# Patient Record
Sex: Female | Born: 1985 | Race: Black or African American | Hispanic: No | Smoking: Former smoker
Health system: Southern US, Community
[De-identification: ages and names within clinical notes are randomized; demographics above are authoritative.]

## PROBLEM LIST (undated history)

## (undated) ENCOUNTER — Inpatient Hospital Stay (HOSPITAL_COMMUNITY): Payer: Self-pay

## (undated) DIAGNOSIS — F329 Major depressive disorder, single episode, unspecified: Secondary | ICD-10-CM

## (undated) DIAGNOSIS — K5900 Constipation, unspecified: Secondary | ICD-10-CM

## (undated) DIAGNOSIS — F32A Depression, unspecified: Secondary | ICD-10-CM

## (undated) DIAGNOSIS — M2550 Pain in unspecified joint: Secondary | ICD-10-CM

## (undated) DIAGNOSIS — M25559 Pain in unspecified hip: Secondary | ICD-10-CM

## (undated) DIAGNOSIS — M549 Dorsalgia, unspecified: Secondary | ICD-10-CM

## (undated) DIAGNOSIS — R87619 Unspecified abnormal cytological findings in specimens from cervix uteri: Secondary | ICD-10-CM

## (undated) DIAGNOSIS — R0602 Shortness of breath: Secondary | ICD-10-CM

## (undated) DIAGNOSIS — Z789 Other specified health status: Secondary | ICD-10-CM

## (undated) DIAGNOSIS — M25569 Pain in unspecified knee: Secondary | ICD-10-CM

## (undated) DIAGNOSIS — IMO0002 Reserved for concepts with insufficient information to code with codable children: Secondary | ICD-10-CM

## (undated) DIAGNOSIS — E669 Obesity, unspecified: Secondary | ICD-10-CM

## (undated) DIAGNOSIS — K831 Obstruction of bile duct: Secondary | ICD-10-CM

## (undated) HISTORY — DX: Dorsalgia, unspecified: M54.9

## (undated) HISTORY — DX: Depression, unspecified: F32.A

## (undated) HISTORY — DX: Obstruction of bile duct: K83.1

## (undated) HISTORY — DX: Shortness of breath: R06.02

## (undated) HISTORY — DX: Constipation, unspecified: K59.00

## (undated) HISTORY — DX: Pain in unspecified joint: M25.50

## (undated) HISTORY — PX: ABDOMINAL SURGERY: SHX537

## (undated) HISTORY — DX: Pain in unspecified knee: M25.569

## (undated) HISTORY — PX: NO PAST SURGERIES: SHX2092

## (undated) HISTORY — DX: Pain in unspecified hip: M25.559

---
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
## 2006-09-09 ENCOUNTER — Emergency Department (HOSPITAL_COMMUNITY): Admission: EM | Admit: 2006-09-09 | Discharge: 2006-09-10 | Payer: Self-pay | Admitting: Emergency Medicine

## 2006-10-31 ENCOUNTER — Emergency Department (HOSPITAL_COMMUNITY): Admission: EM | Admit: 2006-10-31 | Discharge: 2006-11-01 | Payer: Self-pay | Admitting: Emergency Medicine

## 2009-07-11 HISTORY — PX: COLPOSCOPY: SHX161

## 2012-04-10 LAB — HM PAP SMEAR: HM Pap smear: NORMAL

## 2012-07-11 NOTE — L&D Delivery Note (Signed)
Delivery Note At 12:43 PM a viable female was delivered via Vaginal, Spontaneous Delivery (Presentation: ; Occiput Anterior).  APGAR: 9, 9; weight .   Placenta status: Intact, Spontaneous.  Cord: 3 vessels with the following complications: None.  Cord pH: not done  Anesthesia: None  Episiotomy: None Lacerations: 1st degree;Periurethral Suture Repair: not indicated Est. Blood Loss (mL): 250  Mom to postpartum.  Baby to nursery-stable. Brenda Navarro, Brenda Navarro 04/02/2013, 1:52 PM

## 2012-09-11 LAB — OB RESULTS CONSOLE GC/CHLAMYDIA
Chlamydia: NEGATIVE
Gonorrhea: NEGATIVE

## 2012-10-11 ENCOUNTER — Encounter: Payer: Self-pay | Admitting: Obstetrics

## 2012-10-19 ENCOUNTER — Ambulatory Visit (INDEPENDENT_AMBULATORY_CARE_PROVIDER_SITE_OTHER): Admitting: Obstetrics

## 2012-10-19 ENCOUNTER — Encounter: Payer: Self-pay | Admitting: Obstetrics

## 2012-10-19 VITALS — BP 123/79 | Temp 98.2°F | Ht 64.0 in | Wt 230.0 lb

## 2012-10-19 DIAGNOSIS — Z3480 Encounter for supervision of other normal pregnancy, unspecified trimester: Secondary | ICD-10-CM

## 2012-10-19 NOTE — Progress Notes (Signed)
Doing well 

## 2012-10-19 NOTE — Progress Notes (Signed)
Pulse: 75

## 2012-10-20 LAB — HIV ANTIBODY (ROUTINE TESTING W REFLEX): HIV: NONREACTIVE

## 2012-10-20 LAB — CULTURE, OB URINE
Colony Count: NO GROWTH
Organism ID, Bacteria: NO GROWTH

## 2012-10-20 LAB — VITAMIN D 25 HYDROXY (VIT D DEFICIENCY, FRACTURES): Vit D, 25-Hydroxy: 19 ng/mL — ABNORMAL LOW (ref 30–89)

## 2012-10-22 LAB — AFP, QUAD SCREEN
AFP: 33.9 IU/mL
Age Alone: 1:1030 {titer}
Curr Gest Age: 16.6 wks.days
Down Syndrome Scr Risk Est: 1:13300 {titer}
HCG, Total: 16561 m[IU]/mL
INH: 220 pg/mL
Interpretation-AFP: NEGATIVE
MoM for AFP: 1.42
MoM for INH: 1.5
MoM for hCG: 1.09
Open Spina bifida: NEGATIVE
Osb Risk: 1:3550 {titer}
Tri 18 Scr Risk Est: NEGATIVE
Trisomy 18 (Edward) Syndrome Interp.: 1:12500 {titer}
uE3 Mom: 0.58
uE3 Value: 0.3 ng/mL

## 2012-10-22 LAB — OBSTETRIC PANEL
Antibody Screen: NEGATIVE
Basophils Absolute: 0 10*3/uL (ref 0.0–0.1)
Basophils Relative: 0 % (ref 0–1)
Eosinophils Absolute: 0.1 10*3/uL (ref 0.0–0.7)
Eosinophils Relative: 1 % (ref 0–5)
HCT: 38.4 % (ref 36.0–46.0)
Hemoglobin: 12.5 g/dL (ref 12.0–15.0)
Hepatitis B Surface Ag: NEGATIVE
Lymphocytes Relative: 26 % (ref 12–46)
Lymphs Abs: 2 10*3/uL (ref 0.7–4.0)
MCH: 27.8 pg (ref 26.0–34.0)
MCHC: 32.6 g/dL (ref 30.0–36.0)
MCV: 85.3 fL (ref 78.0–100.0)
Monocytes Absolute: 0.5 10*3/uL (ref 0.1–1.0)
Monocytes Relative: 6 % (ref 3–12)
Neutro Abs: 5.3 10*3/uL (ref 1.7–7.7)
Neutrophils Relative %: 67 % (ref 43–77)
Platelets: 289 10*3/uL (ref 150–400)
RBC: 4.5 MIL/uL (ref 3.87–5.11)
RDW: 14 % (ref 11.5–15.5)
Rh Type: POSITIVE
Rubella: 3.35 Index — ABNORMAL HIGH (ref <0.90)
WBC: 7.9 10*3/uL (ref 4.0–10.5)

## 2012-10-22 LAB — VARICELLA ZOSTER ANTIBODY, IGG: Varicella IgG: 236.7 Index — ABNORMAL HIGH (ref <135.00)

## 2012-10-24 ENCOUNTER — Encounter (HOSPITAL_COMMUNITY): Payer: Self-pay | Admitting: Family Medicine

## 2012-10-24 ENCOUNTER — Emergency Department (HOSPITAL_COMMUNITY)

## 2012-10-24 ENCOUNTER — Emergency Department (HOSPITAL_COMMUNITY)
Admission: EM | Admit: 2012-10-24 | Discharge: 2012-10-24 | Disposition: A | Discharge status: Home / Self Care | Attending: Emergency Medicine | Admitting: Emergency Medicine

## 2012-10-24 DIAGNOSIS — Y9289 Other specified places as the place of occurrence of the external cause: Secondary | ICD-10-CM | POA: Insufficient documentation

## 2012-10-24 DIAGNOSIS — R269 Unspecified abnormalities of gait and mobility: Secondary | ICD-10-CM | POA: Insufficient documentation

## 2012-10-24 DIAGNOSIS — R209 Unspecified disturbances of skin sensation: Secondary | ICD-10-CM | POA: Insufficient documentation

## 2012-10-24 DIAGNOSIS — Z3490 Encounter for supervision of normal pregnancy, unspecified, unspecified trimester: Secondary | ICD-10-CM

## 2012-10-24 DIAGNOSIS — Z79899 Other long term (current) drug therapy: Secondary | ICD-10-CM | POA: Insufficient documentation

## 2012-10-24 DIAGNOSIS — X500XXA Overexertion from strenuous movement or load, initial encounter: Secondary | ICD-10-CM | POA: Insufficient documentation

## 2012-10-24 DIAGNOSIS — S93409A Sprain of unspecified ligament of unspecified ankle, initial encounter: Secondary | ICD-10-CM | POA: Insufficient documentation

## 2012-10-24 DIAGNOSIS — S3981XA Other specified injuries of abdomen, initial encounter: Secondary | ICD-10-CM | POA: Insufficient documentation

## 2012-10-24 DIAGNOSIS — W108XXA Fall (on) (from) other stairs and steps, initial encounter: Secondary | ICD-10-CM | POA: Insufficient documentation

## 2012-10-24 DIAGNOSIS — O9989 Other specified diseases and conditions complicating pregnancy, childbirth and the puerperium: Secondary | ICD-10-CM | POA: Insufficient documentation

## 2012-10-24 DIAGNOSIS — Y939 Activity, unspecified: Secondary | ICD-10-CM | POA: Insufficient documentation

## 2012-10-24 DIAGNOSIS — R110 Nausea: Secondary | ICD-10-CM | POA: Insufficient documentation

## 2012-10-24 LAB — URINE MICROSCOPIC-ADD ON

## 2012-10-24 LAB — URINALYSIS, ROUTINE W REFLEX MICROSCOPIC
Bilirubin Urine: NEGATIVE
Glucose, UA: NEGATIVE mg/dL
Ketones, ur: 15 mg/dL — AB
Leukocytes, UA: NEGATIVE
Nitrite: NEGATIVE
Protein, ur: NEGATIVE mg/dL
Specific Gravity, Urine: 1.031 — ABNORMAL HIGH (ref 1.005–1.030)
Urobilinogen, UA: 1 mg/dL (ref 0.0–1.0)
pH: 5.5 (ref 5.0–8.0)

## 2012-10-24 NOTE — ED Notes (Signed)
Patient declined wet prep and GC/Chlamydia

## 2012-10-24 NOTE — ED Provider Notes (Signed)
History     CSN: [id number]  Arrival date & time 10/24/12  1403   First MD Initiated Contact with Patient 10/24/12 1550      Chief Complaint  Patient presents with  . Fall    (Consider location/radiation/quality/duration/timing/severity/associated sxs/prior treatment) HPI  Pt is a 27 yo F G2P1 presenting after tripping down 2-3 stairs and landing on her abdomen, also injuring her right ankle during the fall. Pt is currently [redacted]wks pregnant w/o any difficulty during her pregnancy thus far. Pt states the ankle was the initial reason for coming to the ED she had sharp pain on the lateral portion of her foot and was unable to ambulate on the ankle immediately after the incident. She has pain in her fourth and fifth toes w/ some numbness in the tip of her fourth toe. Weight bearing aggravates the pain from 7/10 to 9/10. Rest and elevation alleviate pain. No prior history of injury to ankle. While waiting in the ED waiting room patient states she felt some light cramping in her lower abdomen w/o radiation. Has some minor associated nausea. Denies any vaginal bleeding or vaginal pain. Denies LOC, headache, fevers, chills, vomiting, weakness, lightheadedness. Denies any safety issues or concerns at home. Feels safe.   History reviewed. No pertinent past medical history.  History reviewed. No pertinent past surgical history.  History reviewed. No pertinent family history.  History  Substance Use Topics  . Smoking status: Not on file  . Smokeless tobacco: Not on file  . Alcohol Use: Not on file    OB History   Grav Para Term Preterm Abortions TAB SAB Ect Mult Living   2 1 1       1       Review of Systems  Constitutional: Negative.   HENT: Negative.   Eyes: Negative.   Respiratory: Negative.   Cardiovascular: Negative.   Gastrointestinal: Positive for nausea.  Genitourinary: Negative.   Musculoskeletal: Positive for gait problem.  Skin:       Ankle bruising  Neurological:  Negative.   Psychiatric/Behavioral: Negative.     Allergies  Review of patient's allergies indicates no known allergies.  Home Medications   Current Outpatient Rx  Name  Route  Sig  Dispense  Refill  . Prenatal Vit-Fe Fumarate-FA (PRENATAL FORMULA PO)   Oral   Take 1 tablet by mouth daily.           BP 137/69  Pulse 104  Temp(Src) 98.3 F (36.8 C)  Resp 18  SpO2 100%  Physical Exam  Constitutional: She is oriented to person, place, and time. She appears well-developed and well-nourished. No distress.  HENT:  Head: Normocephalic and atraumatic.  Eyes: Conjunctivae are normal. Pupils are equal, round, and reactive to light.  Neck: Neck supple.  Cardiovascular: Normal rate, regular rhythm, normal heart sounds and intact distal pulses.   Abdominal: Soft. Bowel sounds are normal.  Musculoskeletal:       Right ankle: She exhibits decreased range of motion, swelling and ecchymosis. She exhibits no deformity, no laceration and normal pulse. Tenderness. No head of 5th metatarsal and no proximal fibula tenderness found. Achilles tendon normal.       Left ankle: Normal.       Feet:  Neurological: She is alert and oriented to person, place, and time.  Skin: Skin is warm and dry. She is not diaphoretic.   Exam performed by Francee Piccolo L,  exam chaperoned Date: 10/24/2012 Pelvic exam: normal external genitalia without evidence of  trauma. VULVA: normal appearing vulva with no masses, tenderness or lesion. VAGINA: normal appearing vagina with normal color and discharge, no lesions. CERVIX: normal appearing cervix without lesions, cervical motion tenderness absent, cervical os closed with out purulent discharge.   ADNEXA: normal adnexa in size, nontender and no masses UTERUS: uterus is normal size for [redacted] weeks pregnant, shape, consistency and nontender.   GC/Chlymadia and Wet prep declined per patient as she had them done and other routine testing done at last Ob/Gyn  appointment.   ED Course  Procedures (including critical care time)  Dr. Clearance Coots of [facility] contacted about patient's ED visit and notified of US findings. Advised to have patient follow up this week.   Labs Reviewed - No data to display Dg Ankle Complete Right  10/24/2012  *RADIOLOGY REPORT*  Clinical Data: Twisting injury, pain.  RIGHT ANKLE - COMPLETE 3+ VIEW  Comparison: None.  Findings: Imaged bones, joints and soft tissues appear normal.  IMPRESSION: Negative exam.   Original Report Authenticated By: Holley Dexter, M.D.    US Ob Limited  10/24/2012  *RADIOLOGY REPORT*  Clinical Data: Cramping abdominal pain after fall.  LIMITED OBSTETRIC ULTRASOUND  Number of Fetuses: 1 Heart Rate: 152 bpm Movement: Yes Presentation: Cephalic Placental Location: Anterior Previa: No Amniotic Fluid (Subjective): Normal  Vertical Pocket 4.9 cm  BPD:  3.94 cm     18 w  0 d         EDC:  03/27/2013  IMPRESSION: Intrauterine pregnancy, 18 weeks and 0 days gestation.  No complications are visible.  Recommend followup with non-emergent complete OB 14+ wk US examination for fetal biometric evaluation and anatomic survey if not already performed.   Original Report Authenticated By: Francene Boyers, M.D.    Dg Foot Complete Right  10/24/2012  *RADIOLOGY REPORT*  Clinical Data: Pain and swelling of the lateral aspect of the right foot after the patient fell down stairs today.  RIGHT FOOT COMPLETE - 3+ VIEW  Comparison: None.  Findings: There is no fracture, dislocation, or joint effusion.  No arthritis.  IMPRESSION: Normal exam.   Original Report Authenticated By: Francene Boyers, M.D.      1. Ankle sprain and strain, right, initial encounter   2. Pregnancy       MDM  Patient is a 27 yo F G2P1 presenting to ED after a mechanical fall down the stairs this afternoon. Patient states she twisted her ankle and fell on her abdomen. No vaginal bleeding. Pelvic exam wnl. US showed no complications.  Obstetrician was notified of visit. X-ray showed no acute bony injuries to ankle. Pt adivsed to stay off ankle. Given crutches and RICE protocol. Advised to follow up with PCP and OB/GYN.Patient agreeable to plan. Patient is stable at time of discharge. Patient d/w with Dr. Clarene Duke, agrees with plan.           Jeannetta Ellis, PA-C 10/25/12 2028

## 2012-10-24 NOTE — ED Notes (Addendum)
Per pt sts tripped and fell down a few stairs. sts right ankle pain. Denies abdominal pain but wants to be checked out.  Pt is [redacted] weeks pregnant

## 2012-10-24 NOTE — Progress Notes (Signed)
Orthopedic Tech Progress Note Patient Details:  Brenda Navarro Dec 05, 1985 [id number]  Ortho Devices Type of Ortho Device: CAM Abbasi;Crutches Ortho Device/Splint Interventions: Ordered;Application;Adjustment   Jennye Moccasin 10/24/2012, 6:15 PM

## 2012-10-24 NOTE — ED Notes (Signed)
This RN paged the Ortho tech to fit the pt for cam Folds and crutches.

## 2012-10-24 NOTE — Discharge Instructions (Signed)
Please follow up with your PCP for your ankle sprain. Please follow RICE protocol listed below. Please call Dr. Clearance Coots for follow up appointment he would like you to schedule an appointment. Please read all discharge instructions and return precautions.   Ankle Sprain An ankle sprain is an injury to the strong, fibrous tissues (ligaments) that hold the bones of your ankle joint together.  CAUSES An ankle sprain is usually caused by a fall or by twisting your ankle. Ankle sprains most commonly occur when you step on the outer edge of your foot, and your ankle turns inward. People who participate in sports are more prone to these types of injuries.  SYMPTOMS   Pain in your ankle. The pain may be present at rest or only when you are trying to stand or walk.  Swelling.  Bruising. Bruising may develop immediately or within 1 to 2 days after your injury.  Difficulty standing or walking, particularly when turning corners or changing directions. DIAGNOSIS  Your caregiver will ask you details about your injury and perform a physical exam of your ankle to determine if you have an ankle sprain. During the physical exam, your caregiver will press on and apply pressure to specific areas of your foot and ankle. Your caregiver will try to move your ankle in certain ways. An X-ray exam may be done to be sure a bone was not broken or a ligament did not separate from one of the bones in your ankle (avulsion fracture).  TREATMENT  Certain types of braces can help stabilize your ankle. Your caregiver can make a recommendation for this. Your caregiver may recommend the use of medicine for pain. If your sprain is severe, your caregiver may refer you to a surgeon who helps to restore function to parts of your skeletal system (orthopedist) or a physical therapist. HOME CARE INSTRUCTIONS   Apply ice to your injury for 1 to 2 days or as directed by your caregiver. Applying ice helps to reduce inflammation and  pain.  Put ice in a plastic bag.  Place a towel between your skin and the bag.  Leave the ice on for 15 to 20 minutes at a time, every 2 hours while you are awake.  Only take over-the-counter or prescription medicines for pain, discomfort, or fever as directed by your caregiver.  Keep your injured leg elevated, when possible, to lessen swelling.  If your caregiver recommends crutches, use them as instructed. Gradually put weight on the affected ankle. Continue to use crutches or a cane until you can walk without feeling pain in your ankle.  If you have a plaster splint, wear the splint as directed by your caregiver. Do not rest it on anything harder than a pillow for the first 24 hours. Do not put weight on it. Do not get it wet. You may take it off to take a shower or bath.  You may have been given an elastic bandage to wear around your ankle to provide support. If the elastic bandage is too tight (you have numbness or tingling in your foot or your foot becomes cold and blue), adjust the bandage to make it comfortable.  If you have an air splint, you may blow more air into it or let air out to make it more comfortable. You may take your splint off at night and before taking a shower or bath.  Wiggle your toes in the splint several times per day to decrease swelling. SEEK MEDICAL CARE IF:  You have an increase in bruising, swelling, or pain.  Your toes feel extremely cold or you lose feeling in your foot.  Your pain is not relieved with medicine. SEEK IMMEDIATE MEDICAL CARE IF:  Your toes are numb or blue.  You have severe pain. MAKE SURE YOU:   Understand these instructions.  Will watch your condition.  Will get help right away if you are not doing well or get worse. Document Released: 06/27/2005 Document Revised: 09/19/2011 Document Reviewed: 07/09/2011 [facility] Patient Information 2013 [place], [place].   RICE: Routine Care for Injuries The routine care of many  injuries includes Rest, Ice, Compression, and Elevation (RICE). HOME CARE INSTRUCTIONS  Rest is needed to allow your body to heal. Routine activities can usually be resumed when comfortable. Injured tendons and bones can take up to 6 weeks to heal. Tendons are the cord-like structures that attach muscle to bone.  Ice following an injury helps keep the swelling down and reduces pain.  Put ice in a plastic bag.  Place a towel between your skin and the bag.  Leave the ice on for 15 to 20 minutes, 3 to 4 times a day. Do this while awake, for the first 24 to 48 hours. After that, continue as directed by your caregiver.  Compression helps keep swelling down. It also gives support and helps with discomfort. If an elastic bandage has been applied, it should be removed and reapplied every 3 to 4 hours. It should not be applied tightly, but firmly enough to keep swelling down. Watch fingers or toes for swelling, bluish discoloration, coldness, numbness, or excessive pain. If any of these problems occur, remove the bandage and reapply loosely. Contact your caregiver if these problems continue.  Elevation helps reduce swelling and decreases pain. With extremities, such as the arms, hands, legs, and feet, the injured area should be placed near or above the level of the heart, if possible. SEEK IMMEDIATE MEDICAL CARE IF:  You have persistent pain and swelling.  You develop redness, numbness, or unexpected weakness.  Your symptoms are getting worse rather than improving after several days. These symptoms may indicate that further evaluation or further X-rays are needed. Sometimes, X-rays may not show a small broken bone (fracture) until 1 week or 10 days later. Make a follow-up appointment with your caregiver. Ask when your X-ray results will be ready. Make sure you get your X-ray results. Document Released: 10/09/2000 Document Revised: 09/19/2011 Document Reviewed: 11/26/2010 [facility] Patient Information  2013 [place], [place].   Pregnancy If you are planning on getting pregnant, it is a good idea to make a preconception appointment with your caregiver to discuss having a healthy lifestyle before getting pregnant. This includes diet, weight, exercise, taking prenatal vitamins (especially folic acid, which helps prevent brain and spinal cord defects), avoiding alcohol, smoking and illegal drugs, medical problems (diabetes, convulsions), family history of genetic problems, working conditions, and immunizations. It is better to have knowledge of these things and do something about them before getting pregnant. During your pregnancy, it is important to follow certain guidelines in order to have a healthy baby. It is very important to get good prenatal care and follow your caregiver's instructions. Prenatal care includes all the medical care you receive before your baby's birth. This helps to prevent problems during the pregnancy and childbirth. HOME CARE INSTRUCTIONS   Start your prenatal visits by the 12th week of pregnancy or earlier, if possible. At first, appointments are usually scheduled monthly. They become more frequent  in the last 2 months before delivery. It is important that you keep your caregiver's appointments and follow your caregiver's instructions regarding medication use, exercise, and diet.  During pregnancy, you are providing food for you and your baby. Eat a regular, well-balanced diet. Choose foods such as meat, fish, milk and other dairy products, vegetables, fruits, whole-grain breads and cereals. Your caregiver will inform you of the ideal weight gain depending on your current height and weight. Drink lots of liquids. Try to drink 8 glasses of water a day.  Alcohol is associated with a number of birth defects including fetal alcohol syndrome. It is best to avoid alcohol completely. Smoking will cause low birth rate and prematurity. Use of alcohol and nicotine during your pregnancy also  increases the chances that your child will be chemically dependent later in their life and may contribute to SIDS (Sudden Infant Death Syndrome).  Do not use illegal drugs.  Only take prescription or over-the-counter medications that are recommended by your caregiver. Other medications can cause genetic and physical problems in the baby.  Morning sickness can often be helped by keeping soda crackers at the bedside. Eat a few before getting up in the morning.  A sexual relationship may be continued until near the end of pregnancy if there are no other problems such as early (premature) leaking of amniotic fluid from the membranes, vaginal bleeding, painful intercourse or belly (abdominal) pain.  Exercise regularly. Check with your caregiver if you are unsure of the safety of some of your exercises.  Do not use hot tubs, steam rooms or saunas. These increase the risk of fainting and hurting yourself and the baby. Swimming is OK for exercise. Get plenty of rest, including afternoon naps when possible, especially in the third trimester.  Avoid toxic odors and chemicals.  Do not wear high heels. They may cause you to lose your balance and fall.  Do not lift over 5 pounds. If you do lift anything, lift with your legs and thighs, not your back.  Avoid long trips, especially in the third trimester.  If you have to travel out of the city or state, take a copy of your medical records with you. SEEK IMMEDIATE MEDICAL CARE IF:   You develop an unexplained oral temperature above 102 F (38.9 C), or as your caregiver suggests.  You have leaking of fluid from the vagina. If leaking membranes are suspected, take your temperature and inform your caregiver of this when you call.  There is vaginal spotting or bleeding. Notify your caregiver of the amount and how many pads are used.  You continue to feel sick to your stomach (nauseous) and have no relief from remedies suggested, or you throw up (vomit)  blood or coffee ground like materials.  You develop upper abdominal pain.  You have round ligament discomfort in the lower abdominal area. This still must be evaluated by your caregiver.  You feel contractions of the uterus.  You do not feel the baby move, or there is less movement than before.  You have painful urination.  You have abnormal vaginal discharge.  You have persistent diarrhea.  You get a severe headache.  You have problems with your vision.  You develop muscle weakness.  You feel dizzy and faint.  You develop shortness of breath.  You develop chest pain.  You have back pain that travels down to your leg and feet.  You feel irregular or a very fast heartbeat.  You develop excessive weight gain  in a short period of time (5 pounds in 3 to 5 days).  You are involved in a domestic violence situation. Document Released: 06/27/2005 Document Revised: 12/27/2011 Document Reviewed: 12/19/2008 [facility] Patient Information 2013 [place], [place].

## 2012-10-25 LAB — URINE CULTURE
Colony Count: NO GROWTH
Culture: NO GROWTH

## 2012-10-27 NOTE — ED Provider Notes (Signed)
Medical screening examination/treatment/procedure(s) were performed by non-physician practitioner and as supervising physician I was immediately available for consultation/collaboration.   Laray Anger, DO 10/27/12 [phone]

## 2012-11-02 ENCOUNTER — Encounter: Payer: Self-pay | Admitting: Obstetrics

## 2012-11-09 ENCOUNTER — Encounter: Payer: Self-pay | Admitting: *Deleted

## 2012-11-14 ENCOUNTER — Ambulatory Visit (INDEPENDENT_AMBULATORY_CARE_PROVIDER_SITE_OTHER): Admitting: Obstetrics

## 2012-11-14 ENCOUNTER — Encounter: Payer: Self-pay | Admitting: Obstetrics

## 2012-11-14 ENCOUNTER — Ambulatory Visit (INDEPENDENT_AMBULATORY_CARE_PROVIDER_SITE_OTHER)

## 2012-11-14 VITALS — BP 124/75 | Temp 98.3°F | Wt 228.0 lb

## 2012-11-14 DIAGNOSIS — Z3480 Encounter for supervision of other normal pregnancy, unspecified trimester: Secondary | ICD-10-CM

## 2012-11-14 DIAGNOSIS — Z3482 Encounter for supervision of other normal pregnancy, second trimester: Secondary | ICD-10-CM

## 2012-11-14 DIAGNOSIS — Z1389 Encounter for screening for other disorder: Secondary | ICD-10-CM

## 2012-11-14 DIAGNOSIS — Z363 Encounter for antenatal screening for malformations: Secondary | ICD-10-CM

## 2012-11-14 LAB — POCT URINALYSIS DIPSTICK
Bilirubin, UA: NEGATIVE
Blood, UA: NEGATIVE
Glucose, UA: NEGATIVE
Ketones, UA: NEGATIVE
Nitrite, UA: NEGATIVE
Spec Grav, UA: 1.015
Urobilinogen, UA: NEGATIVE
pH, UA: 7.5

## 2012-11-14 NOTE — Progress Notes (Signed)
Pulse-76 No complaints.

## 2012-11-15 ENCOUNTER — Encounter: Payer: Self-pay | Admitting: Obstetrics

## 2012-11-15 LAB — US OB DETAIL + 14 WK

## 2012-12-08 ENCOUNTER — Inpatient Hospital Stay (HOSPITAL_COMMUNITY)
Admission: AD | Admit: 2012-12-08 | Discharge: 2012-12-08 | Disposition: A | Discharge status: Home / Self Care | Source: Ambulatory Visit | Attending: Obstetrics | Admitting: Obstetrics

## 2012-12-08 ENCOUNTER — Encounter (HOSPITAL_COMMUNITY): Payer: Self-pay

## 2012-12-08 DIAGNOSIS — R109 Unspecified abdominal pain: Secondary | ICD-10-CM

## 2012-12-08 DIAGNOSIS — O26899 Other specified pregnancy related conditions, unspecified trimester: Secondary | ICD-10-CM

## 2012-12-08 DIAGNOSIS — O99891 Other specified diseases and conditions complicating pregnancy: Secondary | ICD-10-CM | POA: Insufficient documentation

## 2012-12-08 LAB — URINE MICROSCOPIC-ADD ON

## 2012-12-08 LAB — URINALYSIS, ROUTINE W REFLEX MICROSCOPIC
Bilirubin Urine: NEGATIVE
Glucose, UA: NEGATIVE mg/dL
Ketones, ur: 40 mg/dL — AB
Leukocytes, UA: NEGATIVE
Nitrite: NEGATIVE
Protein, ur: NEGATIVE mg/dL
Specific Gravity, Urine: 1.025 (ref 1.005–1.030)
Urobilinogen, UA: 2 mg/dL — ABNORMAL HIGH (ref 0.0–1.0)
pH: 6.5 (ref 5.0–8.0)

## 2012-12-08 NOTE — MAU Note (Signed)
I was sitting and when I got up felt like a pop and then started cramping. Sometimes pain is bad and sometimes not so bad. This started about 2 hours ago. Some mucousy, greenish d/c

## 2012-12-08 NOTE — MAU Provider Note (Signed)
History     CSN: [id number]  Arrival date and time: 12/08/12 2135   First Provider Initiated Contact with Patient 12/08/12 2214      Chief Complaint  Patient presents with  . Abdominal Cramping   HPI  Pt is a G2P1001 at 24.0 wks IUP here with report of abdominal cramping that started today around 2000.  No recent intercourse, leaking of fluid, or vaginal bleeding.  Pain is described as "severe menstrual cramps".  +fetal movement.  No report of problems in current pregnancy.    No past medical history on file.  History reviewed. No pertinent past surgical history.  No family history on file.  History  Substance Use Topics  . Smoking status: Current Every Day Smoker -- 0.50 packs/day for 10 years    Types: Cigarettes  . Smokeless tobacco: Not on file  . Alcohol Use: No    Allergies: No Known Allergies  Prescriptions prior to admission  Medication Sig Dispense Refill  . calcium carbonate (TUMS - DOSED IN MG ELEMENTAL CALCIUM) 500 MG chewable tablet Chew 2 tablets by mouth daily as needed for heartburn.      . Prenatal Vit-Fe Fumarate-FA (PRENATAL MULTIVITAMIN) TABS Take 1 tablet by mouth daily at 12 noon.        Review of Systems  Constitutional: Negative for fever and chills.  Gastrointestinal: Positive for abdominal pain (cramping). Negative for diarrhea and constipation.  Genitourinary: Negative for dysuria, urgency, frequency and hematuria.       Long mucus-like discharge this am  Neurological: Negative for headaches.  All other systems reviewed and are negative.   Physical Exam   Blood pressure 122/65, pulse 90, temperature 98.4 F (36.9 C), resp. rate 18, height 5\' 3"  (1.6 m), weight 102.331 kg (225 lb 9.6 oz), SpO2 98.00%.  Physical Exam  Constitutional: She is oriented to person, place, and time. She appears well-developed and well-nourished. No distress.  HENT:  Head: Normocephalic.  Neck: Normal range of motion. Neck supple.  Cardiovascular: Normal  rate, regular rhythm and normal heart sounds.   Respiratory: Effort normal and breath sounds normal.  GI: Soft. There is no tenderness.  Genitourinary: No bleeding around the vagina. Vaginal discharge (mucusy) found.  Neurological: She is alert and oriented to person, place, and time.  Skin: Skin is warm and dry.   Dilation: Closed Effacement (%): Thick Cervical Position: Posterior Station: Ballotable Exam by:: W. Muhammmad, CNM  FHR 150's, +accels Toco - none MAU Course  Procedures  Results for orders placed during the hospital encounter of 12/08/12 (from the past 24 hour(s))  URINALYSIS, ROUTINE W REFLEX MICROSCOPIC     Status: Abnormal   Collection Time    12/08/12  9:44 PM      Result Value Range   Color, Urine YELLOW  YELLOW   APPearance CLEAR  CLEAR   Specific Gravity, Urine 1.025  1.005 - 1.030   pH 6.5  5.0 - 8.0   Glucose, UA NEGATIVE  NEGATIVE mg/dL   Hgb urine dipstick TRACE (*) NEGATIVE   Bilirubin Urine NEGATIVE  NEGATIVE   Ketones, ur 40 (*) NEGATIVE mg/dL   Protein, ur NEGATIVE  NEGATIVE mg/dL   Urobilinogen, UA 2.0 (*) 0.0 - 1.0 mg/dL   Nitrite NEGATIVE  NEGATIVE   Leukocytes, UA NEGATIVE  NEGATIVE  URINE MICROSCOPIC-ADD ON     Status: Abnormal   Collection Time    12/08/12  9:44 PM      Result Value Range   Squamous Epithelial /  LPF FEW (*) RARE   WBC, UA 0-2  <3 WBC/hpf   RBC / HPF 0-2  <3 RBC/hpf   Bacteria, UA FEW (*) RARE   Urine-Other MUCOUS PRESENT      Assessment and Plan  Abdominal Pain in Pregnancy - Normal Exam  Plan: DC to home Provided Reassurance Increase fluids Keep scheduled appointment  [facility] 12/08/2012, 10:15 PM

## 2012-12-08 NOTE — Discharge Instructions (Signed)
Abdominal Pain During Pregnancy  Abdominal discomfort is common in pregnancy. Most of the time, it does not cause harm. There are many causes of abdominal pain. Some causes are more serious than others. Some of the causes of abdominal pain in pregnancy are easily diagnosed. Occasionally, the diagnosis takes time to understand. Other times, the cause is not determined. Abdominal pain can be a sign that something is very wrong with the pregnancy, or the pain may have nothing to do with the pregnancy at all. For this reason, always tell your caregiver if you have any abdominal discomfort.  CAUSES  Common and harmless causes of abdominal pain include:   Constipation.   Excess gas and bloating.   Round ligament pain. This is pain that is felt in the folds of the groin.   The position the baby or placenta is in.   Baby kicks.   Braxton-Hicks contractions. These are mild contractions that do not cause cervical dilation.  Serious causes of abdominal pain include:   Ectopic pregnancy. This happens when a fertilized egg implants outside of the uterus.   Miscarriage.   Preterm labor. This is when labor starts at less than 37 weeks of pregnancy.   Placental abruption. This is when the placenta partially or completely separates from the uterus.   Preeclampsia. This is often associated with high blood pressure and has been referred to as "toxemia in pregnancy."   Uterine or amniotic fluid infections.  Causes unrelated to pregnancy include:   Urinary tract infection.   Gallbladder stones or inflammation.   Hepatitis or other liver illness.   Intestinal problems, stomach flu, food poisoning, or ulcer.   Appendicitis.   Kidney (renal) stones.   Kidney infection (pylonephritis).  HOME CARE INSTRUCTIONS   For mild pain:   Do not have sexual intercourse or put anything in your vagina until your symptoms go away completely.   Get plenty of rest until your pain improves. If your pain does not improve in 1 hour, call  your caregiver.   Drink clear fluids if you feel nauseous. Avoid solid food as long as you are uncomfortable or nauseous.   Only take medicine as directed by your caregiver.   Keep all follow-up appointments with your caregiver.  SEEK IMMEDIATE MEDICAL CARE IF:   You are bleeding, leaking fluid, or passing tissue from the vagina.   You have increasing pain or cramping.   You have persistent vomiting.   You have painful or bloody urination.   You have a fever.   You notice a decrease in your baby's movements.   You have extreme weakness or feel faint.   You have shortness of breath, with or without abdominal pain.   You develop a severe headache with abdominal pain.   You have abnormal vaginal discharge with abdominal pain.   You have persistent diarrhea.   You have abdominal pain that continues even after rest, or gets worse.  MAKE SURE YOU:    Understand these instructions.   Will watch your condition.   Will get help right away if you are not doing well or get worse.  Document Released: 06/27/2005 Document Revised: 09/19/2011 Document Reviewed: 01/21/2011  [employer] Patient Information 2014 [employer].

## 2012-12-10 ENCOUNTER — Encounter: Admitting: Obstetrics

## 2012-12-10 ENCOUNTER — Other Ambulatory Visit

## 2012-12-27 ENCOUNTER — Other Ambulatory Visit

## 2012-12-27 ENCOUNTER — Encounter: Payer: Self-pay | Admitting: Obstetrics

## 2012-12-27 ENCOUNTER — Ambulatory Visit (INDEPENDENT_AMBULATORY_CARE_PROVIDER_SITE_OTHER): Admitting: Obstetrics

## 2012-12-27 VITALS — BP 110/73 | Temp 98.1°F | Wt 223.2 lb

## 2012-12-27 DIAGNOSIS — Z3480 Encounter for supervision of other normal pregnancy, unspecified trimester: Secondary | ICD-10-CM

## 2012-12-27 DIAGNOSIS — Z3482 Encounter for supervision of other normal pregnancy, second trimester: Secondary | ICD-10-CM

## 2012-12-27 LAB — POCT URINALYSIS DIPSTICK
Bilirubin, UA: NEGATIVE
Blood, UA: NEGATIVE
Glucose, UA: NEGATIVE
Nitrite, UA: NEGATIVE
Spec Grav, UA: 1.015
Urobilinogen, UA: NEGATIVE
pH, UA: 7

## 2012-12-27 LAB — CBC
HCT: 36 % (ref 36.0–46.0)
Hemoglobin: 11.6 g/dL — ABNORMAL LOW (ref 12.0–15.0)
MCH: 27.7 pg (ref 26.0–34.0)
MCHC: 32.2 g/dL (ref 30.0–36.0)
MCV: 85.9 fL (ref 78.0–100.0)
Platelets: 276 10*3/uL (ref 150–400)
RBC: 4.19 MIL/uL (ref 3.87–5.11)
RDW: 15.2 % (ref 11.5–15.5)
WBC: 8.3 10*3/uL (ref 4.0–10.5)

## 2012-12-27 NOTE — Progress Notes (Signed)
Pulse: 90

## 2012-12-28 LAB — HIV ANTIBODY (ROUTINE TESTING W REFLEX): HIV: NONREACTIVE

## 2012-12-28 LAB — RPR

## 2012-12-28 LAB — GLUCOSE TOLERANCE, 2 HOURS W/ 1HR
Glucose, 1 hour: 123 mg/dL (ref 70–170)
Glucose, 2 hour: 103 mg/dL (ref 70–139)
Glucose, Fasting: 95 mg/dL (ref 70–99)

## 2013-01-15 ENCOUNTER — Encounter: Admitting: Obstetrics

## 2013-01-16 ENCOUNTER — Ambulatory Visit (INDEPENDENT_AMBULATORY_CARE_PROVIDER_SITE_OTHER): Admitting: Obstetrics

## 2013-01-16 ENCOUNTER — Encounter: Payer: Self-pay | Admitting: Obstetrics

## 2013-01-16 VITALS — BP 110/74 | Wt 223.8 lb

## 2013-01-16 DIAGNOSIS — Z3482 Encounter for supervision of other normal pregnancy, second trimester: Secondary | ICD-10-CM

## 2013-01-16 DIAGNOSIS — Z3480 Encounter for supervision of other normal pregnancy, unspecified trimester: Secondary | ICD-10-CM

## 2013-01-16 DIAGNOSIS — Z3483 Encounter for supervision of other normal pregnancy, third trimester: Secondary | ICD-10-CM

## 2013-01-16 LAB — POCT URINALYSIS DIPSTICK
Blood, UA: NEGATIVE
Glucose, UA: NEGATIVE
Ketones, UA: NEGATIVE
Leukocytes, UA: NEGATIVE
Nitrite, UA: NEGATIVE
Spec Grav, UA: 1.01
pH, UA: 7

## 2013-01-16 NOTE — Progress Notes (Signed)
Pulse: 93

## 2013-01-30 ENCOUNTER — Encounter: Payer: Self-pay | Admitting: Obstetrics

## 2013-01-30 ENCOUNTER — Ambulatory Visit (INDEPENDENT_AMBULATORY_CARE_PROVIDER_SITE_OTHER): Admitting: Obstetrics

## 2013-01-30 VITALS — BP 114/74 | Temp 98.4°F | Wt 221.4 lb

## 2013-01-30 DIAGNOSIS — Z3483 Encounter for supervision of other normal pregnancy, third trimester: Secondary | ICD-10-CM

## 2013-01-30 DIAGNOSIS — Z3480 Encounter for supervision of other normal pregnancy, unspecified trimester: Secondary | ICD-10-CM

## 2013-01-30 LAB — POCT URINALYSIS DIPSTICK
Bilirubin, UA: NEGATIVE
Blood, UA: NEGATIVE
Glucose, UA: NEGATIVE
Ketones, UA: NEGATIVE
Nitrite, UA: NEGATIVE
Protein, UA: NEGATIVE
Spec Grav, UA: 1.015
Urobilinogen, UA: NEGATIVE
pH, UA: 5

## 2013-01-30 NOTE — Progress Notes (Signed)
Pulse: 84

## 2013-02-13 ENCOUNTER — Ambulatory Visit (INDEPENDENT_AMBULATORY_CARE_PROVIDER_SITE_OTHER): Admitting: Obstetrics

## 2013-02-13 VITALS — BP 100/67 | Temp 97.2°F | Wt 222.0 lb

## 2013-02-13 DIAGNOSIS — Z3480 Encounter for supervision of other normal pregnancy, unspecified trimester: Secondary | ICD-10-CM

## 2013-02-13 DIAGNOSIS — Z3483 Encounter for supervision of other normal pregnancy, third trimester: Secondary | ICD-10-CM

## 2013-02-13 LAB — POCT URINALYSIS DIPSTICK
Bilirubin, UA: NEGATIVE
Blood, UA: NEGATIVE
Glucose, UA: NEGATIVE
Ketones, UA: NEGATIVE
Nitrite, UA: NEGATIVE
Spec Grav, UA: 1.02
Urobilinogen, UA: NEGATIVE
pH, UA: 6

## 2013-02-13 NOTE — Progress Notes (Signed)
Pulse-88 Pt reports discharge is "extra watery" x 1 week. Pt c/o lower back pain radiating up spine x 3 days worst at night.

## 2013-02-14 ENCOUNTER — Encounter: Payer: Self-pay | Admitting: Obstetrics

## 2013-02-25 ENCOUNTER — Encounter: Admitting: Obstetrics

## 2013-03-05 ENCOUNTER — Encounter: Payer: Self-pay | Admitting: Obstetrics

## 2013-03-05 ENCOUNTER — Ambulatory Visit (INDEPENDENT_AMBULATORY_CARE_PROVIDER_SITE_OTHER): Admitting: Obstetrics

## 2013-03-05 VITALS — BP 110/74 | Temp 98.5°F | Wt 222.0 lb

## 2013-03-05 DIAGNOSIS — Z3483 Encounter for supervision of other normal pregnancy, third trimester: Secondary | ICD-10-CM

## 2013-03-05 DIAGNOSIS — Z3480 Encounter for supervision of other normal pregnancy, unspecified trimester: Secondary | ICD-10-CM

## 2013-03-05 LAB — POCT URINALYSIS DIPSTICK
Bilirubin, UA: NEGATIVE
Blood, UA: NEGATIVE
Glucose, UA: NEGATIVE
Ketones, UA: NEGATIVE
Nitrite, UA: NEGATIVE
Spec Grav, UA: 1.02
Urobilinogen, UA: NEGATIVE
pH, UA: 6

## 2013-03-05 NOTE — Progress Notes (Signed)
P- 86 Pt states she is having sharp pains in her vaginal area. Pt states she is having vaginal pressure as well. Pt states she is vomiting 1-2 times daily for 1 week.

## 2013-03-05 NOTE — Addendum Note (Signed)
Addended by: George Hugh on: 03/05/2013 03:12 PM   Modules accepted: Orders

## 2013-03-07 LAB — STREP B DNA PROBE: GBSP: NEGATIVE

## 2013-03-12 ENCOUNTER — Ambulatory Visit (INDEPENDENT_AMBULATORY_CARE_PROVIDER_SITE_OTHER): Admitting: Obstetrics

## 2013-03-12 VITALS — BP 114/78 | Temp 98.3°F | Wt 220.6 lb

## 2013-03-12 DIAGNOSIS — Z3483 Encounter for supervision of other normal pregnancy, third trimester: Secondary | ICD-10-CM

## 2013-03-12 DIAGNOSIS — O368190 Decreased fetal movements, unspecified trimester, not applicable or unspecified: Secondary | ICD-10-CM

## 2013-03-12 DIAGNOSIS — Z3480 Encounter for supervision of other normal pregnancy, unspecified trimester: Secondary | ICD-10-CM

## 2013-03-12 LAB — POCT URINALYSIS DIPSTICK
Spec Grav, UA: 1.02
pH, UA: 6

## 2013-03-12 NOTE — Progress Notes (Signed)
Pulse: 83

## 2013-03-13 ENCOUNTER — Ambulatory Visit (INDEPENDENT_AMBULATORY_CARE_PROVIDER_SITE_OTHER): Admitting: Obstetrics

## 2013-03-13 VITALS — BP 121/85 | Temp 98.4°F | Wt 222.0 lb

## 2013-03-13 DIAGNOSIS — Z3483 Encounter for supervision of other normal pregnancy, third trimester: Secondary | ICD-10-CM

## 2013-03-13 DIAGNOSIS — Z3480 Encounter for supervision of other normal pregnancy, unspecified trimester: Secondary | ICD-10-CM

## 2013-03-13 LAB — POCT URINALYSIS DIPSTICK
Bilirubin, UA: NEGATIVE
Blood, UA: NEGATIVE
Glucose, UA: NEGATIVE
Ketones, UA: NEGATIVE
Nitrite, UA: NEGATIVE
Spec Grav, UA: 1.02
Urobilinogen, UA: NEGATIVE
pH, UA: 6

## 2013-03-13 NOTE — Progress Notes (Signed)
Pulse-  87.  Patient was not seen on 03/13/13.

## 2013-03-14 ENCOUNTER — Encounter: Payer: Self-pay | Admitting: Obstetrics

## 2013-03-21 ENCOUNTER — Encounter: Payer: Self-pay | Admitting: Obstetrics

## 2013-03-21 ENCOUNTER — Ambulatory Visit (INDEPENDENT_AMBULATORY_CARE_PROVIDER_SITE_OTHER): Admitting: Obstetrics

## 2013-03-21 VITALS — BP 119/83 | Temp 98.5°F | Wt 223.0 lb

## 2013-03-21 DIAGNOSIS — Z3480 Encounter for supervision of other normal pregnancy, unspecified trimester: Secondary | ICD-10-CM

## 2013-03-21 DIAGNOSIS — Z3483 Encounter for supervision of other normal pregnancy, third trimester: Secondary | ICD-10-CM

## 2013-03-21 LAB — POCT URINALYSIS DIPSTICK
Bilirubin, UA: NEGATIVE
Blood, UA: NEGATIVE
Glucose, UA: NEGATIVE
Ketones, UA: NEGATIVE
Leukocytes, UA: NEGATIVE
Nitrite, UA: NEGATIVE
Protein, UA: NEGATIVE
Spec Grav, UA: 1.015
Urobilinogen, UA: NEGATIVE
pH, UA: 6

## 2013-03-21 NOTE — Progress Notes (Signed)
Pulse: 81

## 2013-03-28 ENCOUNTER — Encounter: Payer: Self-pay | Admitting: Obstetrics

## 2013-03-28 ENCOUNTER — Ambulatory Visit (INDEPENDENT_AMBULATORY_CARE_PROVIDER_SITE_OTHER): Admitting: Obstetrics

## 2013-03-28 VITALS — BP 126/81 | Temp 98.6°F | Wt 224.0 lb

## 2013-03-28 DIAGNOSIS — Z3483 Encounter for supervision of other normal pregnancy, third trimester: Secondary | ICD-10-CM

## 2013-03-28 DIAGNOSIS — Z3480 Encounter for supervision of other normal pregnancy, unspecified trimester: Secondary | ICD-10-CM

## 2013-03-28 LAB — POCT URINALYSIS DIPSTICK
Bilirubin, UA: NEGATIVE
Blood, UA: NEGATIVE
Glucose, UA: NEGATIVE
Ketones, UA: NEGATIVE
Nitrite, UA: NEGATIVE
Protein, UA: NEGATIVE
Spec Grav, UA: 1.015
Urobilinogen, UA: NEGATIVE
pH, UA: 7

## 2013-03-28 NOTE — Progress Notes (Signed)
P 82 Patient reports she has pressure and inconsistent contractions. Some watery discharge.

## 2013-04-01 ENCOUNTER — Inpatient Hospital Stay (HOSPITAL_COMMUNITY)
Admission: AD | Admit: 2013-04-01 | Discharge: 2013-04-01 | Disposition: A | Discharge status: Home / Self Care | Source: Ambulatory Visit | Attending: Obstetrics & Gynecology | Admitting: Obstetrics & Gynecology

## 2013-04-01 ENCOUNTER — Encounter (HOSPITAL_COMMUNITY): Payer: Self-pay | Admitting: *Deleted

## 2013-04-01 DIAGNOSIS — O479 False labor, unspecified: Secondary | ICD-10-CM | POA: Insufficient documentation

## 2013-04-01 DIAGNOSIS — N949 Unspecified condition associated with female genital organs and menstrual cycle: Secondary | ICD-10-CM | POA: Insufficient documentation

## 2013-04-01 DIAGNOSIS — O368390 Maternal care for abnormalities of the fetal heart rate or rhythm, unspecified trimester, not applicable or unspecified: Secondary | ICD-10-CM | POA: Insufficient documentation

## 2013-04-01 HISTORY — DX: Other specified health status: Z78.9

## 2013-04-01 LAB — WET PREP, GENITAL
Clue Cells Wet Prep HPF POC: NONE SEEN
Trich, Wet Prep: NONE SEEN
Yeast Wet Prep HPF POC: NONE SEEN

## 2013-04-01 NOTE — MAU Provider Note (Signed)
HPI: Brenda Navarro is a 27 y.o. female; G2P1001 at [redacted]w[redacted]d who presents with possible ROM. I was called by the RN to do a speculum to assess for ROM.  GENERAL: Well-developed, well-nourished female in no acute distress.  HEENT: Normocephalic, atraumatic.   LUNGS: Effort normal HEART: Regular rate  SKIN: Warm, dry and without erythema PSYCH: Normal mood and affect  Filed Vitals:   04/01/13 1212  BP: 119/72  Pulse: 84  TempSrc: Oral  Resp: 16  Height: 5' 3.5" (1.613 m)  Weight: 102.785 kg (226 lb 9.6 oz)  SpO2: 98%    Fetal Tracing: Baseline: 140 Variability: + Accelerations: 10X10 Decelerations: 1 variable   Toco: Irregular  I reviewed the tracing with the RN and recommended further monitoring; RN presented fetal tracing to Dr. Tamela Oddi who gave a verbal order to discharge patient home.   Speculum exam: Vagina - Small amount of creamy, thick discharge, no odor Cervix - No contact bleeding, thick, mucous like discharge at cervical os. Bimanual exam: Dilation: 2 cm Effacement: 60% Cervical position: posterior  Station: -2 Presentation: Vertex  Exam by: Venia Carbon FNP-C wet prep done Chaperone present for exam.  Fern Slide: Negative  Results for orders placed during the hospital encounter of 04/01/13 (from the past 24 hour(s))  WET PREP, GENITAL     Status: Abnormal   Collection Time    04/01/13 12:35 PM      Result Value Range   Yeast Wet Prep HPF POC NONE SEEN  NONE SEEN   Trich, Wet Prep NONE SEEN  NONE SEEN   Clue Cells Wet Prep HPF POC NONE SEEN  NONE SEEN   WBC, Wet Prep HPF POC MANY (*) NONE SEEN    Iona Hansen Rasch, NP 04/01/2013 1:20 PM

## 2013-04-01 NOTE — MAU Note (Signed)
Patient states she has had an increase in vaginal discharge for the past 2 days. States more and watery today. Denies contractions at this time and reports good fetal movement.

## 2013-04-01 NOTE — Discharge Instructions (Signed)
Fetal Movement Counts Patient Name: __________________________________________________ Patient Due Date: ____________________ Performing a fetal movement count is highly recommended in high-risk pregnancies, but it is good for every pregnant woman to do. Your caregiver may ask you to start counting fetal movements at 28 weeks of the pregnancy. Fetal movements often increase:  After eating a full meal.  After physical activity.  After eating or drinking something sweet or cold.  At rest. Pay attention to when you feel the baby is most active. This will help you notice a pattern of your baby's sleep and wake cycles and what factors contribute to an increase in fetal movement. It is important to perform a fetal movement count at the same time each day when your baby is normally most active.  HOW TO COUNT FETAL MOVEMENTS 1. Find a quiet and comfortable area to sit or lie down on your left side. Lying on your left side provides the best blood and oxygen circulation to your baby. 2. Write down the day and time on a sheet of paper or in a journal. 3. Start counting kicks, flutters, swishes, rolls, or jabs in a 2 hour period. You should feel at least 10 movements within 2 hours. 4. If you do not feel 10 movements in 2 hours, wait 2 3 hours and count again. Look for a change in the pattern or not enough counts in 2 hours. SEEK MEDICAL CARE IF:  You feel less than 10 counts in 2 hours, tried twice.  There is no movement in over an hour.  The pattern is changing or taking longer each day to reach 10 counts in 2 hours.  You feel the baby is not moving as he or she usually does. Date: ____________ Movements: ____________ Start time: ____________ Finish time: ____________  Date: ____________ Movements: ____________ Start time: ____________ Finish time: ____________ Date: ____________ Movements: ____________ Start time: ____________ Finish time: ____________ Date: ____________ Movements: ____________  Start time: ____________ Finish time: ____________ Date: ____________ Movements: ____________ Start time: ____________ Finish time: ____________ Date: ____________ Movements: ____________ Start time: ____________ Finish time: ____________ Date: ____________ Movements: ____________ Start time: ____________ Finish time: ____________ Date: ____________ Movements: ____________ Start time: ____________ Finish time: ____________  Date: ____________ Movements: ____________ Start time: ____________ Finish time: ____________ Date: ____________ Movements: ____________ Start time: ____________ Finish time: ____________ Date: ____________ Movements: ____________ Start time: ____________ Finish time: ____________ Date: ____________ Movements: ____________ Start time: ____________ Finish time: ____________ Date: ____________ Movements: ____________ Start time: ____________ Finish time: ____________ Date: ____________ Movements: ____________ Start time: ____________ Finish time: ____________ Date: ____________ Movements: ____________ Start time: ____________ Finish time: ____________  Date: ____________ Movements: ____________ Start time: ____________ Finish time: ____________ Date: ____________ Movements: ____________ Start time: ____________ Finish time: ____________ Date: ____________ Movements: ____________ Start time: ____________ Finish time: ____________ Date: ____________ Movements: ____________ Start time: ____________ Finish time: ____________ Date: ____________ Movements: ____________ Start time: ____________ Finish time: ____________ Date: ____________ Movements: ____________ Start time: ____________ Finish time: ____________ Date: ____________ Movements: ____________ Start time: ____________ Finish time: ____________  Date: ____________ Movements: ____________ Start time: ____________ Finish time: ____________ Date: ____________ Movements: ____________ Start time: ____________ Finish time:  ____________ Date: ____________ Movements: ____________ Start time: ____________ Finish time: ____________ Date: ____________ Movements: ____________ Start time: ____________ Finish time: ____________ Date: ____________ Movements: ____________ Start time: ____________ Finish time: ____________ Date: ____________ Movements: ____________ Start time: ____________ Finish time: ____________ Date: ____________ Movements: ____________ Start time: ____________ Finish time: ____________  Date: ____________ Movements: ____________ Start time: ____________ Finish   time: ____________ Date: ____________ Movements: ____________ Start time: ____________ Finish time: ____________ Date: ____________ Movements: ____________ Start time: ____________ Finish time: ____________ Date: ____________ Movements: ____________ Start time: ____________ Finish time: ____________ Date: ____________ Movements: ____________ Start time: ____________ Finish time: ____________ Date: ____________ Movements: ____________ Start time: ____________ Finish time: ____________ Date: ____________ Movements: ____________ Start time: ____________ Finish time: ____________  Date: ____________ Movements: ____________ Start time: ____________ Finish time: ____________ Date: ____________ Movements: ____________ Start time: ____________ Finish time: ____________ Date: ____________ Movements: ____________ Start time: ____________ Finish time: ____________ Date: ____________ Movements: ____________ Start time: ____________ Finish time: ____________ Date: ____________ Movements: ____________ Start time: ____________ Finish time: ____________ Date: ____________ Movements: ____________ Start time: ____________ Finish time: ____________ Date: ____________ Movements: ____________ Start time: ____________ Finish time: ____________  Date: ____________ Movements: ____________ Start time: ____________ Finish time: ____________ Date: ____________ Movements:  ____________ Start time: ____________ Finish time: ____________ Date: ____________ Movements: ____________ Start time: ____________ Finish time: ____________ Date: ____________ Movements: ____________ Start time: ____________ Finish time: ____________ Date: ____________ Movements: ____________ Start time: ____________ Finish time: ____________ Date: ____________ Movements: ____________ Start time: ____________ Finish time: ____________ Date: ____________ Movements: ____________ Start time: ____________ Finish time: ____________  Date: ____________ Movements: ____________ Start time: ____________ Finish time: ____________ Date: ____________ Movements: ____________ Start time: ____________ Finish time: ____________ Date: ____________ Movements: ____________ Start time: ____________ Finish time: ____________ Date: ____________ Movements: ____________ Start time: ____________ Finish time: ____________ Date: ____________ Movements: ____________ Start time: ____________ Finish time: ____________ Date: ____________ Movements: ____________ Start time: ____________ Finish time: ____________ Document Released: 07/27/2006 Document Revised: 06/13/2012 Document Reviewed: 04/23/2012 [employer] Patient Information 2014 [employer].  

## 2013-04-02 ENCOUNTER — Encounter (HOSPITAL_COMMUNITY): Payer: Self-pay

## 2013-04-02 ENCOUNTER — Inpatient Hospital Stay (HOSPITAL_COMMUNITY)
Admission: AD | Admit: 2013-04-02 | Discharge: 2013-04-04 | DRG: 775 | Disposition: A | Discharge status: Home / Self Care | Source: Ambulatory Visit | Attending: Obstetrics & Gynecology | Admitting: Obstetrics & Gynecology

## 2013-04-02 DIAGNOSIS — IMO0001 Reserved for inherently not codable concepts without codable children: Secondary | ICD-10-CM

## 2013-04-02 HISTORY — DX: Unspecified abnormal cytological findings in specimens from cervix uteri: R87.619

## 2013-04-02 HISTORY — DX: Reserved for concepts with insufficient information to code with codable children: IMO0002

## 2013-04-02 LAB — ABO/RH: ABO/RH(D): O POS

## 2013-04-02 LAB — TYPE AND SCREEN
ABO/RH(D): O POS
Antibody Screen: NEGATIVE

## 2013-04-02 LAB — CBC
HCT: 36.9 % (ref 36.0–46.0)
Hemoglobin: 12.1 g/dL (ref 12.0–15.0)
MCH: 27.2 pg (ref 26.0–34.0)
MCHC: 32.8 g/dL (ref 30.0–36.0)
MCV: 82.9 fL (ref 78.0–100.0)
Platelets: 256 10*3/uL (ref 150–400)
RBC: 4.45 MIL/uL (ref 3.87–5.11)
RDW: 14.3 % (ref 11.5–15.5)
WBC: 10.2 10*3/uL (ref 4.0–10.5)

## 2013-04-02 LAB — RPR: RPR Ser Ql: NONREACTIVE

## 2013-04-02 MED ORDER — ZOLPIDEM TARTRATE 5 MG PO TABS: 5.0000 mg | ORAL_TABLET | Freq: Every evening | ORAL | Status: DC | PRN

## 2013-04-02 MED ORDER — LACTATED RINGERS IV SOLN
INTRAVENOUS | Status: DC
  Administered 2013-04-02: 125 mL/h via INTRAVENOUS

## 2013-04-02 MED ORDER — FENTANYL CITRATE 0.05 MG/ML IJ SOLN
INTRAMUSCULAR | Status: AC
  Administered 2013-04-02: 100 ug via INTRAVENOUS
  Filled 2013-04-02: qty 2

## 2013-04-02 MED ORDER — LACTATED RINGERS IV SOLN: 500.0000 mL | INTRAVENOUS | Status: DC | PRN

## 2013-04-02 MED ORDER — CITRIC ACID-SODIUM CITRATE 334-500 MG/5ML PO SOLN: 30.0000 mL | ORAL | Status: DC | PRN

## 2013-04-02 MED ORDER — TETANUS-DIPHTH-ACELL PERTUSSIS 5-2.5-18.5 LF-MCG/0.5 IM SUSP: 0.5000 mL | Freq: Once | INTRAMUSCULAR | Status: DC

## 2013-04-02 MED ORDER — PHENYLEPHRINE 40 MCG/ML (10ML) SYRINGE FOR IV PUSH (FOR BLOOD PRESSURE SUPPORT): 80.0000 ug | PREFILLED_SYRINGE | INTRAVENOUS | Status: DC | PRN

## 2013-04-02 MED ORDER — LIDOCAINE HCL (PF) 1 % IJ SOLN: 30.0000 mL | INTRAMUSCULAR | Status: DC | PRN

## 2013-04-02 MED ORDER — IBUPROFEN 600 MG PO TABS
600.0000 mg | ORAL_TABLET | Freq: Four times a day (QID) | ORAL | Status: DC
  Administered 2013-04-02 – 2013-04-04 (×8): 600 mg via ORAL
  Filled 2013-04-02 (×8): qty 1

## 2013-04-02 MED ORDER — LANOLIN HYDROUS EX OINT: TOPICAL_OINTMENT | CUTANEOUS | Status: DC | PRN

## 2013-04-02 MED ORDER — ONDANSETRON HCL 4 MG/2ML IJ SOLN: 4.0000 mg | INTRAMUSCULAR | Status: DC | PRN

## 2013-04-02 MED ORDER — FENTANYL CITRATE 0.05 MG/ML IJ SOLN
100.0000 ug | INTRAMUSCULAR | Status: DC | PRN
  Administered 2013-04-02 (×2): 100 ug via INTRAVENOUS
  Filled 2013-04-02: qty 2

## 2013-04-02 MED ORDER — LACTATED RINGERS IV SOLN: 500.0000 mL | Freq: Once | INTRAVENOUS | Status: DC

## 2013-04-02 MED ORDER — DIPHENHYDRAMINE HCL 50 MG/ML IJ SOLN: 12.5000 mg | INTRAMUSCULAR | Status: DC | PRN

## 2013-04-02 MED ORDER — MEDROXYPROGESTERONE ACETATE 150 MG/ML IM SUSP: 150.0000 mg | INTRAMUSCULAR | Status: DC | PRN

## 2013-04-02 MED ORDER — WITCH HAZEL-GLYCERIN EX PADS: 1.0000 "application " | MEDICATED_PAD | CUTANEOUS | Status: DC | PRN

## 2013-04-02 MED ORDER — ONDANSETRON HCL 4 MG PO TABS: 4.0000 mg | ORAL_TABLET | ORAL | Status: DC | PRN

## 2013-04-02 MED ORDER — DIPHENHYDRAMINE HCL 25 MG PO CAPS: 25.0000 mg | ORAL_CAPSULE | Freq: Four times a day (QID) | ORAL | Status: DC | PRN

## 2013-04-02 MED ORDER — OXYTOCIN BOLUS FROM INFUSION: 500.0000 mL | INTRAVENOUS | Status: DC

## 2013-04-02 MED ORDER — ACETAMINOPHEN 325 MG PO TABS: 650.0000 mg | ORAL_TABLET | ORAL | Status: DC | PRN

## 2013-04-02 MED ORDER — SENNOSIDES-DOCUSATE SODIUM 8.6-50 MG PO TABS
2.0000 | ORAL_TABLET | ORAL | Status: DC
  Administered 2013-04-03 (×2): 2 via ORAL

## 2013-04-02 MED ORDER — ONDANSETRON HCL 4 MG/2ML IJ SOLN: 4.0000 mg | Freq: Four times a day (QID) | INTRAMUSCULAR | Status: DC | PRN

## 2013-04-02 MED ORDER — FENTANYL 2.5 MCG/ML BUPIVACAINE 1/10 % EPIDURAL INFUSION (WH - ANES): 14.0000 mL/h | INTRAMUSCULAR | Status: DC | PRN

## 2013-04-02 MED ORDER — BENZOCAINE-MENTHOL 20-0.5 % EX AERO
1.0000 "application " | INHALATION_SPRAY | CUTANEOUS | Status: DC | PRN
  Administered 2013-04-02: 1 via TOPICAL
  Filled 2013-04-02: qty 56

## 2013-04-02 MED ORDER — DIBUCAINE 1 % RE OINT: 1.0000 "application " | TOPICAL_OINTMENT | RECTAL | Status: DC | PRN

## 2013-04-02 MED ORDER — OXYCODONE-ACETAMINOPHEN 5-325 MG PO TABS: 1.0000 | ORAL_TABLET | ORAL | Status: DC | PRN

## 2013-04-02 MED ORDER — SIMETHICONE 80 MG PO CHEW: 80.0000 mg | CHEWABLE_TABLET | ORAL | Status: DC | PRN

## 2013-04-02 MED ORDER — IBUPROFEN 600 MG PO TABS
600.0000 mg | ORAL_TABLET | Freq: Four times a day (QID) | ORAL | Status: DC | PRN
  Administered 2013-04-02: 600 mg via ORAL
  Filled 2013-04-02: qty 1

## 2013-04-02 MED ORDER — OXYCODONE-ACETAMINOPHEN 5-325 MG PO TABS
1.0000 | ORAL_TABLET | ORAL | Status: DC | PRN
  Administered 2013-04-03 (×2): 1 via ORAL
  Filled 2013-04-02 (×2): qty 1

## 2013-04-02 MED ORDER — PRENATAL MULTIVITAMIN CH
1.0000 | ORAL_TABLET | Freq: Every day | ORAL | Status: DC
  Administered 2013-04-02 – 2013-04-04 (×3): 1 via ORAL
  Filled 2013-04-02 (×3): qty 1

## 2013-04-02 MED ORDER — EPHEDRINE 5 MG/ML INJ: 10.0000 mg | INTRAVENOUS | Status: DC | PRN

## 2013-04-02 MED ORDER — OXYTOCIN 40 UNITS IN LACTATED RINGERS INFUSION - SIMPLE MED
62.5000 mL/h | INTRAVENOUS | Status: DC
  Administered 2013-04-02: 999 mL/h via INTRAVENOUS
  Filled 2013-04-02: qty 1000

## 2013-04-02 MED ORDER — FLEET ENEMA 7-19 GM/118ML RE ENEM: 1.0000 | ENEMA | RECTAL | Status: DC | PRN

## 2013-04-02 NOTE — MAU Note (Signed)
Audible fetal movement heard.

## 2013-04-02 NOTE — MAU Note (Signed)
Pt presents with contractions, denies bleeding or ROM 

## 2013-04-02 NOTE — Progress Notes (Addendum)
Brenda Navarro is a 27 y.o. G2P1001 at [redacted]w[redacted]d by ultrasound admitted for active labor  Subjective:  Patient desires NCB. Coping well, no concerns or questions. Expecting a boy Brenda Navarro.   Objective: BP 132/65  Pulse 73  Temp(Src) 97.7 F (36.5 C) (Oral)  Resp 20  Ht 5' 3.5" (1.613 m)  Wt 225 lb (102.059 kg)  BMI 39.23 kg/m2  SpO2 99%      FHT:  FHR: 140 bpm, variability: moderate,  accelerations:  Present,  decelerations:  Absent UC:   regular, every 2-5 minutes SVE:   Dilation: 6.5 Effacement (%): 90 Station: -1 Exam by:: Currie Paris, RN  Labs: Lab Results  Component Value Date   WBC 10.2 04/02/2013   HGB 12.1 04/02/2013   HCT 36.9 04/02/2013   MCV 82.9 04/02/2013   PLT 256 04/02/2013    Assessment / Plan: Spontaneous labor, progressing normally  Labor: Progressing normally Preeclampsia:  no signs or symptoms Fetal Wellbeing:  Category I Pain Control:  Labor support without medications I/D:  n/a Anticipated MOD:  NSVD  Brenda Navarro 04/02/2013, 10:20 AM

## 2013-04-02 NOTE — MAU Note (Signed)
Per Dr Tamela Oddi, recheck pt in 2 hours.

## 2013-04-02 NOTE — Progress Notes (Signed)
Brenda Navarro is a 27 y.o. G2P1001 at [redacted]w[redacted]d by ultrasound admitted for active labor  Subjective: Patient requesting IV pain medication for pain relief.  Patient desires augmentation of labor but would like to avoid pitocin.   Objective: BP 132/65  Pulse 73  Temp(Src) 97.7 F (36.5 C) (Oral)  Resp 20  Ht 5' 3.5" (1.613 m)  Wt 225 lb (102.059 kg)  BMI 39.23 kg/m2  SpO2 99%      FHT:  FHR: 140 bpm, variability: absent,  accelerations:  Present,  decelerations:  Absent UC:   , regular, every 2-3  minutes SVE:   Dilation: 6.5 Effacement (%): 90 Station: -1 Exam by:: Currie Paris, RN  Labs: Lab Results  Component Value Date   WBC 10.2 04/02/2013   HGB 12.1 04/02/2013   HCT 36.9 04/02/2013   MCV 82.9 04/02/2013   PLT 256 04/02/2013    Assessment / Plan: Spontaneous labor, progressing normally  Labor: Progressing normally Preeclampsia:  no signs Fetal Wellbeing:  Category I Pain Control:  Fentanyl I/D:  n/a Anticipated MOD:  NSVD  Will reevaluate in 2 hours to assess if AROM may help progression if cervix unchanged.   Jyaire Koudelka 04/02/2013, 10:24 AM

## 2013-04-02 NOTE — H&P (Signed)
Brenda Navarro is Navarro 27 y.o. female presenting for contractions. Maternal Medical History:  Reason for admission: Contractions.   Contractions: Onset was 13-24 hours ago.   Perceived severity is moderate.    Fetal activity: Perceived fetal activity is normal.    Prenatal complications: no prenatal complications Prenatal Complications - Diabetes: none.    OB History   Grav Para Term Preterm Abortions TAB SAB Ect Mult Living   2 1 1       1      Past Medical History  Diagnosis Date  . Medical history non-contributory   . Abnormal Pap smear    Past Surgical History  Procedure Laterality Date  . No past surgeries    . Colposcopy  2011   Family History: family history is not on file. Social History:  reports that she has been smoking Cigarettes.  She has Navarro 5 pack-year smoking history. She does not have any smokeless tobacco history on file. She reports that she does not drink alcohol or use illicit drugs.     Review of Systems  Constitutional: Negative for fever.  Eyes: Negative for blurred vision.  Respiratory: Negative for shortness of breath.   Gastrointestinal: Negative for vomiting.  Skin: Negative for rash.  Neurological: Negative for headaches.    Dilation: 5.5 Effacement (%): 90 Station: -1 Exam by:: L. Lima, RN Blood pressure 146/87, pulse 89, temperature 97.7 F (36.5 C), temperature source Oral, resp. rate 20, height 5' 3.5" (1.613 m), weight 102.059 kg (225 lb), SpO2 99.00%. Maternal Exam:  Uterine Assessment: Contraction strength is moderate.  Contraction frequency is irregular.   Abdomen: Fetal presentation: vertex  Introitus: not evaluated.   Pelvis: adequate for delivery.   Cervix: Cervix evaluated by digital exam.     Fetal Exam Fetal Monitor Review: Variability: moderate (6-25 bpm).   Pattern: accelerations present and no decelerations.    Fetal State Assessment: Category I - tracings are normal.     Physical Exam  Constitutional: She  appears well-developed.  HENT:  Head: Normocephalic.  Neck: Neck supple. No thyromegaly present.  Cardiovascular: Normal rate and regular rhythm.   Respiratory: Breath sounds normal.  GI: Soft. Bowel sounds are normal.  Skin: No rash noted.    Prenatal labs: ABO, Rh: O/POS/-- (04/11 1017) Antibody: NEG (04/11 1017) Rubella: 3.35 (04/11 1017) RPR: NON REAC (06/19 1004)  HBsAg: NEGATIVE (04/11 1017)  HIV: NON REACTIVE (06/19 1004)  GBS: NEGATIVE (08/26 1517)   Assessment/Plan: Primipara at term, active labor, Category 1 FHT. Admit, anticipate an NSVD   Brenda Navarro 04/02/2013, 9:46 AM

## 2013-04-03 LAB — CBC
HCT: 31.7 % — ABNORMAL LOW (ref 36.0–46.0)
Hemoglobin: 10.6 g/dL — ABNORMAL LOW (ref 12.0–15.0)
MCH: 27.6 pg (ref 26.0–34.0)
MCHC: 33.4 g/dL (ref 30.0–36.0)
MCV: 82.6 fL (ref 78.0–100.0)
Platelets: 234 10*3/uL (ref 150–400)
RBC: 3.84 MIL/uL — ABNORMAL LOW (ref 3.87–5.11)
RDW: 14.2 % (ref 11.5–15.5)
WBC: 9.8 10*3/uL (ref 4.0–10.5)

## 2013-04-03 LAB — CCBB MATERNAL DONOR DRAW

## 2013-04-03 MED ORDER — INFLUENZA VAC SPLIT QUAD 0.5 ML IM SUSP
0.5000 mL | INTRAMUSCULAR | Status: AC
  Administered 2013-04-04: 0.5 mL via INTRAMUSCULAR
  Filled 2013-04-03: qty 0.5

## 2013-04-03 NOTE — Progress Notes (Signed)
Post Partum Day 1 Subjective: no complaints  Objective: Blood pressure 125/81, pulse 71, temperature 97.9 F (36.6 C), temperature source Oral, resp. rate 18, height 5' 3.5" (1.613 m), weight 225 lb (102.059 kg), SpO2 99.00%, unknown if currently breastfeeding.  Physical Exam:  General: alert and no distress Lochia: appropriate Uterine Fundus: firm Incision: healing well DVT Evaluation: No evidence of DVT seen on physical exam.   Recent Labs  04/02/13 0640 04/03/13 0610  HGB 12.1 10.6*  HCT 36.9 31.7*    Assessment/Plan: Plan for discharge tomorrow   LOS: 1 day   Brenda Navarro A 04/03/2013, 7:30 AM

## 2013-04-04 MED ORDER — OXYCODONE-ACETAMINOPHEN 5-325 MG PO TABS: 1.0000 | ORAL_TABLET | ORAL | Status: DC | PRN

## 2013-04-04 MED ORDER — IBUPROFEN 600 MG PO TABS: 600.0000 mg | ORAL_TABLET | Freq: Four times a day (QID) | ORAL | Status: DC | PRN

## 2013-04-04 NOTE — Lactation Note (Signed)
This note was copied from the chart of Brenda Marjani Kobel. Lactation Consultation Note   Brief follow up consult with this experienced breast feeding mom. She reports brest feeding going well, and denies any questions/concerns. Mom knows she can call lactation for questions/concerns prn.  Patient Name: Brenda Navarro [id number] Date: 04/04/2013 Reason for consult: Follow-up assessment   Maternal Data    Feeding    LATCH Score/Interventions                      Lactation Tools Discussed/Used     Consult Status Consult Status: Complete Follow-up type: Call as needed    Alfred Levins 04/04/2013, 12:29 PM

## 2013-04-04 NOTE — Progress Notes (Signed)
Post Partum Day 2 Subjective: no complaints  Objective: Blood pressure 106/73, pulse 67, temperature 97.8 F (36.6 C), temperature source Oral, resp. rate 16, height 5' 3.5" (1.613 m), weight 225 lb (102.059 kg), SpO2 99.00%, unknown if currently breastfeeding.  Physical Exam:  General: alert and no distress Lochia: appropriate Uterine Fundus: firm Incision: healing well DVT Evaluation: No evidence of DVT seen on physical exam.   Recent Labs  04/02/13 0640 04/03/13 0610  HGB 12.1 10.6*  HCT 36.9 31.7*    Assessment/Plan: Discharge home   LOS: 2 days   HARPER,CHARLES A 04/04/2013, 7:54 AM

## 2013-04-04 NOTE — Discharge Summary (Signed)
Obstetric Discharge Summary Reason for Admission: onset of labor Prenatal Procedures: ultrasound Intrapartum Procedures: spontaneous vaginal delivery Postpartum Procedures: none Complications-Operative and Postpartum: none Hemoglobin  Date Value Range Status  04/03/2013 10.6* 12.0 - 15.0 g/dL Final     HCT  Date Value Range Status  04/03/2013 31.7* 36.0 - 46.0 % Final    Physical Exam:  General: alert and no distress Lochia: appropriate Uterine Fundus: firm Incision: healing well DVT Evaluation: No evidence of DVT seen on physical exam.  Discharge Diagnoses: Term Pregnancy-delivered  Discharge Information: Date: 04/04/2013 Activity: pelvic rest Diet: routine Medications: PNV, Ibuprofen, Colace and Percocet Condition: stable Instructions: refer to practice specific booklet Discharge to: home Follow-up Information   Follow up with HARPER,CHARLES A, MD. Schedule an appointment as soon as possible for a visit in 2 weeks.   Specialty:  Obstetrics and Gynecology   Contact information:   [address] [place] [address] [phone]       Newborn Data: Live born female  Birth Weight: 7 lb 6.9 oz (3370 g) APGAR: 9, 9  Home with mother.  HARPER,CHARLES A 04/04/2013, 8:01 AM

## 2013-04-04 NOTE — Discharge Instructions (Signed)
Before Baby Comes Home °Ask any questions about feeding, diapering, and baby care before you leave the hospital. Ask again if you do not understand. Ask when you need to see the doctor again. °There are several things you must have before your baby comes home. °· Infant car seat. °· Crib. °· Do not let your baby sleep in a bed with you or anyone else. °· If you do not have a bed for your baby, ask the doctor what you can use that will be safe for the baby to sleep in. °Infant feeding supplies: °· 6 to 8 bottles (8 oz. size). °· 6 to 8 nipples. °· Measuring cup. °· Measuring tablespoon. °· Bottle brush. °· Sterilizer (or use any large pan or kettle with a lid). °· Formula that contains iron. °· A way to boil and cool water. °Breastfeeding supplies: °· Breast pump. °· Nipple cream. °Clothing: °· 24 to 36 cloth diapers and waterproof diaper covers or a box of disposable diapers. You may need as many as 10 to 12 diapers per day. °· 3 onesies (other clothing will depend on the time of year and the weather). °· 3 receiving blankets. °· 3 baby pajamas or gowns. °· 3 bibs. °Bath equipment: °· Mild soap. °· Petroleum jelly. No baby oil or powder. °· Soft cloth towel and wash cloth. °· Cotton balls. °· Separate bath basin for baby. Only sponge bathe until umbilical cord and circumcision are healed. °Other supplies: °· Thermometer and bulb syringe (ask the hospital to send them home with you). Ask your doctor about how you should take your baby's temperature. °· One to two pacifiers. °Prepare for an emergency: °· Know how to get to the hospital and know where to admit your baby. °· Put all doctor numbers near your house phone and in your cell phone if you have one. °Prepare your family: °· Talk with siblings about the baby coming home and how they feel about it. °· Decide how you want to handle visitors and other family members. °· Take offers for help with the baby. You will need time to adjust. °Know when to call the doctor.   °GET HELP RIGHT AWAY IF: °· Your baby's temperature is greater than 100.4° F (38° C). °· The softspot on your baby's head starts to bulge. °· Your baby is crying with no tears or has no wet diapers for 6 hours. °· Your baby has rapid breathing. °· Your baby is not as alert. °Document Released: 06/09/2008 Document Revised: 09/19/2011 Document Reviewed: 09/16/2010 °[employer]® Patient Information ©2014 [employer]. ° °

## 2013-04-17 IMAGING — US US OB LIMITED
1 series · 14 of 27 positions shown · non-contrast
Comparison: none

CLINICAL DATA: Cramping abdominal pain after fall.

LIMITED OBSTETRIC ULTRASOUND
Number of Fetuses: 1
Heart Rate: 152 bpm
Movement: Yes
Presentation: Cephalic
Placental Location: Anterior
Previa: No
Amniotic Fluid (Subjective): Normal
Vertical Pocket 4.9 cm
BPD:  3.94 cm     18 w  0 d         EDC:  03/27/2013

[Series 1: us ob limited · 0.30mm/px · 14 of 27 slices shown]
[im 1/27]
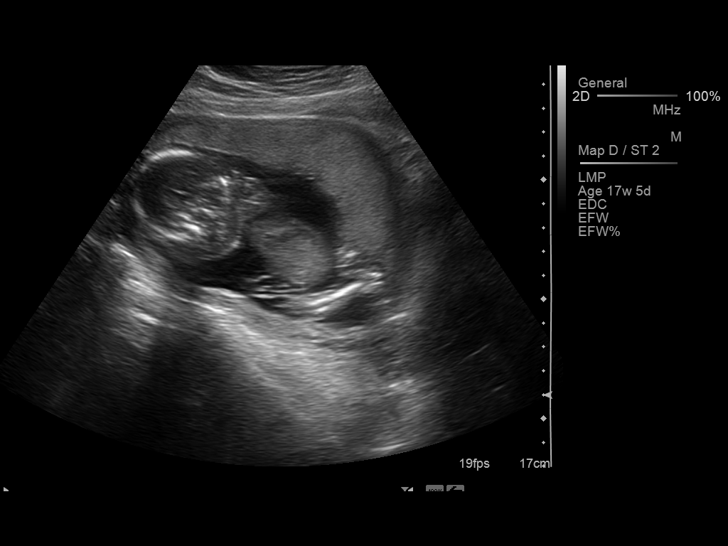
[im 3/27]
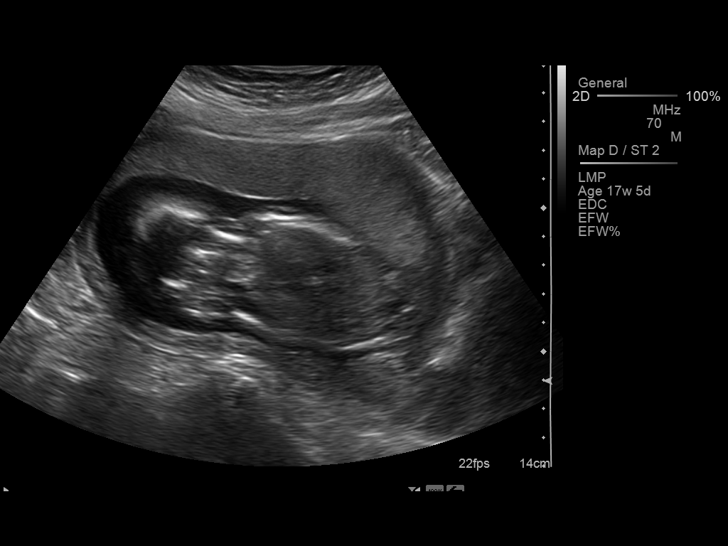
[im 5/27]
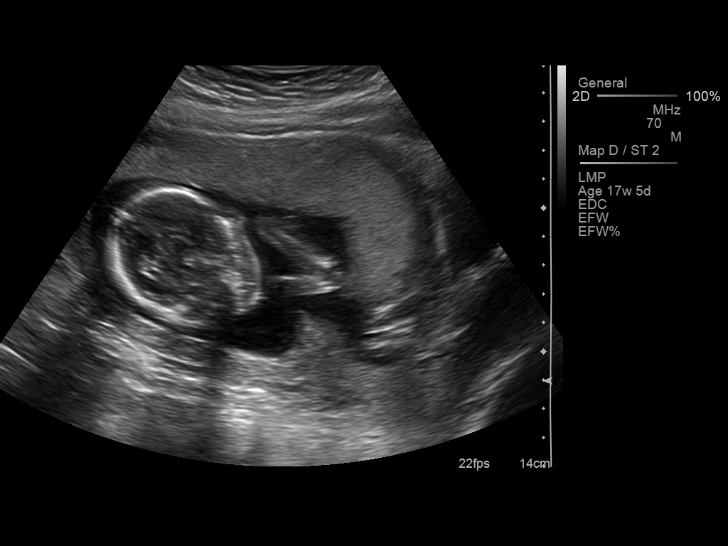
[im 7/27]
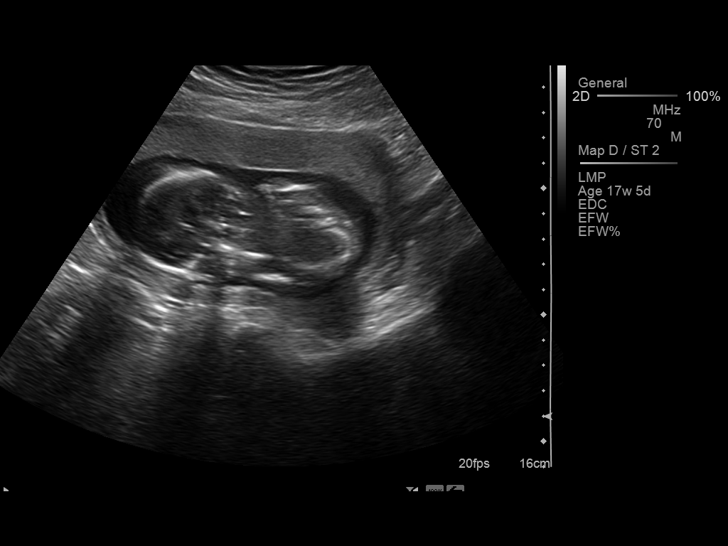
[im 9/27]
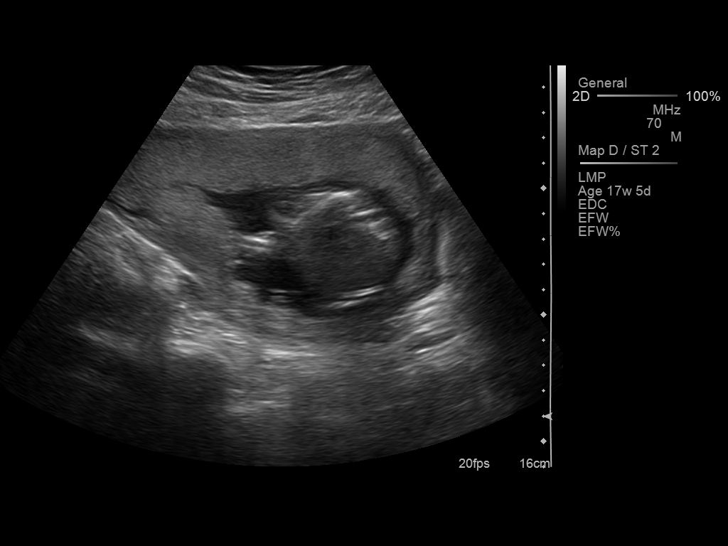
[im 11/27]
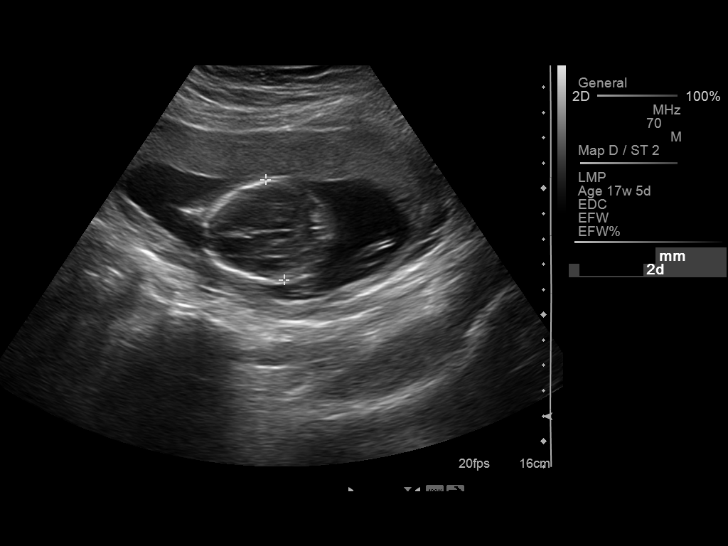
[im 13/27]
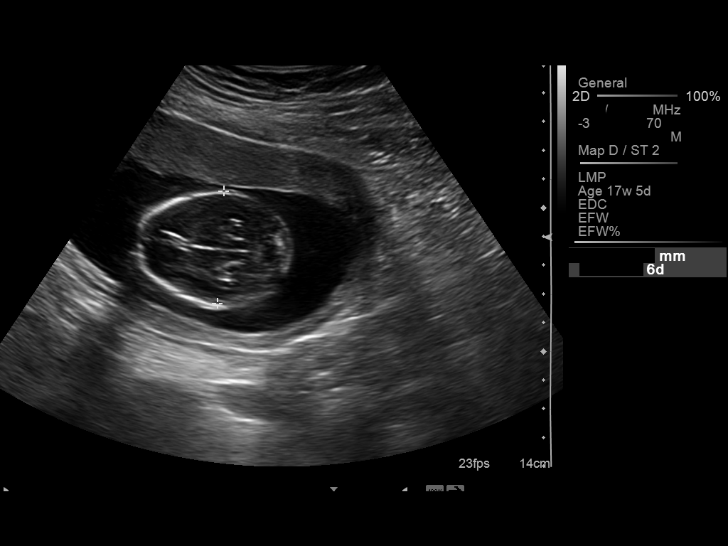
[im 15/27]
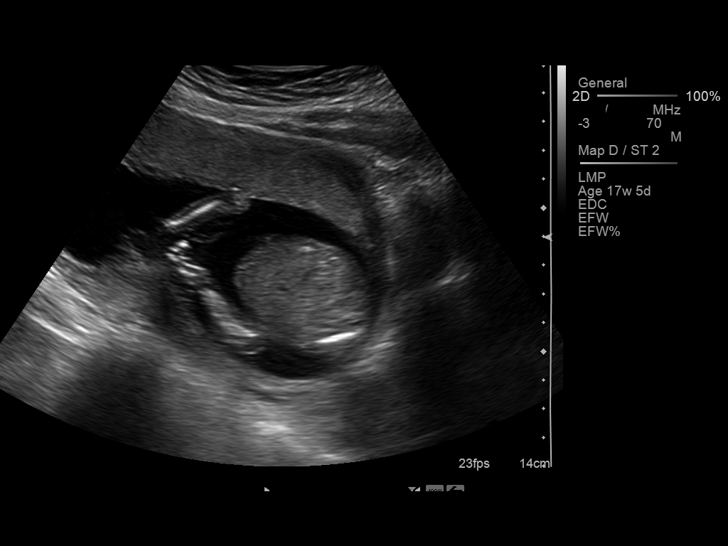
[im 17/27]
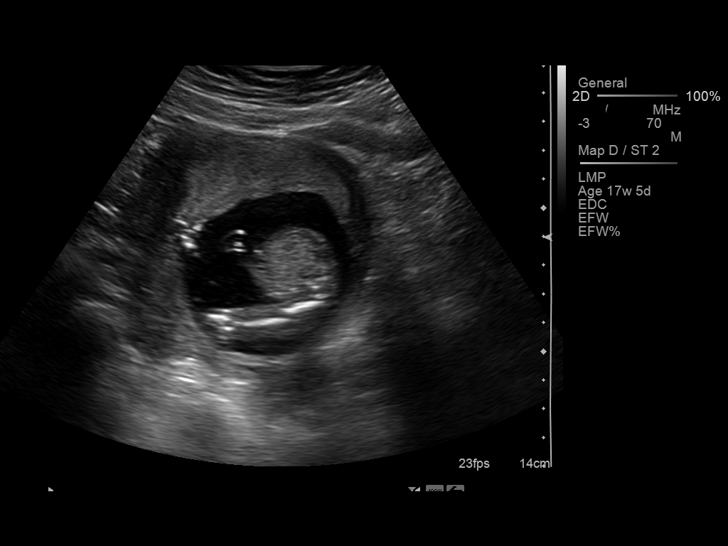
[im 19/27]
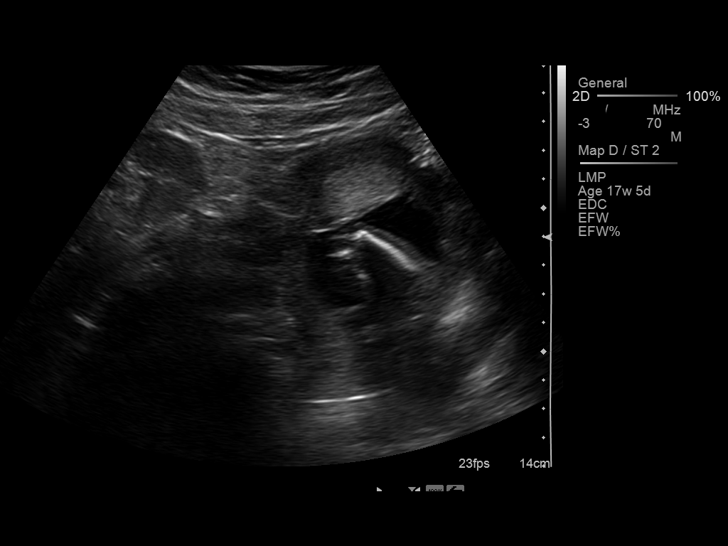
[im 21/27]
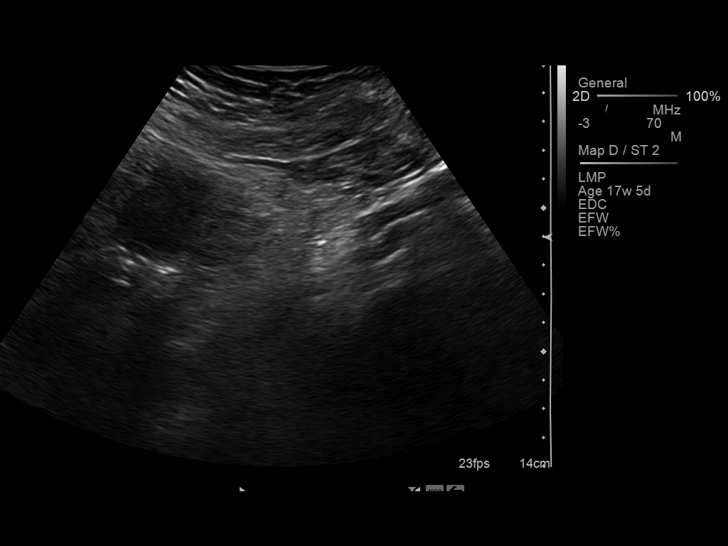
[im 23/27]
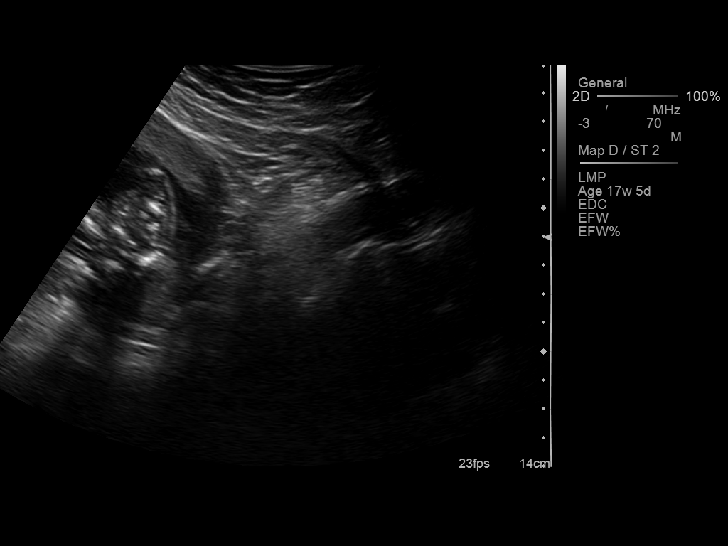
[im 25/27]
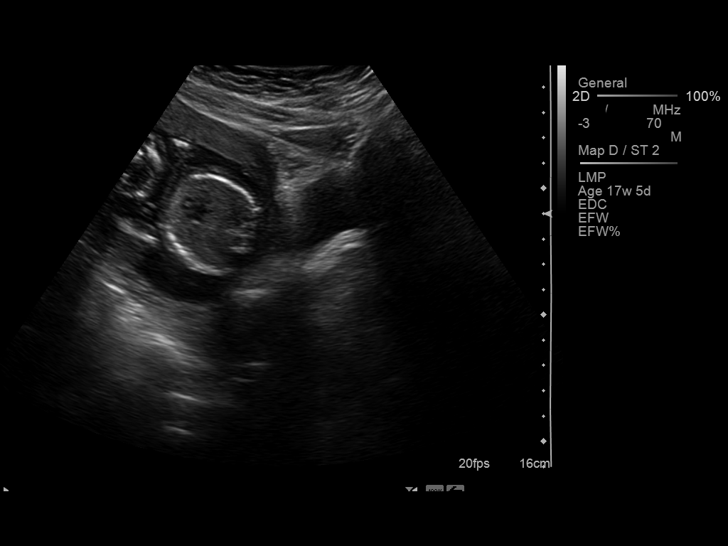
[im 27/27]
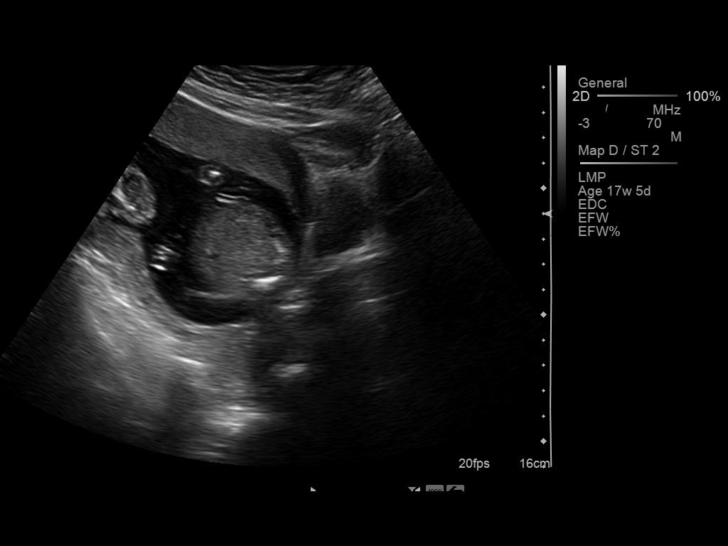

[14 of 27 positions shown; findings below may reference images not displayed]

IMPRESSION: Intrauterine pregnancy, 18 weeks and 0 days gestation.  No
complications are visible.

Recommend followup with non-emergent complete OB 14+ wk US
examination for fetal biometric evaluation and anatomic survey if
not already performed.

## 2013-04-17 IMAGING — CR DG FOOT COMPLETE 3+V*R*
3 series · 3 of 3 positions shown · non-contrast
Comparison: None.

CLINICAL DATA: Pain and swelling of the lateral aspect of the right
foot after the patient fell down stairs today.

RIGHT FOOT COMPLETE - 3+ VIEW

[t foot ap right]
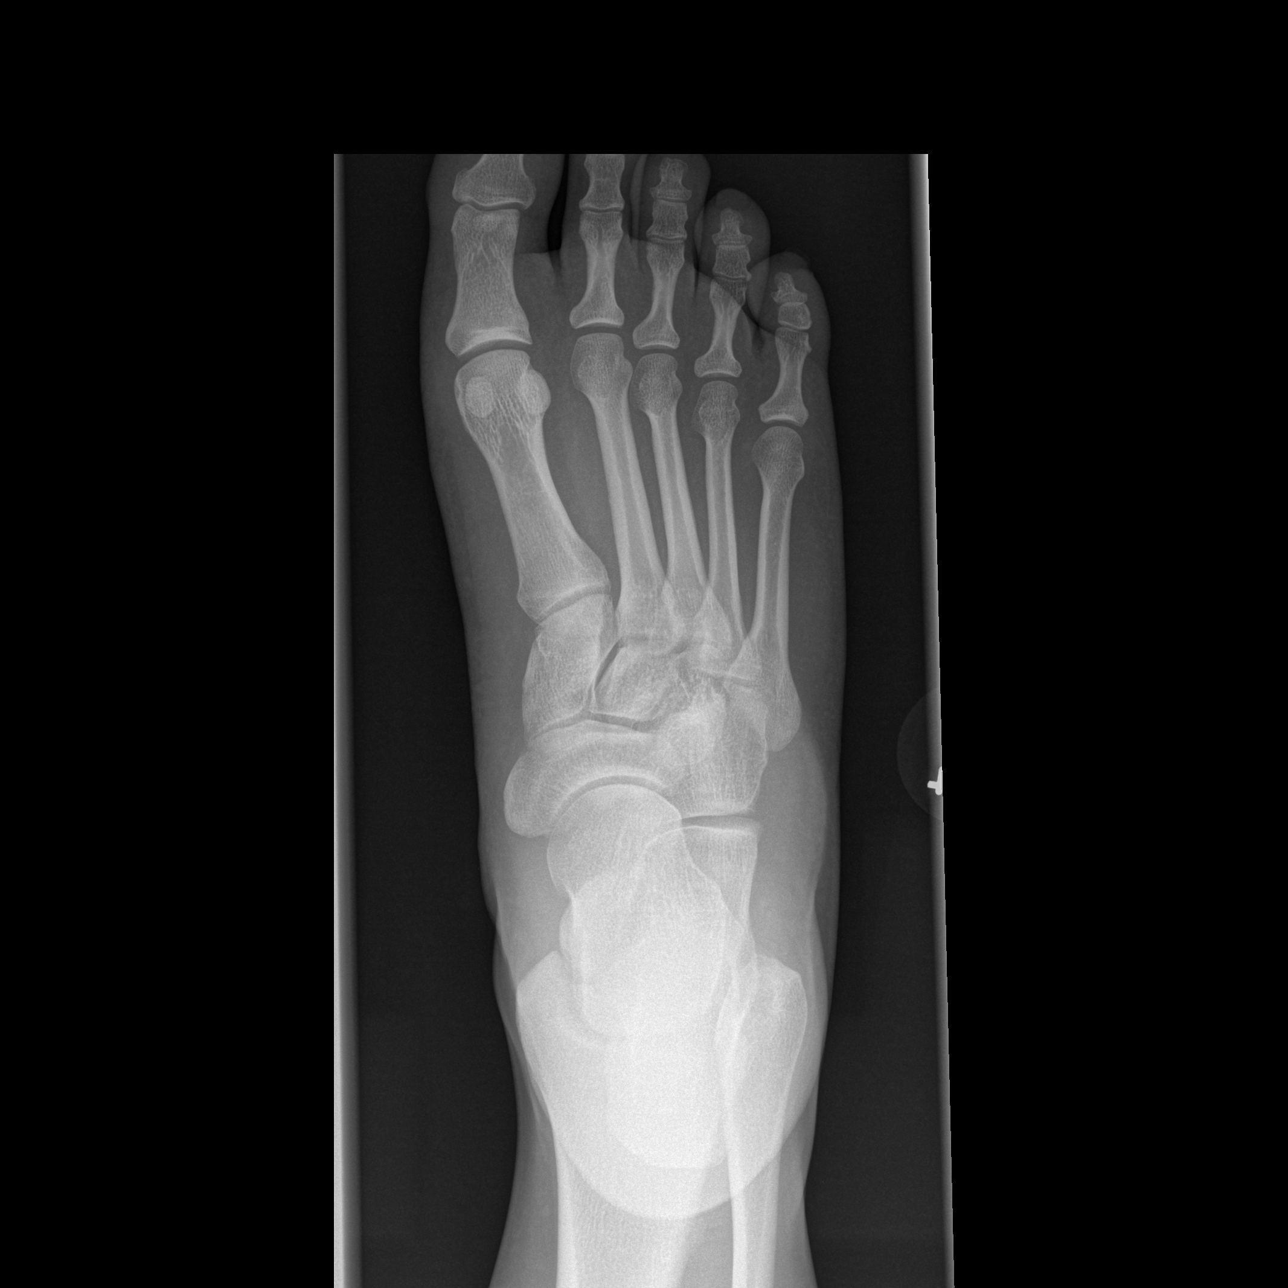

[t foot oblique right]
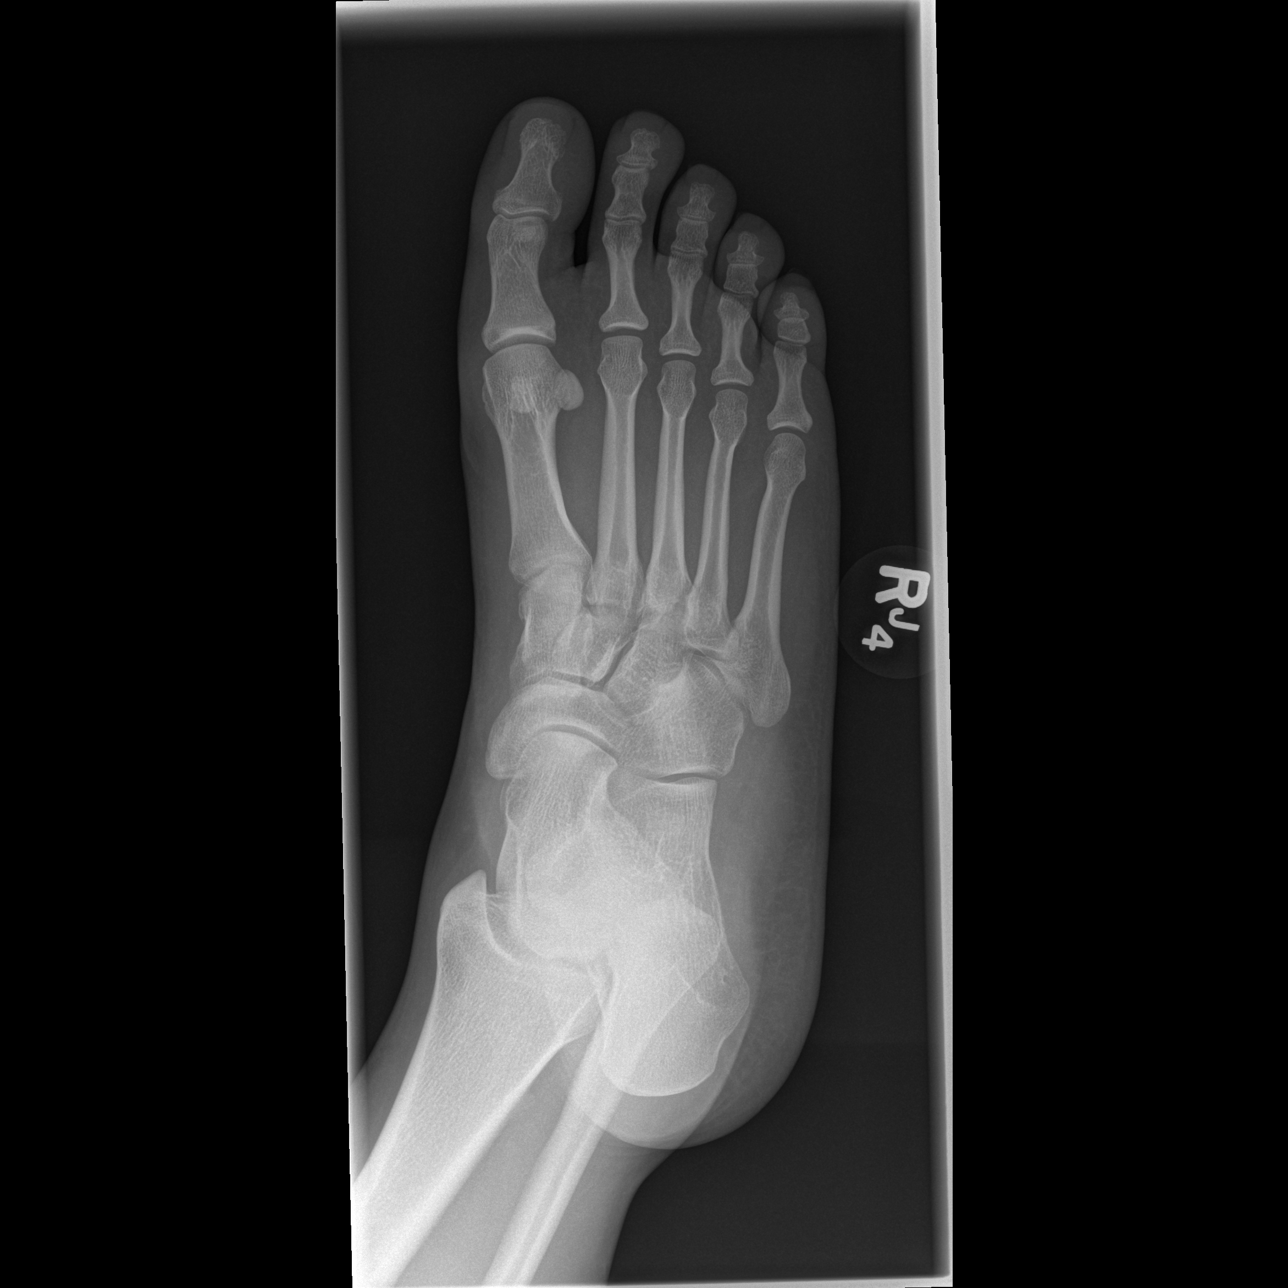

[t foot lat right]
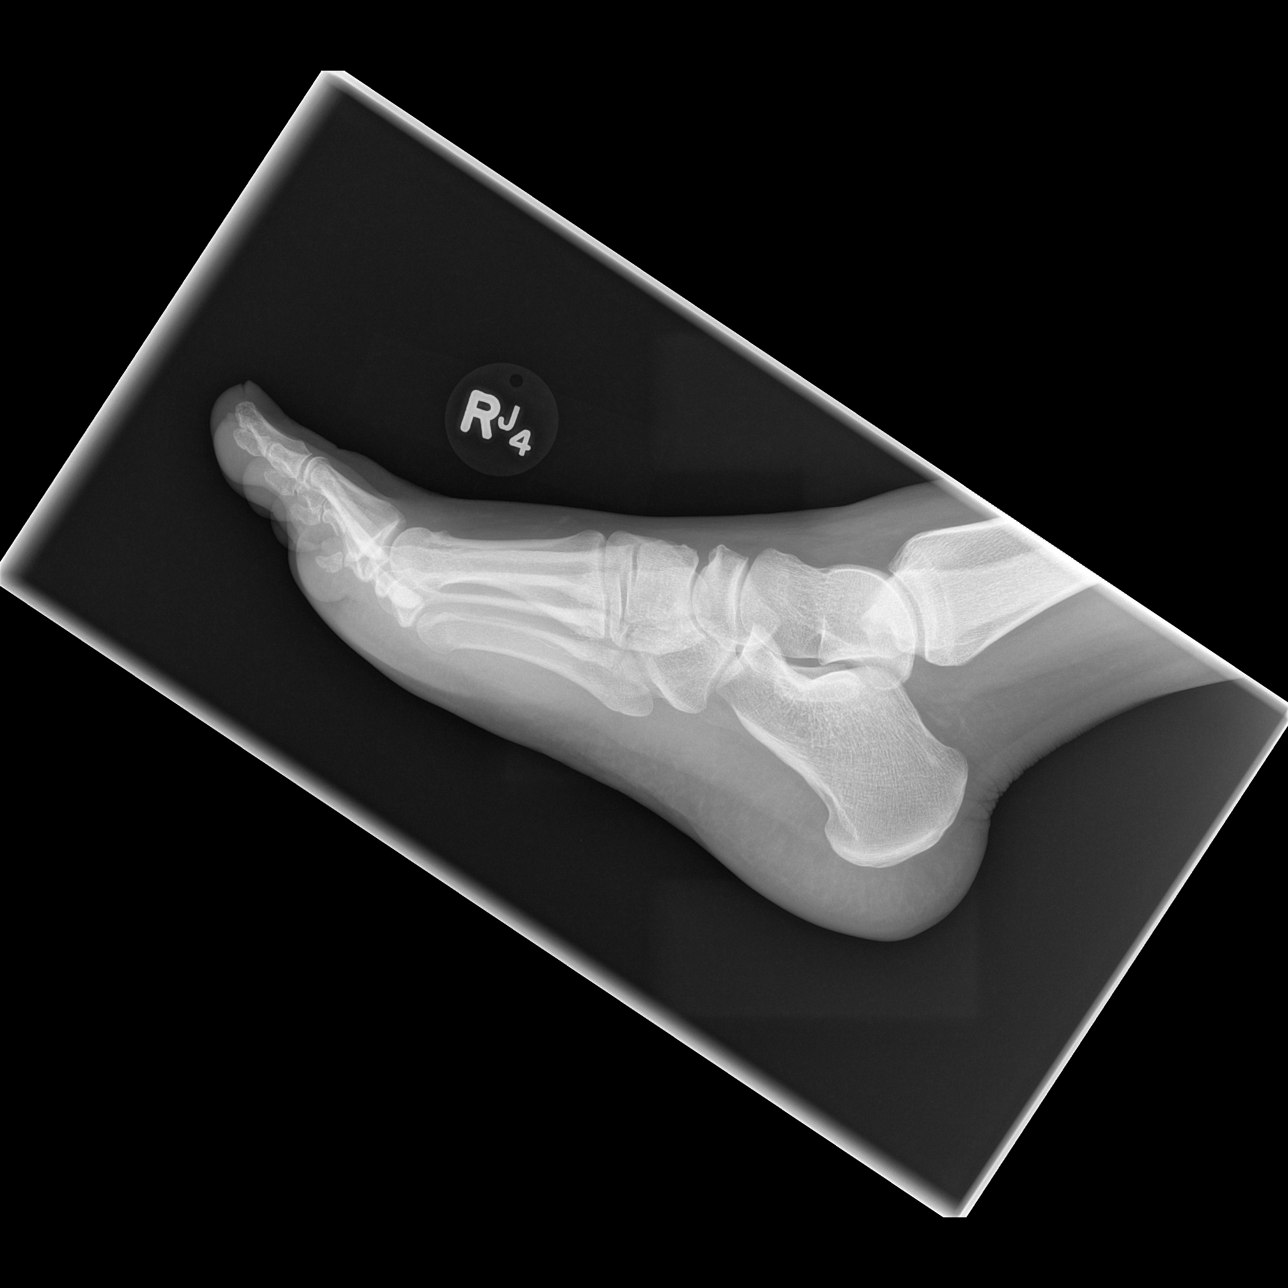

[3 of 3 positions shown; findings below may reference images not displayed]

FINDINGS: There is no fracture, dislocation, or joint effusion.  No
arthritis.
IMPRESSION: Normal exam.

## 2013-04-25 ENCOUNTER — Ambulatory Visit: Admitting: Obstetrics

## 2014-05-12 ENCOUNTER — Encounter (HOSPITAL_COMMUNITY): Payer: Self-pay

## 2015-07-12 DIAGNOSIS — T8339XA Other mechanical complication of intrauterine contraceptive device, initial encounter: Secondary | ICD-10-CM

## 2015-07-12 HISTORY — DX: Other mechanical complication of intrauterine contraceptive device, initial encounter: T83.39XA

## 2015-07-12 HISTORY — PX: ABDOMINAL SURGERY: SHX537

## 2016-08-24 DIAGNOSIS — F4329 Adjustment disorder with other symptoms: Secondary | ICD-10-CM | POA: Insufficient documentation

## 2016-11-30 ENCOUNTER — Encounter: Payer: Self-pay | Admitting: Emergency Medicine

## 2016-11-30 ENCOUNTER — Emergency Department
Admission: EM | Admit: 2016-11-30 | Discharge: 2016-11-30 | Disposition: A | Discharge status: Home / Self Care | Attending: Emergency Medicine | Admitting: Emergency Medicine

## 2016-11-30 DIAGNOSIS — Y999 Unspecified external cause status: Secondary | ICD-10-CM | POA: Insufficient documentation

## 2016-11-30 DIAGNOSIS — X100XXA Contact with hot drinks, initial encounter: Secondary | ICD-10-CM | POA: Insufficient documentation

## 2016-11-30 DIAGNOSIS — T300 Burn of unspecified body region, unspecified degree: Secondary | ICD-10-CM

## 2016-11-30 DIAGNOSIS — Y939 Activity, unspecified: Secondary | ICD-10-CM | POA: Insufficient documentation

## 2016-11-30 DIAGNOSIS — T2112XA Burn of first degree of abdominal wall, initial encounter: Secondary | ICD-10-CM | POA: Insufficient documentation

## 2016-11-30 DIAGNOSIS — F17210 Nicotine dependence, cigarettes, uncomplicated: Secondary | ICD-10-CM | POA: Diagnosis not present

## 2016-11-30 DIAGNOSIS — Y929 Unspecified place or not applicable: Secondary | ICD-10-CM | POA: Diagnosis not present

## 2016-11-30 MED ORDER — LIDOCAINE HCL 2 % EX GEL
1.0000 "application " | Freq: Once | CUTANEOUS | Status: AC
  Administered 2016-11-30: 1 via TOPICAL

## 2016-11-30 MED ORDER — ACETAMINOPHEN-CODEINE #3 300-30 MG PO TABS: 1.0000 | ORAL_TABLET | Freq: Three times a day (TID) | ORAL | 0 refills | Status: DC | PRN

## 2016-11-30 MED ORDER — LIDOCAINE HCL 2 % EX GEL
CUTANEOUS | Status: AC
  Filled 2016-11-30: qty 5

## 2016-11-30 NOTE — ED Triage Notes (Signed)
Pt ambulatory to triage with steady gait, no distress noted. Pt reports she had hot marinara sauce spilled onto abdomen through clothing this afternoon. On assessment, pt skin is intact, slight redness noted to area on abdomen.

## 2016-11-30 NOTE — Discharge Instructions (Signed)
Keep the wound clean, dry, and covered. Consider using an OTC burn gel bandage. Take the pain medicine as needed.

## 2016-12-01 NOTE — ED Provider Notes (Signed)
[facility] Emergency Department Provider Note ____________________________________________  Time seen: 2122  I have reviewed the triage vital signs and the nursing notes.  HISTORY  Chief Complaint  Burn  HPI Brenda Navarro is a 31 y.o. female presents to the ED for evaluation of a thermal burn to her abdomen. She described spilling a take-out container of hot marinara sauce onto her body from a [employer]. She splashed the sauce onto her shirt. She presents now with redness to a circular area on her abdomen.   Past Medical History:  Diagnosis Date  . Abnormal Pap smear   . Medical history non-contributory     Patient Active Problem List   Diagnosis Date Noted  . Active labor 04/02/2013    Past Surgical History:  Procedure Laterality Date  . COLPOSCOPY  2011  . NO PAST SURGERIES      Prior to Admission medications   Medication Sig Start Date End Date Taking? Authorizing Provider  acetaminophen-codeine (TYLENOL #3) 300-30 MG tablet Take 1 tablet by mouth every 8 (eight) hours as needed for moderate pain. 11/30/16   Tayllor Breitenstein, Charlesetta IvoryJenise V Bacon, PA-C  calcium carbonate (TUMS - DOSED IN MG ELEMENTAL CALCIUM) 500 MG chewable tablet Chew 2 tablets by mouth daily as needed for heartburn.    [provider]  ibuprofen (ADVIL,MOTRIN) 600 MG tablet Take 1 tablet (600 mg total) by mouth every 6 (six) hours as needed for pain. 04/04/13   Brock BadHarper, Charles A, MD  oxyCODONE-acetaminophen (PERCOCET/ROXICET) 5-325 MG per tablet Take 1-2 tablets by mouth every 4 (four) hours as needed for pain. 04/04/13   Brock BadHarper, Charles A, MD  Prenatal Vit-Fe Fumarate-FA (PRENATAL MULTIVITAMIN) TABS Take 1 tablet by mouth daily at 12 noon.    [provider]    Allergies Patient has no known allergies.  History reviewed. No pertinent family history.  Social History Social History  Substance Use Topics  . Smoking status: Current Every Day Smoker    Packs/day: 0.50     Years: 10.00    Types: Cigarettes  . Smokeless tobacco: Never Used  . Alcohol use No    Review of Systems  Constitutional: Negative for fever. Cardiovascular: Negative for chest pain. Respiratory: Negative for shortness of breath. Gastrointestinal: Negative for abdominal pain, vomiting and diarrhea. Skin: Negative for rash. Positive for burn as above.  Neurological: Negative for headaches, focal weakness or numbness. ____________________________________________  PHYSICAL EXAM:  VITAL SIGNS: ED Triage Vitals  Enc Vitals Group     BP 11/30/16 1954 118/76     Pulse Rate 11/30/16 1954 86     Resp 11/30/16 1954 16     Temp 11/30/16 1954 98.1 F (36.7 C)     Temp Source 11/30/16 1954 Oral     SpO2 11/30/16 1954 99 %     Weight 11/30/16 1954 224 lb (101.6 kg)     Height 11/30/16 1954 5\' 4"  (1.626 m)     Head Circumference --      Peak Flow --      Pain Score 11/30/16 2137 3     Pain Loc --      Pain Edu? --      Excl. in GC? --     Constitutional: Alert and oriented. Well appearing and in no distress. Head: Normocephalic and atraumatic. Cardiovascular: Normal rate, regular rhythm. Normal distal pulses. Respiratory: Normal respiratory effort. No wheezes/rales/rhonchi. Gastrointestinal: Soft and nontender. Welll-circumscribed area of superficial erythema without blister formation .  Musculoskeletal: Nontender with  normal range of motion in all extremities.  Skin:  Skin is warm, dry and intact. No rash noted. Burn noted above.  ____________________________________________  PROCEDURES  Lidocaine 2% jelly - topically Telfa Dressing ____________________________________________  INITIAL IMPRESSION / ASSESSMENT AND PLAN / ED COURSE  Patient with an ED evaluation of a thermal burn to the abdomen secondary to a hot condiment splash on the abdomen. She has no blister formation at this time for what appears to be a first-degree burn to the skin. She is given instructions on  wound care management and will follow up with her primary care provider as needed. A prescription for Tylenol number threes provider for moderate pain relief. ____________________________________________  FINAL CLINICAL IMPRESSION(S) / ED DIAGNOSES  Final diagnoses:  Burn      Lissa Hoard, PA-C 12/01/16 0038    Charlynne Pander, MD 12/02/16 [phone]

## 2018-02-10 ENCOUNTER — Encounter (HOSPITAL_COMMUNITY): Payer: Self-pay | Admitting: Emergency Medicine

## 2018-02-10 ENCOUNTER — Ambulatory Visit (HOSPITAL_COMMUNITY)
Admission: EM | Admit: 2018-02-10 | Discharge: 2018-02-10 | Disposition: A | Discharge status: Home / Self Care | Attending: Family Medicine | Admitting: Family Medicine

## 2018-02-10 DIAGNOSIS — L03011 Cellulitis of right finger: Secondary | ICD-10-CM

## 2018-02-10 MED ORDER — SULFAMETHOXAZOLE-TRIMETHOPRIM 800-160 MG PO TABS: 1.0000 | ORAL_TABLET | Freq: Two times a day (BID) | ORAL | 0 refills | Status: AC

## 2018-02-10 NOTE — ED Triage Notes (Signed)
Pt c/o swelling to her R thumb, thumb is very swollen with streaks running up her arm.

## 2018-02-12 NOTE — ED Provider Notes (Signed)
[facility]   [id number] 02/10/18 Arrival Time: 1531  ASSESSMENT & PLAN:  1. Paronychia of finger of right hand     Meds ordered this encounter  Medications  . sulfamethoxazole-trimethoprim (BACTRIM DS,SEPTRA DS) 800-160 MG tablet    Sig: Take 1 tablet by mouth 2 (two) times daily for 10 days.    Dispense:  20 tablet    Refill:  0    Procedure: Verbal consent obtained. R thumb cleaned with betadine. Mist spray for topical anesthesia. #11 blade to make incision at rear nail fold with expression of purulent material. Minimal bleeding. No complications.  Wound care instructions discussed and given in written format. To return in 48 hours for wound check if she feels the need.  Finish all antibiotics. OTC analgesics as needed.  Reviewed expectations re: course of current medical issues. Questions answered. Outlined signs and symptoms indicating need for more acute intervention. Patient verbalized understanding. After Visit Summary given.   SUBJECTIVE:  Brenda Navarro is a 32 y.o. female who presents with a "swollen thumb". Present over the past few days. Painful. Area around R thumbnail red and swollen. No injury. No drainage. Afebrile. No home treatment. No specific aggravating or alleviating factors reported.  ROS: As per HPI.  OBJECTIVE:  Vitals:   02/10/18 1634  BP: (!) 144/84  Pulse: 89  Resp: 16  Temp: 98.6 F (37 C)  SpO2: 99%     General appearance: alert; no distress Skin: paronychia of R thumb; very tender; overlying skin erythema; warm; nail intact Psychological: alert and cooperative; normal mood and affect  No Known Allergies  Past Medical History:  Diagnosis Date  . Abnormal Pap smear   . Medical history non-contributory    Social History   Socioeconomic History  . Marital status: Married    Spouse name: Not on file  . Number of children: Not on file  . Years of education: Not on file  . Highest education level: Not on file    Occupational History  . Not on file  Social Needs  . Financial resource strain: Not on file  . Food insecurity:    Worry: Not on file    Inability: Not on file  . Transportation needs:    Medical: Not on file    Non-medical: Not on file  Tobacco Use  . Smoking status: Current Every Day Smoker    Packs/day: 0.50    Years: 10.00    Pack years: 5.00    Types: Cigarettes  . Smokeless tobacco: Never Used  Substance and Sexual Activity  . Alcohol use: No  . Drug use: No  . Sexual activity: Yes  Lifestyle  . Physical activity:    Days per week: Not on file    Minutes per session: Not on file  . Stress: Not on file  Relationships  . Social connections:    Talks on phone: Not on file    Gets together: Not on file    Attends religious service: Not on file    Active member of club or organization: Not on file    Attends meetings of clubs or organizations: Not on file    Relationship status: Not on file  Other Topics Concern  . Not on file  Social History Narrative  . Not on file   No family history on file. Past Surgical History:  Procedure Laterality Date  . COLPOSCOPY  2011  . NO PAST SURGERIES  Mardella LaymanHagler, Vernelle Wisner, MD 02/12/18 [phone]

## 2019-02-04 ENCOUNTER — Ambulatory Visit (HOSPITAL_COMMUNITY)
Admission: EM | Admit: 2019-02-04 | Discharge: 2019-02-04 | Disposition: A | Discharge status: Home / Self Care | Payer: Self-pay | Attending: Emergency Medicine | Admitting: Emergency Medicine

## 2019-02-04 ENCOUNTER — Encounter (HOSPITAL_COMMUNITY): Payer: Self-pay

## 2019-02-04 ENCOUNTER — Other Ambulatory Visit: Payer: Self-pay

## 2019-02-04 DIAGNOSIS — J039 Acute tonsillitis, unspecified: Secondary | ICD-10-CM

## 2019-02-04 LAB — POCT RAPID STREP A: Streptococcus, Group A Screen (Direct): NEGATIVE

## 2019-02-04 MED ORDER — AMOXICILLIN 500 MG PO CAPS: 500.0000 mg | ORAL_CAPSULE | Freq: Two times a day (BID) | ORAL | 0 refills | Status: AC

## 2019-02-04 NOTE — ED Triage Notes (Signed)
Pt presents with tonsil inflammation on left side with redness and Brenda Navarro patches X 2 days.

## 2019-02-04 NOTE — Discharge Instructions (Addendum)
Take amoxicillin as prescribed. Return if you develop worsening fever, sore throat, chest pain, difficulty breathing.

## 2019-02-04 NOTE — ED Provider Notes (Signed)
[facility]    CSN: [id number] Arrival date & time: 02/04/19  1629     History   Chief Complaint Chief Complaint  Patient presents with  . Appointment  . (4:50 Tonsil Inflammation)    HPI Brenda Navarro is a 33 y.o. female presenting for 2-day course of left-sided throat pain that radiates to her left ear.  Patient denies upper respiratory symptoms, change in hearing, dental infection, history of strep or tonsillar abscess.  Patient has not tried anything for pain, denies exacerbating or alleviating factors.    Past Medical History:  Diagnosis Date  . Abnormal Pap smear   . Medical history non-contributory     Patient Active Problem List   Diagnosis Date Noted  . Active labor 04/02/2013    Past Surgical History:  Procedure Laterality Date  . COLPOSCOPY  2011  . NO PAST SURGERIES      OB History    Gravida  2   Para  2   Term  2   Preterm      AB      Living  2     SAB      TAB      Ectopic      Multiple      Live Births  2            Home Medications    Prior to Admission medications   Medication Sig Start Date End Date Taking? Authorizing Provider  acetaminophen-codeine (TYLENOL #3) 300-30 MG tablet Take 1 tablet by mouth every 8 (eight) hours as needed for moderate pain. 11/30/16   Menshew, Dannielle Karvonen, PA-C  amoxicillin (AMOXIL) 500 MG capsule Take 1 capsule (500 mg total) by mouth 2 (two) times daily for 10 days. 02/04/19 02/14/19  Hall-Potvin, [place], PA-C  calcium carbonate (TUMS - DOSED IN MG ELEMENTAL CALCIUM) 500 MG chewable tablet Chew 2 tablets by mouth daily as needed for heartburn.    [provider]  ibuprofen (ADVIL,MOTRIN) 600 MG tablet Take 1 tablet (600 mg total) by mouth every 6 (six) hours as needed for pain. Patient not taking: Reported on 02/10/2018 04/04/13   Shelly Bombard, MD  Prenatal Vit-Fe Fumarate-FA (PRENATAL MULTIVITAMIN) TABS Take 1 tablet by mouth daily at 12 noon.    [provider]    Family History Family History  Family history unknown: Yes    Social History Social History   Tobacco Use  . Smoking status: Current Every Day Smoker    Packs/day: 0.50    Years: 10.00    Pack years: 5.00    Types: Cigarettes  . Smokeless tobacco: Never Used  Substance Use Topics  . Alcohol use: No  . Drug use: No     Allergies   Patient has no known allergies.   Review of Systems Review of Systems  Constitutional: Negative for fatigue and fever.  HENT: Positive for ear pain, sore throat and trouble swallowing. Negative for congestion, dental problem, drooling, ear discharge, facial swelling, hearing loss, mouth sores, nosebleeds, postnasal drip, rhinorrhea, sinus pressure, sinus pain, sneezing, tinnitus and voice change.   Eyes: Negative for pain, redness and visual disturbance.  Respiratory: Negative for cough and shortness of breath.   Cardiovascular: Negative for chest pain and palpitations.  Gastrointestinal: Negative for abdominal pain, diarrhea and vomiting.  Musculoskeletal: Negative for arthralgias and myalgias.  Skin: Negative for rash and wound.  Neurological: Negative for syncope and headaches.     Physical Exam Triage  Vital Signs ED Triage Vitals  Enc Vitals Group     BP      Pulse      Resp      Temp      Temp src      SpO2      Weight      Height      Head Circumference      Peak Flow      Pain Score      Pain Loc      Pain Edu?      Excl. in GC?    No data found.  Updated Vital Signs BP 131/76 (BP Location: Right Arm)   Pulse 91   Temp 99.2 F (37.3 C) (Oral)   Resp 18   LMP 01/05/2019   SpO2 99%   Visual Acuity Right Eye Distance:   Left Eye Distance:   Bilateral Distance:    Right Eye Near:   Left Eye Near:    Bilateral Near:     Physical Exam Constitutional:      General: She is not in acute distress. HENT:     Head: Normocephalic and atraumatic.     Right Ear: Tympanic membrane, ear canal and  external ear normal.     Left Ear: Tympanic membrane and ear canal normal.     Nose: Nose normal.     Mouth/Throat:     Mouth: Mucous membranes are moist.     Pharynx: Posterior oropharyngeal erythema present. No oropharyngeal exudate.     Comments: Right tonsil 2+, left tonsil 3+ with large, patchy exudate.  Uvula is midline, nonedematous Eyes:     General: No scleral icterus.    Conjunctiva/sclera: Conjunctivae normal.     Pupils: Pupils are equal, round, and reactive to light.  Neck:     Musculoskeletal: Normal range of motion and neck supple. Muscular tenderness present.     Comments: Left-sided anterior cervical lymphadenopathy that is tender Cardiovascular:     Rate and Rhythm: Normal rate.  Pulmonary:     Effort: Pulmonary effort is normal.  Lymphadenopathy:     Cervical: Cervical adenopathy present.  Skin:    Coloration: Skin is not jaundiced or pale.  Neurological:     Mental Status: She is alert and oriented to person, place, and time.      UC Treatments / Results  Labs (all labs ordered are listed, but only abnormal results are displayed) Labs Reviewed  CULTURE, GROUP A STREP [facility])  POCT RAPID STREP A    EKG   Radiology No results found.  Procedures Procedures (including critical care time)  Medications Ordered in UC Medications - No data to display  Initial Impression / Assessment and Plan / UC Course  I have reviewed the triage vital signs and the nursing notes.  Pertinent labs & imaging results that were available during my care of the patient were reviewed by me and considered in my medical decision making (see chart for details).     1.  Tonsillitis Rapid strep negative, culture pending.  Due to unilateral tonsillar edema with exudate, elevated temperature, lack of cough, tender anterior left-sided cervical lymphadenopathy will treat for tonsillitis with amoxicillin as listed below.  Return precautions discussed, patient verbalized  understanding and is agreeable to plan. Final Clinical Impressions(s) / UC Diagnoses   Final diagnoses:  Tonsillitis     Discharge Instructions     Take amoxicillin as prescribed. Return if you develop worsening fever, sore throat, chest  pain, difficulty breathing.    ED Prescriptions    Medication Sig Dispense Auth. Provider   amoxicillin (AMOXIL) 500 MG capsule Take 1 capsule (500 mg total) by mouth 2 (two) times daily for 10 days. 20 capsule Hall-Potvin, [place], PA-C     Controlled Substance Prescriptions [place] Controlled Substance Registry consulted? Not Applicable   Shea EvansHall-Potvin, Brittany, [place] JerseyPA-C 02/04/19 1811

## 2019-02-07 LAB — CULTURE, GROUP A STREP (THRC)

## 2019-03-25 ENCOUNTER — Other Ambulatory Visit: Payer: Self-pay

## 2019-03-25 DIAGNOSIS — Z20822 Contact with and (suspected) exposure to covid-19: Secondary | ICD-10-CM

## 2019-03-26 LAB — NOVEL CORONAVIRUS, NAA: SARS-CoV-2, NAA: NOT DETECTED

## 2019-09-24 ENCOUNTER — Other Ambulatory Visit: Payer: Self-pay

## 2019-09-25 ENCOUNTER — Other Ambulatory Visit (HOSPITAL_COMMUNITY)
Admission: RE | Admit: 2019-09-25 | Discharge: 2019-09-25 | Disposition: A | Discharge status: Home / Self Care | Payer: No Typology Code available for payment source | Source: Ambulatory Visit | Attending: Nurse Practitioner | Admitting: Nurse Practitioner

## 2019-09-25 ENCOUNTER — Encounter: Payer: Self-pay | Admitting: Nurse Practitioner

## 2019-09-25 ENCOUNTER — Ambulatory Visit (INDEPENDENT_AMBULATORY_CARE_PROVIDER_SITE_OTHER): Payer: No Typology Code available for payment source | Admitting: Nurse Practitioner

## 2019-09-25 VITALS — BP 122/82 | HR 83 | Temp 97.7°F | Wt 252.2 lb

## 2019-09-25 DIAGNOSIS — Z6841 Body Mass Index (BMI) 40.0 and over, adult: Secondary | ICD-10-CM

## 2019-09-25 DIAGNOSIS — Z136 Encounter for screening for cardiovascular disorders: Secondary | ICD-10-CM

## 2019-09-25 DIAGNOSIS — Z113 Encounter for screening for infections with a predominantly sexual mode of transmission: Secondary | ICD-10-CM | POA: Insufficient documentation

## 2019-09-25 DIAGNOSIS — R3915 Urgency of urination: Secondary | ICD-10-CM

## 2019-09-25 DIAGNOSIS — Z30011 Encounter for initial prescription of contraceptive pills: Secondary | ICD-10-CM | POA: Diagnosis not present

## 2019-09-25 DIAGNOSIS — Z0001 Encounter for general adult medical examination with abnormal findings: Secondary | ICD-10-CM

## 2019-09-25 DIAGNOSIS — Z13220 Encounter for screening for lipoid disorders: Secondary | ICD-10-CM

## 2019-09-25 DIAGNOSIS — Z124 Encounter for screening for malignant neoplasm of cervix: Secondary | ICD-10-CM

## 2019-09-25 DIAGNOSIS — Z716 Tobacco abuse counseling: Secondary | ICD-10-CM | POA: Diagnosis not present

## 2019-09-25 LAB — CBC
HCT: 38.8 % (ref 36.0–46.0)
Hemoglobin: 12.5 g/dL (ref 12.0–15.0)
MCHC: 32.2 g/dL (ref 30.0–36.0)
MCV: 83.5 fl (ref 78.0–100.0)
Platelets: 329 10*3/uL (ref 150.0–400.0)
RBC: 4.65 Mil/uL (ref 3.87–5.11)
RDW: 14.9 % (ref 11.5–15.5)
WBC: 8.8 10*3/uL (ref 4.0–10.5)

## 2019-09-25 LAB — COMPREHENSIVE METABOLIC PANEL
ALT: 8 U/L (ref 0–35)
AST: 11 U/L (ref 0–37)
Albumin: 3.7 g/dL (ref 3.5–5.2)
Alkaline Phosphatase: 76 U/L (ref 39–117)
BUN: 9 mg/dL (ref 6–23)
CO2: 27 mEq/L (ref 19–32)
Calcium: 8.9 mg/dL (ref 8.4–10.5)
Chloride: 109 mEq/L (ref 96–112)
Creatinine, Ser: 0.78 mg/dL (ref 0.40–1.20)
GFR: 103.4 mL/min (ref >60.00)
Glucose, Bld: 103 mg/dL — ABNORMAL HIGH (ref 70–99)
Potassium: 4 mEq/L (ref 3.5–5.1)
Sodium: 139 mEq/L (ref 135–145)
Total Bilirubin: 0.3 mg/dL (ref 0.2–1.2)
Total Protein: 6.6 g/dL (ref 6.0–8.3)

## 2019-09-25 LAB — LIPID PANEL
Cholesterol: 138 mg/dL (ref 0–200)
HDL: 40.5 mg/dL (ref >39.00)
LDL Cholesterol: 75 mg/dL (ref 0–99)
NonHDL: 97.31
Total CHOL/HDL Ratio: 3
Triglycerides: 114 mg/dL (ref 0.0–149.0)
VLDL: 22.8 mg/dL (ref 0.0–40.0)

## 2019-09-25 LAB — TSH: TSH: 1.05 u[IU]/mL (ref 0.35–4.50)

## 2019-09-25 LAB — POCT URINE PREGNANCY: Preg Test, Ur: NEGATIVE

## 2019-09-25 MED ORDER — BUPROPION HCL ER (SR) 150 MG PO TB12: ORAL_TABLET | ORAL | 5 refills | Status: DC

## 2019-09-25 MED ORDER — NORETHINDRONE ACET-ETHINYL EST 1-20 MG-MCG PO TABS: 1.0000 | ORAL_TABLET | Freq: Every day | ORAL | 3 refills | Status: DC

## 2019-09-25 NOTE — Patient Instructions (Addendum)
Thank you for choosing [facility] for your health needs.  Normal lab results zyban and Junel sent. F/up in 69month  Bupropion sustained-release tablets (smoking cessation) What is this medicine? BUPROPION (byoo PROE pee on) is used to help people quit smoking. This medicine may be used for other purposes; ask your health care provider or pharmacist if you have questions. COMMON BRAND NAME(S): Buproban, Zyban What should I tell my health care provider before I take this medicine? They need to know if you have any of these conditions:  an eating disorder, such as anorexia or bulimia  bipolar disorder or psychosis  diabetes or high blood sugar, treated with medication  glaucoma  head injury or brain tumor  heart disease, previous heart attack, or irregular heart beat  high blood pressure  kidney or liver disease  seizures  suicidal thoughts or a previous suicide attempt  Tourette's syndrome  weight loss  an unusual or allergic reaction to bupropion, other medicines, foods, dyes, or preservatives  breast-feeding  pregnant or trying to become pregnant How should I use this medicine? Take this medicine by mouth with a glass of water. Follow the directions on the prescription label. You can take it with or without food. If it upsets your stomach, take it with food. Do not cut, crush or chew this medicine. Take your medicine at regular intervals. If you take this medicine more than once a day, take your second dose at least 8 hours after you take your first dose. To limit difficulty in sleeping, avoid taking this medicine at bedtime. Do not take your medicine more often than directed. Do not stop taking this medicine suddenly except upon the advice of your doctor. Stopping this medicine too quickly may cause serious side effects. A special MedGuide will be given to you by the pharmacist with each prescription and refill. Be sure to read this information carefully each  time. Talk to your pediatrician regarding the use of this medicine in children. Special care may be needed. Overdosage: If you think you have taken too much of this medicine contact a poison control center or emergency room at once. NOTE: This medicine is only for you. Do not share this medicine with others. What if I miss a dose? If you miss a dose, skip the missed dose and take your next tablet at the regular time. There should be at least 8 hours between doses. Do not take double or extra doses. What may interact with this medicine? Do not take this medicine with any of the following medications:  linezolid  MAOIs like Azilect, Carbex, Eldepryl, Marplan, Nardil, and Parnate  methylene blue (injected into a vein)  other medicines that contain bupropion like Wellbutrin This medicine may also interact with the following medications:  alcohol  certain medicines for anxiety or sleep  certain medicines for blood pressure like metoprolol, propranolol  certain medicines for depression or psychotic disturbances  certain medicines for HIV or AIDS like efavirenz, lopinavir, nelfinavir, ritonavir  certain medicines for irregular heart beat like propafenone, flecainide  certain medicines for Parkinson's disease like amantadine, levodopa  certain medicines for seizures like carbamazepine, phenytoin, phenobarbital  cimetidine  clopidogrel  cyclophosphamide  digoxin  furazolidone  isoniazid  nicotine  orphenadrine  procarbazine  steroid medicines like prednisone or cortisone  stimulant medicines for attention disorders, weight loss, or to stay awake  tamoxifen  theophylline  thiotepa  ticlopidine  tramadol  warfarin This list may not describe all possible interactions. Give  your health care provider a list of all the medicines, herbs, non-prescription drugs, or dietary supplements you use. Also tell them if you smoke, drink alcohol, or use illegal drugs. Some  items may interact with your medicine. What should I watch for while using this medicine? Visit your doctor or healthcare provider for regular checks on your progress. This medicine should be used together with a patient support program. It is important to participate in a behavioral program, counseling, or other support program that is recommended by your healthcare provider. This medicine may cause serious skin reactions. They can happen weeks to months after starting the medicine. Contact your healthcare provider right away if you notice fevers or flu-like symptoms with a rash. The rash may be red or purple and then turn into blisters or peeling of the skin. Or, you might notice a red rash with swelling of the face, lips or lymph nodes in your neck or under your arms. Patients and their families should watch out for new or worsening thoughts of suicide or depression. Also watch out for sudden changes in feelings such as feeling anxious, agitated, panicky, irritable, hostile, aggressive, impulsive, severely restless, overly excited and hyperactive, or not being able to sleep. If this happens, especially at the beginning of treatment or after a change in dose, call your healthcare provider. Avoid alcoholic drinks while taking this medicine. Drinking excessive alcoholic beverages, using sleeping or anxiety medicines, or quickly stopping the use of these agents while taking this medicine may increase your risk for a seizure. Do not drive or use heavy machinery until you know how this medicine affects you. This medicine can impair your ability to perform these tasks. Do not take this medicine close to bedtime. It may prevent you from sleeping. Your mouth may get dry. Chewing sugarless gum or sucking hard candy, and drinking plenty of water may help. Contact your doctor if the problem does not go away or is severe. Do not use nicotine patches or chewing gum without the advice of your doctor or healthcare  provider while taking this medicine. You may need to have your blood pressure taken regularly if your doctor recommends that you use both nicotine and this medicine together. What side effects may I notice from receiving this medicine? Side effects that you should report to your doctor or health care professional as soon as possible:  allergic reactions like skin rash, itching or hives, swelling of the face, lips, or tongue  breathing problems  changes in vision  confusion  elevated mood, decreased need for sleep, racing thoughts, impulsive behavior  fast or irregular heartbeat  hallucinations, loss of contact with reality  increased blood pressure  rash, fever, and swollen lymph nodes  redness, blistering, peeling, or loosening of the skin, including inside the mouth  seizures  suicidal thoughts or other mood changes  unusually weak or tired  vomiting Side effects that usually do not require medical attention (report to your doctor or health care professional if they continue or are bothersome):  constipation  headache  loss of appetite  nausea  tremors  weight loss This list may not describe all possible side effects. Call your doctor for medical advice about side effects. You may report side effects to FDA at 1-800-FDA-1088. Where should I keep my medicine? Keep out of the reach of children. Store at room temperature between 20 and 25 degrees C (68 and 77 degrees F). Protect from light. Keep container tightly closed. Throw away any unused medicine  after the expiration date. NOTE: This sheet is a summary. It may not cover all possible information. If you have questions about this medicine, talk to your doctor, pharmacist, or health care provider.  2020 Elsevier/Gold Standard (2018-09-20 13:59:09)   Preventive Care 67-45 Years Old, Female Preventive care refers to visits with your health care provider and lifestyle choices that can promote health and wellness.  This includes:  A yearly physical exam. This may also be called an annual well check.  Regular dental visits and eye exams.  Immunizations.  Screening for certain conditions.  Healthy lifestyle choices, such as eating a healthy diet, getting regular exercise, not using drugs or products that contain nicotine and tobacco, and limiting alcohol use. What can I expect for my preventive care visit? Physical exam Your health care provider will check your:  Height and weight. This may be used to calculate body mass index (BMI), which tells if you are at a healthy weight.  Heart rate and blood pressure.  Skin for abnormal spots. Counseling Your health care provider may ask you questions about your:  Alcohol, tobacco, and drug use.  Emotional well-being.  Home and relationship well-being.  Sexual activity.  Eating habits.  Work and work Statistician.  Method of birth control.  Menstrual cycle.  Pregnancy history. What immunizations do I need?  Influenza (flu) vaccine  This is recommended every year. Tetanus, diphtheria, and pertussis (Tdap) vaccine  You may need a Td booster every 10 years. Varicella (chickenpox) vaccine  You may need this if you have not been vaccinated. Human papillomavirus (HPV) vaccine  If recommended by your health care provider, you may need three doses over 6 months. Measles, mumps, and rubella (MMR) vaccine  You may need at least one dose of MMR. You may also need a second dose. Meningococcal conjugate (MenACWY) vaccine  One dose is recommended if you are age 41-21 years and a first-year college student living in a residence hall, or if you have one of several medical conditions. You may also need additional booster doses. Pneumococcal conjugate (PCV13) vaccine  You may need this if you have certain conditions and were not previously vaccinated. Pneumococcal polysaccharide (PPSV23) vaccine  You may need one or two doses if you smoke  cigarettes or if you have certain conditions. Hepatitis A vaccine  You may need this if you have certain conditions or if you travel or work in places where you may be exposed to hepatitis A. Hepatitis B vaccine  You may need this if you have certain conditions or if you travel or work in places where you may be exposed to hepatitis B. Haemophilus influenzae type b (Hib) vaccine  You may need this if you have certain conditions. You may receive vaccines as individual doses or as more than one vaccine together in one shot (combination vaccines). Talk with your health care provider about the risks and benefits of combination vaccines. What tests do I need?  Blood tests  Lipid and cholesterol levels. These may be checked every 5 years starting at age 83.  Hepatitis C test.  Hepatitis B test. Screening  Diabetes screening. This is done by checking your blood sugar (glucose) after you have not eaten for a while (fasting).  Sexually transmitted disease (STD) testing.  BRCA-related cancer screening. This may be done if you have a family history of breast, ovarian, tubal, or peritoneal cancers.  Pelvic exam and Pap test. This may be done every 3 years starting at age 48. Starting  at age 87, this may be done every 5 years if you have a Pap test in combination with an HPV test. Talk with your health care provider about your test results, treatment options, and if necessary, the need for more tests. Follow these instructions at home: Eating and drinking   Eat a diet that includes fresh fruits and vegetables, whole grains, lean protein, and low-fat dairy.  Take vitamin and mineral supplements as recommended by your health care provider.  Do not drink alcohol if: ? Your health care provider tells you not to drink. ? You are pregnant, may be pregnant, or are planning to become pregnant.  If you drink alcohol: ? Limit how much you have to 0-1 drink a day. ? Be aware of how much alcohol  is in your drink. In the [place] one drink equals one 12 oz bottle of beer (355 mL), one 5 oz glass of wine (148 mL), or one 1 oz glass of hard liquor (44 mL). Lifestyle  Take daily care of your teeth and gums.  Stay active. Exercise for at least 30 minutes on 5 or more days each week.  Do not use any products that contain nicotine or tobacco, such as cigarettes, e-cigarettes, and chewing tobacco. If you need help quitting, ask your health care provider.  If you are sexually active, practice safe sex. Use a condom or other form of birth control (contraception) in order to prevent pregnancy and STIs (sexually transmitted infections). If you plan to become pregnant, see your health care provider for a preconception visit. What's next?  Visit your health care provider once a year for a well check visit.  Ask your health care provider how often you should have your eyes and teeth checked.  Stay up to date on all vaccines. This information is not intended to replace advice given to you by your health care provider. Make sure you discuss any questions you have with your health care provider. Document Revised: 03/08/2018 Document Reviewed: 03/08/2018 Elsevier Patient Education  2020 [employer].

## 2019-09-25 NOTE — Progress Notes (Signed)
Subjective:    Patient ID: Brenda Navarro, female    DOB: 17-May-1986, 34 y.o.   MRN: [id number]  Patient presents today for complete physical or establish care (new patient) and discuss tobacco cessation and use of OCP.  Nicotine Dependence Presents for initial visit. Symptoms include cravings, headache and irritability. Symptoms are negative for decreased concentration, fatigue, insomnia and sore throat. Preferred tobacco types include cigarettes. Preferred cigarette types include filtered. Preferred strength is regular. Preferred cigarettes are menthol. Her urge triggers include company of smokers, contact with substance, meal time and stress. Her first smoke is from 6 to 8 AM. She smokes < 1/2 a pack of cigarettes per day. She started smoking when she was 43-16 years old. Past treatments include nothing. Airi is ready to quit. Latifah has tried to quit 0 times. There is no history of alcohol abuse and drug use.  Urinary Frequency  This is a new problem. The current episode started 1 to 4 weeks ago. The problem occurs every urination. The problem has been unchanged. The pain is at a severity of 0/10. The patient is experiencing no pain. There has been no fever. She is sexually active. There is no history of pyelonephritis. Associated symptoms include hesitancy and a possible pregnancy. Pertinent negatives include no chills, discharge, flank pain, frequency, hematuria, nausea, sweats, urgency or vomiting. She has tried nothing for the symptoms. There is no history of catheterization, kidney stones, recurrent UTIs, a single kidney, urinary stasis or a urological procedure.  she dienies hx of migraines or DVT or seizures. She is not interested in nicotine patches or chantix. She will like to use zyban.  Sexual History (orientation,birth control, marital status, STD):sexually active, no condom use, will like to take OCP, she is aware of her risk for DVT/PE with current tobacco use, she is not  interested in use of depoProvera injections or IUD or implant. She is adopted an no known siblings, unknown family history. She needs repeat PAP, breast exam and STD screen.  Depression/Suicide: Depression screen [facility] 2/9 09/25/2019  Decreased Interest 0  Down, Depressed, Hopeless 0  PHQ - 2 Score 0   Vision:up to date  Dental:up to date  Immunizations: (TDAP, Hep C screen, Pneumovax, Influenza, zoster)  Health Maintenance  Topic Date Due  . Tetanus Vaccine  Never done  . Pap Smear  04/11/2015  . Flu Shot  10/09/2019*  . HIV Screening  Completed  *Topic was postponed. The date shown is not the original due date.   Diet:regular.  Weight:  Wt Readings from Last 3 Encounters:  09/25/19 252 lb 3.2 oz (114.4 kg)  11/30/16 224 lb (101.6 kg)  04/02/13 225 lb (102.1 kg)   Exercise:none  Fall Risk: Fall Risk  09/25/2019  Number falls in past yr: 0  Injury with Fall? 0   Medications and allergies reviewed with patient and updated if appropriate.  Patient Active Problem List   Diagnosis Date Noted  . Morbid obesity ([place]) 09/25/2019  . Active labor 04/02/2013    No current outpatient medications on file prior to visit.   No current facility-administered medications on file prior to visit.    Past Medical History:  Diagnosis Date  . Abnormal Pap smear   . Medical history non-contributory     Past Surgical History:  Procedure Laterality Date  . COLPOSCOPY  2011  . NO PAST SURGERIES      Social History   Socioeconomic History  . Marital status: Divorced    Spouse  name: Not on file  . Number of children: Not on file  . Years of education: Not on file  . Highest education level: Not on file  Occupational History  . Not on file  Tobacco Use  . Smoking status: Current Every Day Smoker    Packs/day: 0.50    Years: 10.00    Pack years: 5.00    Types: Cigarettes  . Smokeless tobacco: Never Used  Substance and Sexual Activity  . Alcohol use: No  . Drug use: No    . Sexual activity: Yes  Other Topics Concern  . Not on file  Social History Narrative  . Not on file   Social Determinants of Health   Financial Resource Strain:   . Difficulty of Paying Living Expenses:   Food Insecurity:   . Worried About Programme researcher, broadcasting/film/video in the Last Year:   . Barista in the Last Year:   Transportation Needs:   . Freight forwarder (Medical):   Marland Kitchen Lack of Transportation (Non-Medical):   Physical Activity:   . Days of Exercise per Week:   . Minutes of Exercise per Session:   Stress:   . Feeling of Stress :   Social Connections:   . Frequency of Communication with Friends and Family:   . Frequency of Social Gatherings with Friends and Family:   . Attends Religious Services:   . Active Member of Clubs or Organizations:   . Attends Banker Meetings:   Marland Kitchen Marital Status:     Family History  Adopted: Yes  Family history unknown: Yes        Review of Systems  Constitutional: Positive for irritability. Negative for chills, fatigue, fever, malaise/fatigue and weight loss.  HENT: Negative for congestion and sore throat.   Eyes:       Negative for visual changes  Respiratory: Negative for cough and shortness of breath.   Cardiovascular: Negative for chest pain, palpitations and leg swelling.  Gastrointestinal: Negative for blood in stool, constipation, diarrhea, heartburn, nausea and vomiting.  Genitourinary: Positive for hesitancy. Negative for dysuria, flank pain, frequency, hematuria and urgency.  Musculoskeletal: Negative for falls, joint pain and myalgias.  Skin: Negative for rash.  Neurological: Negative for dizziness, sensory change and headaches.  Endo/Heme/Allergies: Does not bruise/bleed easily.  Psychiatric/Behavioral: Negative for decreased concentration, depression, substance abuse and suicidal ideas. The patient is not nervous/anxious and does not have insomnia.     Objective:   Vitals:   09/25/19 0926  BP:  122/82  Pulse: 83  Temp: 97.7 F (36.5 C)  SpO2: 97%    Body mass index is 43.29 kg/m.   Physical Examination:  Physical Exam Vitals and nursing note reviewed. Exam conducted with a chaperone present.  Constitutional:      General: She is not in acute distress. HENT:     Right Ear: Tympanic membrane, ear canal and external ear normal.     Left Ear: Tympanic membrane, ear canal and external ear normal.  Eyes:     General: No scleral icterus.    Extraocular Movements: Extraocular movements intact.     Conjunctiva/sclera: Conjunctivae normal.     Pupils: Pupils are equal, round, and reactive to light.  Neck:     Thyroid: No thyromegaly.  Cardiovascular:     Rate and Rhythm: Normal rate and regular rhythm.     Pulses: Normal pulses.     Heart sounds: Normal heart sounds.  Pulmonary:  Effort: Pulmonary effort is normal.     Breath sounds: Normal breath sounds.  Chest:     Breasts: Breasts are symmetrical.        Right: Normal. No mass, nipple discharge or skin change.        Left: Normal. No mass, nipple discharge or skin change.  Abdominal:     General: Bowel sounds are normal. There is no distension.     Palpations: Abdomen is soft.     Tenderness: There is no abdominal tenderness.  Genitourinary:    Labia:        Right: No rash, tenderness or lesion.        Left: No rash, tenderness or lesion.      Urethra: Prolapse present.     Vagina: Normal. No vaginal discharge.     Cervix: Normal.     Uterus: Normal.      Adnexa: Right adnexa normal and left adnexa normal.  Musculoskeletal:        General: No tenderness. Normal range of motion.     Cervical back: Normal range of motion and neck supple.     Right lower leg: No edema.     Left lower leg: No edema.  Lymphadenopathy:     Cervical: No cervical adenopathy.     Upper Body:     Right upper body: No supraclavicular, axillary or pectoral adenopathy.     Left upper body: No supraclavicular, axillary or pectoral  adenopathy.     Lower Body: No right inguinal adenopathy. No left inguinal adenopathy.  Skin:    General: Skin is warm and dry.  Neurological:     Mental Status: She is alert and oriented to person, place, and time.  Psychiatric:        Mood and Affect: Mood normal.        Behavior: Behavior normal.        Thought Content: Thought content normal.        Judgment: Judgment normal.     ASSESSMENT and PLAN: This visit occurred during the SARS-CoV-2 public health emergency.  Safety protocols were in place, including screening questions prior to the visit, additional usage of staff PPE, and extensive cleaning of exam room while observing appropriate contact time as indicated for disinfecting solutions.   Claudell was seen today for establish care.  Diagnoses and all orders for this visit:  Encounter for preventative adult health care exam with abnormal findings -     CBC -     Comprehensive metabolic panel -     Lipid panel -     Cytology - PAP( [place])  Encounter for initial prescription of contraceptive pills -     POCT urine pregnancy -     norethindrone-ethinyl estradiol (LOESTRIN) 1-20 MG-MCG tablet; Take 1 tablet by mouth daily.  Encounter for tobacco use cessation counseling -     buPROPion (WELLBUTRIN SR) 150 MG 12 hr tablet; Take 1 tablet (150 mg total) by mouth daily for 7 days, THEN 1 tablet (150 mg total) 2 (two) times daily for 23 days.  Encounter for lipid screening for cardiovascular disease -     Lipid panel  Morbid obesity (HCC) -     Vitamin D 1,25 dihydroxy -     TSH  BMI 40.0-44.9, adult (HCC) -     Vitamin D 1,25 dihydroxy -     TSH  Encounter for Papanicolaou smear for cervical cancer screening -     Cytology - PAP(  [place])  Urinary urgency -     Urinalysis w microscopic + reflex cultur  Screening examination for STD (sexually transmitted disease) -     Cervicovaginal ancillary only( [place]) -     HIV antibody (with reflex) -      RPR   No problem-specific Assessment & Plan notes found for this encounter.     Problem List Items Addressed This Visit      Other   Morbid obesity (HCC)   Relevant Orders   Vitamin D 1,25 dihydroxy   TSH    Other Visit Diagnoses    Encounter for preventative adult health care exam with abnormal findings    -  Primary   Relevant Orders   CBC   Comprehensive metabolic panel   Lipid panel   Cytology - PAP( [place])   Encounter for initial prescription of contraceptive pills       Relevant Medications   norethindrone-ethinyl estradiol (LOESTRIN) 1-20 MG-MCG tablet   Other Relevant Orders   POCT urine pregnancy (Completed)   Encounter for tobacco use cessation counseling       Relevant Medications   buPROPion (WELLBUTRIN SR) 150 MG 12 hr tablet   Encounter for lipid screening for cardiovascular disease       Relevant Orders   Lipid panel   BMI 40.0-44.9, adult (HCC)       Relevant Orders   Vitamin D 1,25 dihydroxy   TSH   Encounter for Papanicolaou smear for cervical cancer screening       Relevant Orders   Cytology - PAP( [place])   Urinary urgency       Relevant Orders   Urinalysis w microscopic + reflex cultur   Screening examination for STD (sexually transmitted disease)       Relevant Orders   Cervicovaginal ancillary only( [place])   HIV antibody (with reflex)   RPR      Follow up: Return in about 4 weeks (around 10/23/2019) for tobacco cessation (video).  Alysia Penna, NP

## 2019-09-26 LAB — CERVICOVAGINAL ANCILLARY ONLY
Chlamydia: NEGATIVE
Comment: NEGATIVE
Comment: NEGATIVE
Comment: NORMAL
Neisseria Gonorrhea: NEGATIVE
Trichomonas: NEGATIVE

## 2019-09-26 LAB — CYTOLOGY - PAP
Comment: NEGATIVE
Diagnosis: NEGATIVE
High risk HPV: NEGATIVE

## 2019-09-27 ENCOUNTER — Encounter: Payer: Self-pay | Admitting: Nurse Practitioner

## 2019-09-27 DIAGNOSIS — Z716 Tobacco abuse counseling: Secondary | ICD-10-CM | POA: Insufficient documentation

## 2019-09-27 DIAGNOSIS — R3915 Urgency of urination: Secondary | ICD-10-CM | POA: Insufficient documentation

## 2019-09-27 DIAGNOSIS — F17200 Nicotine dependence, unspecified, uncomplicated: Secondary | ICD-10-CM | POA: Insufficient documentation

## 2019-09-27 LAB — URINALYSIS W MICROSCOPIC + REFLEX CULTURE
Bacteria, UA: NONE SEEN /HPF
Bilirubin Urine: NEGATIVE
Glucose, UA: NEGATIVE
Hyaline Cast: NONE SEEN /LPF
Nitrites, Initial: NEGATIVE
Specific Gravity, Urine: 1.03 (ref 1.001–1.03)
WBC, UA: NONE SEEN /HPF (ref 0–5)
pH: 6.5 (ref 5.0–8.0)

## 2019-09-27 LAB — URINE CULTURE
MICRO NUMBER:: 10265860
SPECIMEN QUALITY:: ADEQUATE

## 2019-09-27 LAB — CULTURE INDICATED

## 2019-09-27 NOTE — Assessment & Plan Note (Signed)
We discussed the need for weight loss through lifestyle changes: diet and exercise; in order to avoid complications like dyslipidemia, HTN, DM and OA. She verbalized understanding

## 2019-09-27 NOTE — Assessment & Plan Note (Signed)
I spent discussing the adverse effects of continuous tobacco use in combination with combined hormonal contraception, her risk for COPD, cancer and PVD. We also discussed discussed different methods to quit. She verbalized understanding and choose to start zyban.  Start zyban 1week prior to quit date. F/up in 48month

## 2019-09-29 LAB — RPR: RPR Ser Ql: NONREACTIVE

## 2019-09-29 LAB — VITAMIN D 1,25 DIHYDROXY
Vitamin D 1, 25 (OH)2 Total: 39 pg/mL (ref 18–72)
Vitamin D2 1, 25 (OH)2: <8 pg/mL
Vitamin D3 1, 25 (OH)2: 39 pg/mL

## 2019-09-29 LAB — HIV ANTIBODY (ROUTINE TESTING W REFLEX): HIV 1&2 Ab, 4th Generation: NONREACTIVE

## 2020-02-07 ENCOUNTER — Encounter: Payer: Self-pay | Admitting: Nurse Practitioner

## 2020-02-07 ENCOUNTER — Other Ambulatory Visit: Payer: Self-pay

## 2020-02-07 ENCOUNTER — Ambulatory Visit (INDEPENDENT_AMBULATORY_CARE_PROVIDER_SITE_OTHER): Payer: No Typology Code available for payment source | Admitting: Nurse Practitioner

## 2020-02-07 DIAGNOSIS — Z716 Tobacco abuse counseling: Secondary | ICD-10-CM | POA: Diagnosis not present

## 2020-02-07 DIAGNOSIS — Z6841 Body Mass Index (BMI) 40.0 and over, adult: Secondary | ICD-10-CM | POA: Diagnosis not present

## 2020-02-07 MED ORDER — BUPROPION HCL ER (SR) 150 MG PO TB12: ORAL_TABLET | ORAL | 5 refills | Status: DC

## 2020-02-07 NOTE — Assessment & Plan Note (Signed)
Start wellbutrin, taper up slowly, take with food. F/up in 62month

## 2020-02-07 NOTE — Progress Notes (Signed)
Subjective:  Patient ID: Brenda Navarro, female    DOB: 03-03-1986  Age: 34 y.o. MRN: [id number]  CC: Weight Loss (concerns about weight gain. )  HPI  Morbid obesity (HCC) Lowest weight 130lbs at age 8. Weight gain with pregnancy and strained marriage. Admits to increased appetite with increase stress. No appetite suppressant used in past. Start lifestyle modification started 79months ago: decrease calorie intake to 1700, eliminated surgary drinks, exercise (counting steps-10000/day, riding her bicycle) 4x/week. No weight loss noted with above changes. Social ETOH intake, continuous tobacco use, did not start wellbutrin.  Entered referral to nutritionist and weight loss clinic. Continue daily exercise and decrease calorie intake. Start wellbutrin F/up in 68month  Encounter for smoking cessation counseling Start wellbutrin, taper up slowly, take with food. F/up in 31month  Wt Readings from Last 3 Encounters:  02/07/20 (!) 262 lb 6.4 oz (119 kg)  09/25/19 252 lb 3.2 oz (114.4 kg)  11/30/16 224 lb (101.6 kg)   Reviewed past Medical, Social and Family history today.  Outpatient Medications Prior to Visit  Medication Sig Dispense Refill  . buPROPion (WELLBUTRIN SR) 150 MG 12 hr tablet Take 1 tablet (150 mg total) by mouth daily for 7 days, THEN 1 tablet (150 mg total) 2 (two) times daily for 23 days. 53 tablet 5  . norethindrone-ethinyl estradiol (LOESTRIN) 1-20 MG-MCG tablet Take 1 tablet by mouth daily. (Patient not taking: Reported on 02/07/2020) 3 Package 3   No facility-administered medications prior to visit.    ROS See HPI  Objective:  BP 112/70   Pulse 103   Temp (!) 97.5 F (36.4 C) (Tympanic)   Ht 5\' 4"  (1.626 m)   Wt (!) 262 lb 6.4 oz (119 kg)   LMP 01/11/2020 (Exact Date)   SpO2 97%   Breastfeeding No   BMI 45.04 kg/m   Physical Exam Constitutional:      Appearance: She is obese.  Cardiovascular:     Rate and Rhythm: Normal rate.     Pulses: Normal  pulses.  Pulmonary:     Effort: Pulmonary effort is normal.  Neurological:     Mental Status: She is alert and oriented to person, place, and time.  Psychiatric:        Mood and Affect: Mood normal.        Behavior: Behavior normal.        Thought Content: Thought content normal.    Assessment & Plan:  This visit occurred during the SARS-CoV-2 public health emergency.  Safety protocols were in place, including screening questions prior to the visit, additional usage of staff PPE, and extensive cleaning of exam room while observing appropriate contact time as indicated for disinfecting solutions.   Brenda Navarro was seen today for weight loss.  Diagnoses and all orders for this visit:  Morbid obesity (HCC) -     Amb Ref to Medical Weight Management -     Amb ref to Medical Nutrition Therapy-[facility]  Tobacco abuse counseling -     Discontinue: buPROPion (WELLBUTRIN SR) 150 MG 12 hr tablet; 1tab with breakfast x 1week, then 1tab BID with food continuously  Encounter for smoking cessation counseling -     Discontinue: buPROPion (WELLBUTRIN SR) 150 MG 12 hr tablet; 1tab with breakfast x 1week, then 1tab BID with food continuously -     buPROPion (WELLBUTRIN SR) 150 MG 12 hr tablet; Take 1tab with breakfast x 1weeks, then 1tab BID continuously with food  Body mass index (BMI) of 45.0-49.9 in  adult (HCC) -     Amb ref to Medical Nutrition Therapy-[facility]  Other orders -     Discontinue: buPROPion (WELLBUTRIN SR) 150 MG 12 hr tablet; Take 1tab with breakfast x 1weeks, then 1tab BID continuously with food   Problem List Items Addressed This Visit      Other   Encounter for smoking cessation counseling    Start wellbutrin, taper up slowly, take with food. F/up in 53month      Relevant Medications   buPROPion (WELLBUTRIN SR) 150 MG 12 hr tablet   Morbid obesity (HCC) - Primary    Lowest weight 130lbs at age 42. Weight gain with pregnancy and strained marriage. Admits to increased appetite with  increase stress. No appetite suppressant used in past. Start lifestyle modification started 8months ago: decrease calorie intake to 1700, eliminated surgary drinks, exercise (counting steps-10000/day, riding her bicycle) 4x/week. No weight loss noted with above changes. Social ETOH intake, continuous tobacco use, did not start wellbutrin.  Entered referral to nutritionist and weight loss clinic. Continue daily exercise and decrease calorie intake. Start wellbutrin F/up in 3month      Relevant Orders   Amb Ref to Medical Weight Management   Amb ref to Medical Nutrition Therapy-[facility]    Other Visit Diagnoses    Tobacco abuse counseling       Body mass index (BMI) of 45.0-49.9 in adult [facility])       Relevant Orders   Amb ref to Medical Nutrition Therapy-[facility]      I have spent with this patient regarding history taking, documentation, review of previous lab results, social and family history, formulating plan and discussing treatment options with patient.  Follow-up: Return in 4 weeks (on 03/06/2020) for weight management and tobacco use.  Alysia Penna, NP

## 2020-02-07 NOTE — Patient Instructions (Addendum)
Resume wellbutrin You will be contacted to schedule appt with weight loss clinic.  Calorie Counting for Weight Loss Calories are units of energy. Your body needs a certain amount of calories from food to keep you going throughout the day. When you eat more calories than your body needs, your body stores the extra calories as fat. When you eat fewer calories than your body needs, your body burns fat to get the energy it needs. Calorie counting means keeping track of how many calories you eat and drink each day. Calorie counting can be helpful if you need to lose weight. If you make sure to eat fewer calories than your body needs, you should lose weight. Ask your health care provider what a healthy weight is for you. For calorie counting to work, you will need to eat the right number of calories in a day in order to lose a healthy amount of weight per week. A dietitian can help you determine how many calories you need in a day and will give you suggestions on how to reach your calorie goal.  A healthy amount of weight to lose per week is usually 1-2 lb (0.5-0.9 kg). This usually means that your daily calorie intake should be reduced by 500-750 calories.  Eating 1,200 - 1,500 calories per day can help most women lose weight.  Eating 1,500 - 1,800 calories per day can help most men lose weight. What is my plan? My goal is to have _1500_________ calories per day. If I have this many calories per day, I should lose around ______1____ pounds per week. What do I need to know about calorie counting? In order to meet your daily calorie goal, you will need to:  Find out how many calories are in each food you would like to eat. Try to do this before you eat.  Decide how much of the food you plan to eat.  Write down what you ate and how many calories it had. Doing this is called keeping a food log. To successfully lose weight, it is important to balance calorie counting with a healthy lifestyle that  includes regular activity. Aim for 150 minutes of moderate exercise (such as walking) or 75 minutes of vigorous exercise (such as running) each week. Where do I find calorie information?  The number of calories in a food can be found on a Nutrition Facts label. If a food does not have a Nutrition Facts label, try to look up the calories online or ask your dietitian for help. Remember that calories are listed per serving. If you choose to have more than one serving of a food, you will have to multiply the calories per serving by the amount of servings you plan to eat. For example, the label on a package of bread might say that a serving size is 1 slice and that there are 90 calories in a serving. If you eat 1 slice, you will have eaten 90 calories. If you eat 2 slices, you will have eaten 180 calories. How do I keep a food log? Immediately after each meal, record the following information in your food log:  What you ate. Don't forget to include toppings, sauces, and other extras on the food.  How much you ate. This can be measured in cups, ounces, or number of items.  How many calories each food and drink had.  The total number of calories in the meal. Keep your food log near you, such as in a small notebook  in your pocket, or use a mobile app or website. Some programs will calculate calories for you and show you how many calories you have left for the day to meet your goal. What are some calorie counting tips?   Use your calories on foods and drinks that will fill you up and not leave you hungry: ? Some examples of foods that fill you up are nuts and nut butters, vegetables, lean proteins, and high-fiber foods like whole grains. High-fiber foods are foods with more than 5 g fiber per serving. ? Drinks such as sodas, specialty coffee drinks, alcohol, and juices have a lot of calories, yet do not fill you up.  Eat nutritious foods and avoid empty calories. Empty calories are calories you get  from foods or beverages that do not have many vitamins or protein, such as candy, sweets, and soda. It is better to have a nutritious high-calorie food (such as an avocado) than a food with few nutrients (such as a bag of chips).  Know how many calories are in the foods you eat most often. This will help you calculate calorie counts faster.  Pay attention to calories in drinks. Low-calorie drinks include water and unsweetened drinks.  Pay attention to nutrition labels for "low fat" or "fat free" foods. These foods sometimes have the same amount of calories or more calories than the full fat versions. They also often have added sugar, starch, or salt, to make up for flavor that was removed with the fat.  Find a way of tracking calories that works for you. Get creative. Try different apps or programs if writing down calories does not work for you. What are some portion control tips?  Know how many calories are in a serving. This will help you know how many servings of a certain food you can have.  Use a measuring cup to measure serving sizes. You could also try weighing out portions on a kitchen scale. With time, you will be able to estimate serving sizes for some foods.  Take some time to put servings of different foods on your favorite plates, bowls, and cups so you know what a serving looks like.  Try not to eat straight from a bag or box. Doing this can lead to overeating. Put the amount you would like to eat in a cup or on a plate to make sure you are eating the right portion.  Use smaller plates, glasses, and bowls to prevent overeating.  Try not to multitask (for example, watch TV or use your computer) while eating. If it is time to eat, sit down at a table and enjoy your food. This will help you to know when you are full. It will also help you to be aware of what you are eating and how much you are eating. What are tips for following this plan? Reading food labels  Check the calorie  count compared to the serving size. The serving size may be smaller than what you are used to eating.  Check the source of the calories. Make sure the food you are eating is high in vitamins and protein and low in saturated and trans fats. Shopping  Read nutrition labels while you shop. This will help you make healthy decisions before you decide to purchase your food.  Make a grocery list and stick to it. Cooking  Try to cook your favorite foods in a healthier way. For example, try baking instead of frying.  Use low-fat dairy products. Meal  planning  Use more fruits and vegetables. Half of your plate should be fruits and vegetables.  Include lean proteins like poultry and fish. How do I count calories when eating out?  Ask for smaller portion sizes.  Consider sharing an entree and sides instead of getting your own entree.  If you get your own entree, eat only half. Ask for a box at the beginning of your meal and put the rest of your entree in it so you are not tempted to eat it.  If calories are listed on the menu, choose the lower calorie options.  Choose dishes that include vegetables, fruits, whole grains, low-fat dairy products, and lean protein.  Choose items that are boiled, broiled, grilled, or steamed. Stay away from items that are buttered, battered, fried, or served with cream sauce. Items labeled "crispy" are usually fried, unless stated otherwise.  Choose water, low-fat milk, unsweetened iced tea, or other drinks without added sugar. If you want an alcoholic beverage, choose a lower calorie option such as a glass of wine or light beer.  Ask for dressings, sauces, and syrups on the side. These are usually high in calories, so you should limit the amount you eat.  If you want a salad, choose a garden salad and ask for grilled meats. Avoid extra toppings like bacon, cheese, or fried items. Ask for the dressing on the side, or ask for olive oil and vinegar or lemon to use  as dressing.  Estimate how many servings of a food you are given. For example, a serving of cooked rice is  cup or about the size of half a baseball. Knowing serving sizes will help you be aware of how much food you are eating at restaurants. The list below tells you how big or small some common portion sizes are based on everyday objects: ? 1 oz--4 stacked dice. ? 3 oz--1 deck of cards. ? 1 tsp--1 die. ? 1 Tbsp-- a ping-pong ball. ? 2 Tbsp--1 ping-pong ball. ?  cup-- baseball. ? 1 cup--1 baseball. Summary  Calorie counting means keeping track of how many calories you eat and drink each day. If you eat fewer calories than your body needs, you should lose weight.  A healthy amount of weight to lose per week is usually 1-2 lb (0.5-0.9 kg). This usually means reducing your daily calorie intake by 500-750 calories.  The number of calories in a food can be found on a Nutrition Facts label. If a food does not have a Nutrition Facts label, try to look up the calories online or ask your dietitian for help.  Use your calories on foods and drinks that will fill you up, and not on foods and drinks that will leave you hungry.  Use smaller plates, glasses, and bowls to prevent overeating. This information is not intended to replace advice given to you by your health care provider. Make sure you discuss any questions you have with your health care provider. Document Revised: 03/16/2018 Document Reviewed: 05/27/2016 Elsevier Patient Education  2020 [employer].   Exercising to Lose Weight Exercise is structured, repetitive physical activity to improve fitness and health. Getting regular exercise is important for everyone. It is especially important if you are overweight. Being overweight increases your risk of heart disease, stroke, diabetes, high blood pressure, and several types of cancer. Reducing your calorie intake and exercising can help you lose weight. Exercise is usually categorized as  moderate or vigorous intensity. To lose weight, most people need to  do a certain amount of moderate-intensity or vigorous-intensity exercise each week. Moderate-intensity exercise  Moderate-intensity exercise is any activity that gets you moving enough to burn at least three times more energy (calories) than if you were sitting. Examples of moderate exercise include:  Walking a mile in 15 minutes.  Doing light yard work.  Biking at an easy pace. Most people should get at least 150 minutes (2 hours and 30 minutes) a week of moderate-intensity exercise to maintain their body weight. Vigorous-intensity exercise Vigorous-intensity exercise is any activity that gets you moving enough to burn at least six times more calories than if you were sitting. When you exercise at this intensity, you should be working hard enough that you are not able to carry on a conversation. Examples of vigorous exercise include:  Running.  Playing a team sport, such as football, basketball, and soccer.  Jumping rope. Most people should get at least 75 minutes (1 hour and 15 minutes) a week of vigorous-intensity exercise to maintain their body weight. How can exercise affect me? When you exercise enough to burn more calories than you eat, you lose weight. Exercise also reduces body fat and builds muscle. The more muscle you have, the more calories you burn. Exercise also:  Improves mood.  Reduces stress and tension.  Improves your overall fitness, flexibility, and endurance.  Increases bone strength. The amount of exercise you need to lose weight depends on:  Your age.  The type of exercise.  Any health conditions you have.  Your overall physical ability. Talk to your health care provider about how much exercise you need and what types of activities are safe for you. What actions can I take to lose weight? Nutrition   Make changes to your diet as told by your health care provider or diet and  nutrition specialist (dietitian). This may include: ? Eating fewer calories. ? Eating more protein. ? Eating less unhealthy fats. ? Eating a diet that includes fresh fruits and vegetables, whole grains, low-fat dairy products, and lean protein. ? Avoiding foods with added fat, salt, and sugar.  Drink plenty of water while you exercise to prevent dehydration or heat stroke. Activity  Choose an activity that you enjoy and set realistic goals. Your health care provider can help you make an exercise plan that works for you.  Exercise at a moderate or vigorous intensity most days of the week. ? The intensity of exercise may vary from person to person. You can tell how intense a workout is for you by paying attention to your breathing and heartbeat. Most people will notice their breathing and heartbeat get faster with more intense exercise.  Do resistance training twice each week, such as: ? Push-ups. ? Sit-ups. ? Lifting weights. ? Using resistance bands.  Getting short amounts of exercise can be just as helpful as long structured periods of exercise. If you have trouble finding time to exercise, try to include exercise in your daily routine. ? Get up, stretch, and walk around every 30 minutes throughout the day. ? Go for a walk during your lunch break. ? Park your car farther away from your destination. ? If you take public transportation, get off one stop early and walk the rest of the way. ? Make phone calls while standing up and walking around. ? Take the stairs instead of elevators or escalators.  Wear comfortable clothes and shoes with good support.  Do not exercise so much that you hurt yourself, feel dizzy, or get very  short of breath. Where to find more information  [place] Department of Health and Human Services: ThisPath.fiwww.hhs.gov  Centers for Disease Control and Prevention (CDC): FootballExhibition.com.brwww.cdc.gov Contact a health care provider:  Before starting a new exercise program.  If you have  questions or concerns about your weight.  If you have a medical problem that keeps you from exercising. Get help right away if you have any of the following while exercising:  Injury.  Dizziness.  Difficulty breathing or shortness of breath that does not go away when you stop exercising.  Chest pain.  Rapid heartbeat. Summary  Being overweight increases your risk of heart disease, stroke, diabetes, high blood pressure, and several types of cancer.  Losing weight happens when you burn more calories than you eat.  Reducing the amount of calories you eat in addition to getting regular moderate or vigorous exercise each week helps you lose weight. This information is not intended to replace advice given to you by your health care provider. Make sure you discuss any questions you have with your health care provider. Document Revised: 07/10/2017 Document Reviewed: 07/10/2017 Elsevier Patient Education  2020 [employer]    Calorie Counting for [employer] Calories are units of energy. Your body needs a certain amount of calories from food to keep you going throughout the day. When you eat more calories than your body needs, your body stores the extra calories as fat. When you eat fewer calories than your body needs, your body burns fat to get the energy it needs. Calorie counting means keeping track of how many calories you eat and drink each day. Calorie counting can be helpful if you need to lose weight. If you make sure to eat fewer calories than your body needs, you should lose weight. Ask your health care provider what a healthy weight is for you. For calorie counting to work, you will need to eat the right number of calories in a day in order to lose a healthy amount of weight per week. A dietitian can help you determine how many calories you need in a day and will give you suggestions on how to reach your calorie goal.  A healthy amount of weight to lose per week is usually 1-2 lb  (0.5-0.9 kg). This usually means that your daily calorie intake should be reduced by 500-750 calories.  Eating 1,200 - 1,500 calories per day can help most women lose weight.  Eating 1,500 - 1,800 calories per day can help most men lose weight. What is my plan? My goal is to have __________ calories per day. If I have this many calories per day, I should lose around __________ pounds per week. What do I need to know about calorie counting? In order to meet your daily calorie goal, you will need to:  Find out how many calories are in each food you would like to eat. Try to do this before you eat.  Decide how much of the food you plan to eat.  Write down what you ate and how many calories it had. Doing this is called keeping a food log. To successfully lose weight, it is important to balance calorie counting with a healthy lifestyle that includes regular activity. Aim for 150 minutes of moderate exercise (such as walking) or 75 minutes of vigorous exercise (such as running) each week. Where do I find calorie information?  The number of calories in a food can be found on a Nutrition Facts label. If a food does  not have a Nutrition Facts label, try to look up the calories online or ask your dietitian for help. Remember that calories are listed per serving. If you choose to have more than one serving of a food, you will have to multiply the calories per serving by the amount of servings you plan to eat. For example, the label on a package of bread might say that a serving size is 1 slice and that there are 90 calories in a serving. If you eat 1 slice, you will have eaten 90 calories. If you eat 2 slices, you will have eaten 180 calories. How do I keep a food log? Immediately after each meal, record the following information in your food log:  What you ate. Don't forget to include toppings, sauces, and other extras on the food.  How much you ate. This can be measured in cups, ounces, or number of  items.  How many calories each food and drink had.  The total number of calories in the meal. Keep your food log near you, such as in a small notebook in your pocket, or use a mobile app or website. Some programs will calculate calories for you and show you how many calories you have left for the day to meet your goal. What are some calorie counting tips?   Use your calories on foods and drinks that will fill you up and not leave you hungry: ? Some examples of foods that fill you up are nuts and nut butters, vegetables, lean proteins, and high-fiber foods like whole grains. High-fiber foods are foods with more than 5 g fiber per serving. ? Drinks such as sodas, specialty coffee drinks, alcohol, and juices have a lot of calories, yet do not fill you up.  Eat nutritious foods and avoid empty calories. Empty calories are calories you get from foods or beverages that do not have many vitamins or protein, such as candy, sweets, and soda. It is better to have a nutritious high-calorie food (such as an avocado) than a food with few nutrients (such as a bag of chips).  Know how many calories are in the foods you eat most often. This will help you calculate calorie counts faster.  Pay attention to calories in drinks. Low-calorie drinks include water and unsweetened drinks.  Pay attention to nutrition labels for "low fat" or "fat free" foods. These foods sometimes have the same amount of calories or more calories than the full fat versions. They also often have added sugar, starch, or salt, to make up for flavor that was removed with the fat.  Find a way of tracking calories that works for you. Get creative. Try different apps or programs if writing down calories does not work for you. What are some portion control tips?  Know how many calories are in a serving. This will help you know how many servings of a certain food you can have.  Use a measuring cup to measure serving sizes. You could also try  weighing out portions on a kitchen scale. With time, you will be able to estimate serving sizes for some foods.  Take some time to put servings of different foods on your favorite plates, bowls, and cups so you know what a serving looks like.  Try not to eat straight from a bag or box. Doing this can lead to overeating. Put the amount you would like to eat in a cup or on a plate to make sure you are eating the right  portion.  Use smaller plates, glasses, and bowls to prevent overeating.  Try not to multitask (for example, watch TV or use your computer) while eating. If it is time to eat, sit down at a table and enjoy your food. This will help you to know when you are full. It will also help you to be aware of what you are eating and how much you are eating. What are tips for following this plan? Reading food labels  Check the calorie count compared to the serving size. The serving size may be smaller than what you are used to eating.  Check the source of the calories. Make sure the food you are eating is high in vitamins and protein and low in saturated and trans fats. Shopping  Read nutrition labels while you shop. This will help you make healthy decisions before you decide to purchase your food.  Make a grocery list and stick to it. Cooking  Try to cook your favorite foods in a healthier way. For example, try baking instead of frying.  Use low-fat dairy products. Meal planning  Use more fruits and vegetables. Half of your plate should be fruits and vegetables.  Include lean proteins like poultry and fish. How do I count calories when eating out?  Ask for smaller portion sizes.  Consider sharing an entree and sides instead of getting your own entree.  If you get your own entree, eat only half. Ask for a box at the beginning of your meal and put the rest of your entree in it so you are not tempted to eat it.  If calories are listed on the menu, choose the lower calorie  options.  Choose dishes that include vegetables, fruits, whole grains, low-fat dairy products, and lean protein.  Choose items that are boiled, broiled, grilled, or steamed. Stay away from items that are buttered, battered, fried, or served with cream sauce. Items labeled "crispy" are usually fried, unless stated otherwise.  Choose water, low-fat milk, unsweetened iced tea, or other drinks without added sugar. If you want an alcoholic beverage, choose a lower calorie option such as a glass of wine or light beer.  Ask for dressings, sauces, and syrups on the side. These are usually high in calories, so you should limit the amount you eat.  If you want a salad, choose a garden salad and ask for grilled meats. Avoid extra toppings like bacon, cheese, or fried items. Ask for the dressing on the side, or ask for olive oil and vinegar or lemon to use as dressing.  Estimate how many servings of a food you are given. For example, a serving of cooked rice is  cup or about the size of half a baseball. Knowing serving sizes will help you be aware of how much food you are eating at restaurants. The list below tells you how big or small some common portion sizes are based on everyday objects: ? 1 oz--4 stacked dice. ? 3 oz--1 deck of cards. ? 1 tsp--1 die. ? 1 Tbsp-- a ping-pong ball. ? 2 Tbsp--1 ping-pong ball. ?  cup-- baseball. ? 1 cup--1 baseball. Summary  Calorie counting means keeping track of how many calories you eat and drink each day. If you eat fewer calories than your body needs, you should lose weight.  A healthy amount of weight to lose per week is usually 1-2 lb (0.5-0.9 kg). This usually means reducing your daily calorie intake by 500-750 calories.  The number of calories in a  food can be found on a Nutrition Facts label. If a food does not have a Nutrition Facts label, try to look up the calories online or ask your dietitian for help.  Use your calories on foods and drinks that  will fill you up, and not on foods and drinks that will leave you hungry.  Use smaller plates, glasses, and bowls to prevent overeating. This information is not intended to replace advice given to you by your health care provider. Make sure you discuss any questions you have with your health care provider. Document Revised: 03/16/2018 Document Reviewed: 05/27/2016 Elsevier Patient Education  2020 [employer].

## 2020-02-07 NOTE — Assessment & Plan Note (Signed)
Lowest weight 130lbs at age 34. Weight gain with pregnancy and strained marriage. Admits to increased appetite with increase stress. No appetite suppressant used in past. Start lifestyle modification started 60months ago: decrease calorie intake to 1700, eliminated surgary drinks, exercise (counting steps-10000/day, riding her bicycle) 4x/week. No weight loss noted with above changes. Social ETOH intake, continuous tobacco use, did not start wellbutrin.  Entered referral to nutritionist and weight loss clinic. Continue daily exercise and decrease calorie intake. Start wellbutrin F/up in 96month

## 2020-02-10 MED FILL — LARIN 21 1-20 TABLET: 1-20 | 63 days supply | Qty: 63 | Fill #0

## 2020-02-10 MED FILL — BUPROPION HCL SR 150 MG TAB: 150 | 30 days supply | Qty: 60 | Fill #0

## 2020-02-12 ENCOUNTER — Encounter: Payer: Self-pay | Admitting: Nurse Practitioner

## 2020-02-12 DIAGNOSIS — M25559 Pain in unspecified hip: Secondary | ICD-10-CM

## 2020-02-19 ENCOUNTER — Ambulatory Visit (INDEPENDENT_AMBULATORY_CARE_PROVIDER_SITE_OTHER): Payer: No Typology Code available for payment source | Admitting: Family Medicine

## 2020-02-19 DIAGNOSIS — Z5329 Procedure and treatment not carried out because of patient's decision for other reasons: Secondary | ICD-10-CM

## 2020-02-19 NOTE — Progress Notes (Signed)
No show

## 2020-02-20 ENCOUNTER — Other Ambulatory Visit: Payer: Self-pay

## 2020-02-20 ENCOUNTER — Ambulatory Visit (INDEPENDENT_AMBULATORY_CARE_PROVIDER_SITE_OTHER): Payer: No Typology Code available for payment source | Admitting: Family Medicine

## 2020-02-20 ENCOUNTER — Encounter (INDEPENDENT_AMBULATORY_CARE_PROVIDER_SITE_OTHER): Payer: Self-pay | Admitting: Family Medicine

## 2020-02-20 VITALS — BP 115/74 | HR 71 | Temp 98.2°F | Ht 64.0 in | Wt 256.0 lb

## 2020-02-20 DIAGNOSIS — M2559 Pain in other specified joint: Secondary | ICD-10-CM | POA: Diagnosis not present

## 2020-02-20 DIAGNOSIS — R5383 Other fatigue: Secondary | ICD-10-CM | POA: Diagnosis not present

## 2020-02-20 DIAGNOSIS — R7301 Impaired fasting glucose: Secondary | ICD-10-CM

## 2020-02-20 DIAGNOSIS — Z0289 Encounter for other administrative examinations: Secondary | ICD-10-CM

## 2020-02-20 DIAGNOSIS — Z9189 Other specified personal risk factors, not elsewhere classified: Secondary | ICD-10-CM

## 2020-02-20 DIAGNOSIS — R5381 Other malaise: Secondary | ICD-10-CM | POA: Diagnosis not present

## 2020-02-20 DIAGNOSIS — F17200 Nicotine dependence, unspecified, uncomplicated: Secondary | ICD-10-CM

## 2020-02-20 DIAGNOSIS — E559 Vitamin D deficiency, unspecified: Secondary | ICD-10-CM

## 2020-02-20 DIAGNOSIS — F3289 Other specified depressive episodes: Secondary | ICD-10-CM | POA: Diagnosis not present

## 2020-02-20 DIAGNOSIS — Z6841 Body Mass Index (BMI) 40.0 and over, adult: Secondary | ICD-10-CM

## 2020-02-20 DIAGNOSIS — R0602 Shortness of breath: Secondary | ICD-10-CM | POA: Diagnosis not present

## 2020-02-20 NOTE — Progress Notes (Signed)
Dear Dr. Elease Etienne,   Thank you for referring Brenda Navarro to our clinic. The following note includes my evaluation and treatment recommendations.  Chief Complaint:   OBESITY Brenda Navarro (MR# [id number]) is a 34 y.o. female who presents for evaluation and treatment of obesity and related comorbidities. Current BMI is Body mass index is 43.94 kg/m. Brenda Navarro has been struggling with her weight for many years and has been unsuccessful in either losing weight, maintaining weight loss, or reaching her healthy weight goal.  Brenda Navarro is currently in the action stage of change and ready to dedicate time achieving and maintaining a healthier weight. Brenda Navarro is interested in becoming our patient and working on intensive lifestyle modifications including (but not limited to) diet and exercise for weight loss.  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  She lives with her 3 children, ages 19, 6, and 6.  She wants to lose over 125 pounds in 1 year.  She did low carb and 1500 calorie diet in the past, which worked best for her in the past.  She is not sure how much she lost or for how long.  She craves seafood with butter, sweet tea, candy, and popcorn.  She skips breakfast 3-4 days per week.  She rarely wakes up at 3 am and eats.  She drinks sweet tea, juice, and sugared/flavored coffees.  Her worst habit, she says, is eating at night.  [name]'s habits were reviewed today and are as follows: Her family eats meals together, she thinks her family will eat healthier with her, her desired weight loss is 118 pounds, she started gaining weight after her second child was born, her heaviest weight ever was 263 pounds, she craves seafood with butter, sweet tea, she snacks frequently in the evenings, she wakes up frequently in the middle of the night to eat, she skips breakfast frequently, she is frequently drinking liquids with calories, she frequently makes poor food choices and she has  binge eating behaviors.  Depression Screen Brenda Navarro's Food and Mood (modified PHQ-9) score was 20.  Depression screen PHQ 2/9 02/20/2020  Decreased Interest 3  Down, Depressed, Hopeless 2  PHQ - 2 Score 5  Altered sleeping 2  Tired, decreased energy 3  Change in appetite 2  Feeling bad or failure about yourself  3  Trouble concentrating 1  Moving slowly or fidgety/restless 3  Suicidal thoughts 1  PHQ-9 Score 20  Difficult doing work/chores Very difficult   Subjective:   1. Other fatigue [name] denies daytime somnolence and admits to waking up still tired. Patent has a history of symptoms of Brenda fatigue. [name] generally gets 4 or 5 hours of sleep per night, and states that she has poor quality sleep. Snoring is not present. Apneic episodes are not present. Epworth Sleepiness Score is 10.  2. SOB (shortness of breath) on exertion Brenda Navarro notes increasing shortness of breath with exercising and seems to be worsening over time with weight gain. She notes getting out of breath sooner with activity than she used to. This has gotten worse recently. Brenda Navarro denies shortness of breath at rest or orthopnea.  3. Physical deconditioning With dyspnea on exertion.  She walks 4 days per week for 30-45 minutes.  She says gets 10,000 steps per day at work.  4. Pain in other joint Pain especially in hips and knees, and per Hendy, these pains got worse as her weight increased.  5.  Elevated fasting blood sugar Brenda Navarro has a history of some elevated blood glucose readings without a diagnosis of diabetes. She denies polyphagia.  6. Current smoker [name] currently smokes 1/2 pack per day.  She has been smoking for 7 years now.  She is now cutting back even more since starting Wellbutrin.   7. Vitamin D deficiency [name]'s Vitamin D level was 39.0 on 09/25/2019. She is currently taking no vitamin D supplement. She denies nausea, vomiting or muscle weakness.  8. Other depression with emotional  eating She has been taking Wellbutrin since March for smoking cessation via her PCP.  Tolerating well without side effects.  Mood is controlled, per patient.  Declines Dr. Dewaine Conger referral.  9. At risk for diabetes mellitus Brenda Navarro is at higher than average risk for developing diabetes due to her obesity.   Assessment/Plan:   1. Other fatigue Brenda Navarro does feel that her weight is causing her energy to be lower than it should be. Fatigue may be related to obesity, depression or many other causes. Labs will be ordered, and in the meanwhile, Brenda Navarro will focus on self care including making healthy food choices, increasing physical activity and focusing on stress reduction. - EKG 12-Lead - Vitamin B12 - CBC with Differential/Platelet - Lipid panel - T3 - T4 - TSH  2. SOB (shortness of breath) on exertion Brenda Navarro does feel that she gets out of breath more easily that she used to when she exercises. Brenda Navarro's shortness of breath appears to be obesity related and exercise induced. She has agreed to work on weight loss and gradually increase exercise to treat her exercise induced shortness of breath. Will continue to monitor closely. - Comprehensive metabolic panel - Folate  3. Physical deconditioning Check labs to rule out organic causes.  Prudent nutritional plan and weight loss.  4. Pain in other joint Prudent nutritional plan, weight loss, activity as is for now.  5. Elevated fasting blood sugar Fasting labs will be obtained and results with be discussed with Brenda Navarro in 2 weeks at her follow up visit. In the meanwhile Brenda Navarro was started on a lower simple carbohydrate diet and will work on weight loss efforts. - Hemoglobin A1c - Insulin, random  6. Current smoker Continue to wean off tobacco.  7. Vitamin D deficiency Low Vitamin D level contributes to fatigue and are associated with obesity, breast, and colon cancer.   Will check vitamin D level.  She says she has never been told she was  deficient in the past. - VITAMIN D 25 Hydroxy (Vit-D Deficiency, Fractures)  8. Other depression with emotional eating Check labs, continue medication, may increase dose if needed.    9. At risk for diabetes mellitus Mckenzye was given approximately 15 minutes of diabetes education and counseling today. We discussed intensive lifestyle modifications today with an emphasis on weight loss as well as increasing exercise and decreasing simple carbohydrates in her diet. We also reviewed medication options with an emphasis on risk versus benefit of those discussed.   Repetitive spaced learning was employed today to elicit superior memory formation and behavioral change.  10. Class 3 severe obesity with serious comorbidity and body mass index (BMI) of 40.0 to 44.9 in adult, unspecified obesity type (HCC) Cheryll is currently in the action stage of change and her goal is to continue with weight loss efforts. I recommend Radie begin the structured treatment plan as follows:  She has agreed to the Category 2 Plan.  Exercise goals: As is.   Behavioral modification  strategies: increasing lean protein intake, decreasing simple carbohydrates, decreasing liquid calories, meal planning and cooking strategies and planning for success.  She was informed of the importance of frequent follow-up visits to maximize her success with intensive lifestyle modifications for her multiple health conditions. She was informed we would discuss her lab results at her next visit unless there is a critical issue that needs to be addressed sooner. Briselda agreed to keep her next visit at the agreed upon time to discuss these results.  Objective:   Blood pressure 115/74, pulse 71, temperature 98.2 F (36.8 C), height 5\' 4"  (1.626 m), weight 256 lb (116.1 kg), last menstrual period 01/15/2020, SpO2 100 %. Body mass index is 43.94 kg/m.  EKG: Normal sinus rhythm, rate 79 bpm.  Indirect Calorimeter completed today shows a VO2 of  302 and a REE of 2102.  Her calculated basal metabolic rate is 2103 thus her basal metabolic rate is better than expected.  General: Cooperative, alert, well developed, in no acute distress. HEENT: Conjunctivae and lids unremarkable. Cardiovascular: Regular rhythm.  Lungs: Normal work of breathing. Neurologic: No focal deficits.   Lab Results  Component Value Date   CREATININE 0.71 02/20/2020   BUN 9 02/20/2020   NA 141 02/20/2020   K 4.4 02/20/2020   CL 105 02/20/2020   CO2 23 02/20/2020   Lab Results  Component Value Date   ALT 9 02/20/2020   AST 11 02/20/2020   ALKPHOS 93 02/20/2020   BILITOT 0.4 02/20/2020   Lab Results  Component Value Date   TSH 1.060 02/20/2020   Lab Results  Component Value Date   CHOL 151 02/20/2020   HDL 43 02/20/2020   LDLCALC 90 02/20/2020   TRIG 98 02/20/2020   CHOLHDL 3.5 02/20/2020   Lab Results  Component Value Date   WBC 7.7 02/20/2020   HGB 12.9 02/20/2020   HCT 40.5 02/20/2020   MCV 85 02/20/2020   PLT 319 02/20/2020   Attestation Statements:   Reviewed by clinician on day of visit: allergies, medications, problem list, medical history, surgical history, family history, social history, and previous encounter notes.  I, 04/21/2020, CMA, am acting as Insurance claims handler for Energy manager, DO.  I have reviewed the above documentation for accuracy and completeness, and I agree with the above. [employer], DO

## 2020-02-21 LAB — LIPID PANEL
Chol/HDL Ratio: 3.5 ratio (ref 0.0–4.4)
Cholesterol, Total: 151 mg/dL (ref 100–199)
HDL: 43 mg/dL (ref >39)
LDL Chol Calc (NIH): 90 mg/dL (ref 0–99)
Triglycerides: 98 mg/dL (ref 0–149)
VLDL Cholesterol Cal: 18 mg/dL (ref 5–40)

## 2020-02-21 LAB — COMPREHENSIVE METABOLIC PANEL
ALT: 9 IU/L (ref 0–32)
AST: 11 IU/L (ref 0–40)
Albumin/Globulin Ratio: 1.8 (ref 1.2–2.2)
Albumin: 4.2 g/dL (ref 3.8–4.8)
Alkaline Phosphatase: 93 IU/L (ref 48–121)
BUN/Creatinine Ratio: 13 (ref 9–23)
BUN: 9 mg/dL (ref 6–20)
Bilirubin Total: 0.4 mg/dL (ref 0.0–1.2)
CO2: 23 mmol/L (ref 20–29)
Calcium: 9.3 mg/dL (ref 8.7–10.2)
Chloride: 105 mmol/L (ref 96–106)
Creatinine, Ser: 0.71 mg/dL (ref 0.57–1.00)
GFR calc Af Amer: 130 mL/min/{1.73_m2} (ref >59)
GFR calc non Af Amer: 113 mL/min/{1.73_m2} (ref >59)
Globulin, Total: 2.4 g/dL (ref 1.5–4.5)
Glucose: 80 mg/dL (ref 65–99)
Potassium: 4.4 mmol/L (ref 3.5–5.2)
Sodium: 141 mmol/L (ref 134–144)
Total Protein: 6.6 g/dL (ref 6.0–8.5)

## 2020-02-21 LAB — CBC WITH DIFFERENTIAL/PLATELET
Basophils Absolute: 0 10*3/uL (ref 0.0–0.2)
Basos: 0 %
EOS (ABSOLUTE): 0.1 10*3/uL (ref 0.0–0.4)
Eos: 1 %
Hematocrit: 40.5 % (ref 34.0–46.6)
Hemoglobin: 12.9 g/dL (ref 11.1–15.9)
Immature Grans (Abs): 0 10*3/uL (ref 0.0–0.1)
Immature Granulocytes: 0 %
Lymphocytes Absolute: 2.8 10*3/uL (ref 0.7–3.1)
Lymphs: 37 %
MCH: 27 pg (ref 26.6–33.0)
MCHC: 31.9 g/dL (ref 31.5–35.7)
MCV: 85 fL (ref 79–97)
Monocytes Absolute: 0.4 10*3/uL (ref 0.1–0.9)
Monocytes: 5 %
Neutrophils Absolute: 4.3 10*3/uL (ref 1.4–7.0)
Neutrophils: 57 %
Platelets: 319 10*3/uL (ref 150–450)
RBC: 4.77 x10E6/uL (ref 3.77–5.28)
RDW: 13.7 % (ref 11.7–15.4)
WBC: 7.7 10*3/uL (ref 3.4–10.8)

## 2020-02-21 LAB — T3: T3, Total: 143 ng/dL (ref 71–180)

## 2020-02-21 LAB — VITAMIN D 25 HYDROXY (VIT D DEFICIENCY, FRACTURES): Vit D, 25-Hydroxy: 16.5 ng/mL — ABNORMAL LOW (ref 30.0–100.0)

## 2020-02-21 LAB — T4: T4, Total: 7.1 ug/dL (ref 4.5–12.0)

## 2020-02-21 LAB — INSULIN, RANDOM: INSULIN: 32.1 u[IU]/mL — ABNORMAL HIGH (ref 2.6–24.9)

## 2020-02-21 LAB — VITAMIN B12: Vitamin B-12: 166 pg/mL — ABNORMAL LOW (ref 232–1245)

## 2020-02-21 LAB — FOLATE: Folate: 9.8 ng/mL (ref >3.0)

## 2020-02-21 LAB — HEMOGLOBIN A1C
Est. average glucose Bld gHb Est-mCnc: 117 mg/dL
Hgb A1c MFr Bld: 5.7 % — ABNORMAL HIGH (ref 4.8–5.6)

## 2020-02-21 LAB — TSH: TSH: 1.06 u[IU]/mL (ref 0.450–4.500)

## 2020-03-05 ENCOUNTER — Other Ambulatory Visit: Payer: Self-pay

## 2020-03-05 ENCOUNTER — Encounter (INDEPENDENT_AMBULATORY_CARE_PROVIDER_SITE_OTHER): Payer: Self-pay | Admitting: Family Medicine

## 2020-03-05 ENCOUNTER — Ambulatory Visit (INDEPENDENT_AMBULATORY_CARE_PROVIDER_SITE_OTHER): Payer: No Typology Code available for payment source | Admitting: Family Medicine

## 2020-03-05 VITALS — BP 122/82 | HR 75 | Temp 98.3°F | Ht 64.0 in | Wt 248.0 lb

## 2020-03-05 DIAGNOSIS — Z9189 Other specified personal risk factors, not elsewhere classified: Secondary | ICD-10-CM | POA: Insufficient documentation

## 2020-03-05 DIAGNOSIS — Z6841 Body Mass Index (BMI) 40.0 and over, adult: Secondary | ICD-10-CM

## 2020-03-05 DIAGNOSIS — R5383 Other fatigue: Secondary | ICD-10-CM | POA: Diagnosis not present

## 2020-03-05 DIAGNOSIS — E538 Deficiency of other specified B group vitamins: Secondary | ICD-10-CM | POA: Insufficient documentation

## 2020-03-05 DIAGNOSIS — E559 Vitamin D deficiency, unspecified: Secondary | ICD-10-CM | POA: Insufficient documentation

## 2020-03-05 DIAGNOSIS — R7303 Prediabetes: Secondary | ICD-10-CM

## 2020-03-05 DIAGNOSIS — R531 Weakness: Secondary | ICD-10-CM | POA: Insufficient documentation

## 2020-03-05 HISTORY — DX: Other fatigue: R53.83

## 2020-03-05 MED ORDER — CYANOCOBALAMIN 500 MCG PO TABS: 500.0000 ug | ORAL_TABLET | Freq: Every day | ORAL | 0 refills | Status: DC

## 2020-03-06 ENCOUNTER — Encounter: Payer: Self-pay | Admitting: Nurse Practitioner

## 2020-03-06 ENCOUNTER — Telehealth (INDEPENDENT_AMBULATORY_CARE_PROVIDER_SITE_OTHER): Payer: No Typology Code available for payment source | Admitting: Nurse Practitioner

## 2020-03-06 VITALS — Ht 64.0 in | Wt 248.0 lb

## 2020-03-06 DIAGNOSIS — F17209 Nicotine dependence, unspecified, with unspecified nicotine-induced disorders: Secondary | ICD-10-CM

## 2020-03-06 DIAGNOSIS — Z716 Tobacco abuse counseling: Secondary | ICD-10-CM

## 2020-03-06 MED ORDER — BUPROPION HCL ER (SR) 150 MG PO TB12: 150.0000 mg | ORAL_TABLET | Freq: Two times a day (BID) | ORAL | 1 refills | Status: DC

## 2020-03-06 MED FILL — BUPROPION HCL SR 150 MG TAB: 150 | 90 days supply | Qty: 180 | Fill #0

## 2020-03-06 NOTE — Assessment & Plan Note (Addendum)
Decreased from 1/2ppd to 4 cig/day with wellbutrin 150mg  BID in last 50month. Denies any adverse side effects. Use of sugarfree peppermint gum does not help with cravings.  Continue wellbutrin Advised to use nicotine gum/lozenges prn to help with cravings. F/up in 66months

## 2020-03-06 NOTE — Progress Notes (Signed)
Virtual Visit via Video Note  I connected with@ on 03/06/20 at 11:30 AM EDT by a video enabled telemedicine application and verified that I am speaking with the correct person using two identifiers.  Location: Patient:Home Provider: Office Participants: patient and provider  I discussed the limitations of evaluation and management by telemedicine and the availability of in person appointments. I also discussed with the patient that there may be a patient responsible charge related to this service. The patient expressed understanding and agreed to proceed.  ZO:[id number] use  History of Present Illness: Tobacco use disorder Decreased from 1/2ppd to 4 cig/day with wellbutrin 150mg  BID in last 19month. Denies any adverse side effects. Use of sugarfree peppermint gum does not help with cravings.  Continue wellbutrin Advised to use nicotine gum/lozenges prn to help with cravings. F/up in 22months  BP Readings from Last 3 Encounters:  03/05/20 122/82  02/20/20 115/74  02/07/20 112/70   Wt Readings from Last 3 Encounters:  03/06/20 248 lb (112.5 kg)  03/05/20 248 lb (112.5 kg)  02/20/20 256 lb (116.1 kg)   Observations/Objective: Physical Exam Vitals reviewed.  Constitutional:      Appearance: She is obese.  Neurological:     Mental Status: She is alert and oriented to person, place, and time.  Psychiatric:        Attention and Perception: Attention normal.        Mood and Affect: Mood normal.        Speech: Speech normal.        Behavior: Behavior normal.        Thought Content: Thought content normal.        Judgment: Judgment normal.    Assessment and Plan: Aftyn was seen today for follow-up.  Diagnoses and all orders for this visit:  Tobacco use disorder, continuous -     buPROPion (WELLBUTRIN SR) 150 MG 12 hr tablet; Take 1 tablet (150 mg total) by mouth 2 (two) times daily. With food  Encounter for smoking cessation counseling   Follow Up  Instructions: Continue wellbutrin Use nicotine lozenges/gum to help with cravings   I discussed the assessment and treatment plan with the patient. The patient was provided an opportunity to ask questions and all were answered. The patient agreed with the plan and demonstrated an understanding of the instructions.   The patient was advised to call back or seek an in-person evaluation if the symptoms worsen or if the condition fails to improve as anticipated.  Judithann Sauger, NP

## 2020-03-09 MED ORDER — VITAMIN D (ERGOCALCIFEROL) 1.25 MG (50000 UNIT) PO CAPS: 50000.0000 [IU] | ORAL_CAPSULE | ORAL | 0 refills | Status: DC

## 2020-03-09 MED FILL — VIT D2 1.25 MG (50,000 UNIT: 1.25 MG | 28 days supply | Qty: 4 | Fill #0

## 2020-03-09 NOTE — Progress Notes (Signed)
Chief Complaint:   OBESITY Brenda Navarro is here to discuss her progress with her obesity treatment plan along with follow-up of her obesity related diagnoses. Brenda Navarro is on the Category 2 Plan and states she is following her eating plan approximately 99% of the time. Brenda Navarro states she is getting 10,000 steps at work or more 3 times per week.  Today's visit was #: 2 Starting weight: 256 lbs Starting date: 02/20/2020 Today's weight: 248 lbs Today's date: 03/05/2020 Total lbs lost to date: 8 lbs Total lbs lost since last in-office visit: 8 lbs  Interim History: Brenda Navarro says she ate all her foods, and weighed them as well.  "I don't eat meat, only seafood."  She will eat steak and some chicken.  She likes the plan overall, just not a lot of protein.  She is drinking 64 ounces of water per day.  Subjective:   1. Other fatigue Brenda Navarro endorses more fatigue than usual.  2. B12 deficiency She notes fatigue. She is not a vegetarian.  She does not have a previous diagnosis of pernicious anemia.  She does not have a history of weight loss surgery.   Lab Results  Component Value Date   VITAMINB12 166 (L) 02/20/2020   3. Vitamin D deficiency Brenda Navarro's Vitamin D level was 16.5 on 02/20/2020. She is currently taking no vitamin D supplement.  4. Prediabetes Brenda Navarro has a diagnosis of prediabetes based on her elevated HgA1c and was informed this puts her at greater risk of developing diabetes. She continues to work on diet and exercise to decrease her risk of diabetes. She denies nausea or hypoglycemia.  Lab Results  Component Value Date   HGBA1C 5.7 (H) 02/20/2020   Lab Results  Component Value Date   INSULIN 32.1 (H) 02/20/2020   5. At risk for diabetes mellitus Brenda Navarro is at higher than average risk for developing diabetes due to her obesity.   Assessment/Plan:   1. Other fatigue Discussed labs with patient today.  Explained not only due to obesity but symptoms compounded by B12 and vitamin  D deficiencies.  Follow prudent nutritional plan and weight loss.  2. B12 deficiency New.  Discussed labs with patient today.  The diagnosis was reviewed with the patient. Counseling provided today, see below. We will continue to monitor. Orders and follow up as documented in patient record.  Recheck B12 level in 3 months.  Counseling . The body needs vitamin B12: to make red blood cells; to make DNA; and to help the nerves work properly so they can carry messages from the brain to the body.  . The main causes of vitamin B12 deficiency include dietary deficiency, digestive diseases, pernicious anemia, and having a surgery in which part of the stomach or small intestine is removed.  . Certain medicines can make it harder for the body to absorb vitamin B12. These medicines include: heartburn medications; some antibiotics; some medications used to treat diabetes, gout, and high cholesterol.  . In some cases, there are no symptoms of this condition. If the condition leads to anemia or nerve damage, various symptoms can occur, such as weakness or fatigue, shortness of breath, and numbness or tingling in your hands and feet.   . Treatment:  o May include taking vitamin B12 supplements.  o Avoid alcohol.  o Eat lots of healthy foods that contain vitamin B12: - Beef, pork, chicken, [place], and organ meats, such as liver.  - Seafood: This includes clams, rainbow trout, salmon, tuna, and haddock.  Eggs.  - Cereal and dairy products that are fortified: This means that vitamin B12 has been added to the food.   -Start vitamin B-12 (CYANOCOBALAMIN) 500 MCG tablet; Take 1 tablet (500 mcg total) by mouth daily.  Dispense: 30 tablet; Refill: 0  3. Vitamin D deficiency New.  Discussed labs with patient today.  Low Vitamin D level contributes to fatigue and are associated with obesity, breast, and colon cancer. She agrees to start to take prescription Vitamin D @50 ,000 IU every week and will follow-up for routine  testing of Vitamin D, at least 2-3 times per year to avoid over-replacement.  Recheck vitamin D level in 3 months.  -Start Vitamin D, Ergocalciferol, (DRISDOL) 1.25 MG (50000 UNIT) CAPS capsule; Take 1 capsule (50,000 Units total) by mouth every 7 (seven) days.  Dispense: 4 capsule; Refill: 0  4. Prediabetes New.  Discussed labs with patient today.  Brenda Navarro will continue to work on weight loss, exercise, and decreasing simple carbohydrates to help decrease the risk of diabetes.  Recheck A1c and insulin in 3 months.  Weight loss through prudent nutritional plan.  Handouts given.  5. At risk for diabetes mellitus Brenda Navarro was given approximately 30 minutes of diabetes education and counseling today. We discussed intensive lifestyle modifications today with an emphasis on weight loss as well as increasing exercise and decreasing simple carbohydrates in her diet. We also reviewed medication options with an emphasis on risk versus benefit of those discussed.   Repetitive spaced learning was employed today to elicit superior memory formation and behavioral change.  6. Class 3 severe obesity with serious comorbidity and body mass index (BMI) of 40.0 to 44.9 in adult, unspecified obesity type (HCC) Brenda Navarro is currently in the action stage of change. As such, her goal is to continue with weight loss efforts. She has agreed to the Category 2 Plan but gave her a handout on Pescatarian plan for her review only (not changing).  Also gave her a handout on plant based proteins.   Exercise goals: As is.  Behavioral modification strategies: increasing lean protein intake, increasing water intake, no skipping meals, meal planning and cooking strategies and planning for success.  Brenda Navarro has agreed to follow-up with our clinic in 2 weeks. She was informed of the importance of frequent follow-up visits to maximize her success with intensive lifestyle modifications for her multiple health conditions.   Objective:    Blood pressure 122/82, pulse 75, temperature 98.3 F (36.8 C), height 5\' 4"  (1.626 m), weight 248 lb (112.5 kg), SpO2 100 %. Body mass index is 42.57 kg/m.  General: Cooperative, alert, well developed, in no acute distress. HEENT: Conjunctivae and lids unremarkable. Cardiovascular: Regular rhythm.  Lungs: Normal work of breathing. Neurologic: No focal deficits.   Lab Results  Component Value Date   CREATININE 0.71 02/20/2020   BUN 9 02/20/2020   NA 141 02/20/2020   K 4.4 02/20/2020   CL 105 02/20/2020   CO2 23 02/20/2020   Lab Results  Component Value Date   ALT 9 02/20/2020   AST 11 02/20/2020   ALKPHOS 93 02/20/2020   BILITOT 0.4 02/20/2020   Lab Results  Component Value Date   HGBA1C 5.7 (H) 02/20/2020   Lab Results  Component Value Date   INSULIN 32.1 (H) 02/20/2020   Lab Results  Component Value Date   TSH 1.060 02/20/2020   Lab Results  Component Value Date   CHOL 151 02/20/2020   HDL 43 02/20/2020   LDLCALC 90 02/20/2020  TRIG 98 02/20/2020   CHOLHDL 3.5 02/20/2020   Lab Results  Component Value Date   WBC 7.7 02/20/2020   HGB 12.9 02/20/2020   HCT 40.5 02/20/2020   MCV 85 02/20/2020   PLT 319 02/20/2020   Attestation Statements:   Reviewed by clinician on day of visit: allergies, medications, problem list, medical history, surgical history, family history, social history, and previous encounter notes.  I, Insurance claims handler, CMA, am acting as Energy manager for [employer], DO.  I have reviewed the above documentation for accuracy and completeness, and I agree with the above. Thomasene Lot, DO

## 2020-03-19 ENCOUNTER — Ambulatory Visit: Payer: No Typology Code available for payment source | Admitting: Skilled Nursing Facility1

## 2020-03-19 ENCOUNTER — Encounter (INDEPENDENT_AMBULATORY_CARE_PROVIDER_SITE_OTHER): Payer: Self-pay | Admitting: Family Medicine

## 2020-03-19 ENCOUNTER — Ambulatory Visit (INDEPENDENT_AMBULATORY_CARE_PROVIDER_SITE_OTHER): Payer: No Typology Code available for payment source | Admitting: Family Medicine

## 2020-03-19 ENCOUNTER — Other Ambulatory Visit: Payer: Self-pay

## 2020-03-19 VITALS — BP 108/74 | HR 83 | Temp 99.3°F | Ht 64.0 in | Wt 242.0 lb

## 2020-03-19 DIAGNOSIS — E538 Deficiency of other specified B group vitamins: Secondary | ICD-10-CM

## 2020-03-19 DIAGNOSIS — Z6841 Body Mass Index (BMI) 40.0 and over, adult: Secondary | ICD-10-CM | POA: Diagnosis not present

## 2020-03-19 DIAGNOSIS — E559 Vitamin D deficiency, unspecified: Secondary | ICD-10-CM

## 2020-03-19 MED FILL — VIT D2 1.25 MG (50,000 UNIT: 1.25 MG | 28 days supply | Qty: 4 | Fill #0

## 2020-03-19 MED FILL — BUPROPION HCL SR 150 MG TAB: 150 | 30 days supply | Qty: 60 | Fill #1
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
## 2020-04-01 ENCOUNTER — Ambulatory Visit (INDEPENDENT_AMBULATORY_CARE_PROVIDER_SITE_OTHER): Payer: No Typology Code available for payment source | Admitting: Family Medicine

## 2020-04-15 ENCOUNTER — Ambulatory Visit (INDEPENDENT_AMBULATORY_CARE_PROVIDER_SITE_OTHER): Payer: No Typology Code available for payment source | Admitting: Family Medicine

## 2020-04-15 ENCOUNTER — Encounter (INDEPENDENT_AMBULATORY_CARE_PROVIDER_SITE_OTHER): Payer: Self-pay

## 2020-04-20 ENCOUNTER — Ambulatory Visit: Payer: No Typology Code available for payment source | Admitting: Nurse Practitioner

## 2020-06-01 ENCOUNTER — Encounter: Payer: No Typology Code available for payment source | Admitting: Nurse Practitioner

## 2020-06-02 NOTE — Progress Notes (Signed)
This encounter was created in error - please disregard.

## 2020-07-13 ENCOUNTER — Telehealth: Payer: Self-pay | Admitting: Nurse Practitioner

## 2020-07-13 ENCOUNTER — Encounter: Payer: Self-pay | Admitting: Nurse Practitioner

## 2020-07-13 NOTE — Telephone Encounter (Signed)
Pt was no show for appt 06/01/2020 acute/virtual. 2nd occurrence. Billing fee. Letter mailed.

## 2020-08-12 ENCOUNTER — Other Ambulatory Visit: Payer: Self-pay

## 2020-08-13 ENCOUNTER — Other Ambulatory Visit (HOSPITAL_COMMUNITY)
Admission: RE | Admit: 2020-08-13 | Discharge: 2020-08-13 | Disposition: A | Discharge status: Home / Self Care | Payer: No Typology Code available for payment source | Source: Ambulatory Visit | Attending: Nurse Practitioner | Admitting: Nurse Practitioner

## 2020-08-13 ENCOUNTER — Other Ambulatory Visit: Payer: Self-pay | Admitting: Nurse Practitioner

## 2020-08-13 ENCOUNTER — Ambulatory Visit (INDEPENDENT_AMBULATORY_CARE_PROVIDER_SITE_OTHER): Payer: No Typology Code available for payment source | Admitting: Nurse Practitioner

## 2020-08-13 ENCOUNTER — Encounter: Payer: Self-pay | Admitting: Nurse Practitioner

## 2020-08-13 VITALS — BP 122/76 | HR 90 | Temp 97.8°F | Ht 64.0 in | Wt 247.0 lb

## 2020-08-13 DIAGNOSIS — Z113 Encounter for screening for infections with a predominantly sexual mode of transmission: Secondary | ICD-10-CM

## 2020-08-13 DIAGNOSIS — N760 Acute vaginitis: Secondary | ICD-10-CM | POA: Insufficient documentation

## 2020-08-13 MED ORDER — METRONIDAZOLE 500 MG PO TABS: 500.0000 mg | ORAL_TABLET | Freq: Two times a day (BID) | ORAL | 0 refills | Status: DC

## 2020-08-13 NOTE — Progress Notes (Signed)
Subjective:  Patient ID: Brenda Navarro, female    DOB: 1986-06-27  Age: 35 y.o. MRN: [id number]  CC: Acute Visit (Pt c/o vaginal discharge and odor x 2 weeks. Pt would also like to be screened for STDs. )  Vaginal Discharge The patient's primary symptoms include a genital odor and vaginal discharge. The patient's pertinent negatives include no genital itching, genital lesions, genital rash, missed menses, pelvic pain or vaginal bleeding. This is a new problem. The current episode started 1 to 4 weeks ago. The problem occurs constantly. The problem has been unchanged. The patient is experiencing no pain. She is not pregnant. Pertinent negatives include no abdominal pain, back pain, discolored urine, dysuria, fever, flank pain, frequency, painful intercourse, rash or urgency.   Reviewed past Medical, Social and Family history today.  Outpatient Medications Prior to Visit  Medication Sig Dispense Refill  . buPROPion (WELLBUTRIN SR) 150 MG 12 hr tablet Take 1 tablet (150 mg total) by mouth 2 (two) times daily. With food 180 tablet 1  . ergocalciferol (VITAMIN D2) 1.25 MG (50000 UT) capsule Take 50,000 Units by mouth once a week.    Marland Kitchen LARIN 1/20 1-20 MG-MCG tablet Take 1 tablet by mouth daily.    . vitamin B-12 (CYANOCOBALAMIN) 500 MCG tablet Take 1 tablet (500 mcg total) by mouth daily. 30 tablet 0  . Vitamin D, Ergocalciferol, (DRISDOL) 1.25 MG (50000 UNIT) CAPS capsule Take 1 capsule (50,000 Units total) by mouth every 7 (seven) days. 4 capsule 0   No facility-administered medications prior to visit.    ROS See HPI  Objective:  BP 122/76 (BP Location: Left Arm, Patient Position: Sitting, Cuff Size: Large)   Pulse 90   Temp 97.8 F (36.6 C) (Temporal)   Ht 5\' 4"  (1.626 m)   Wt 247 lb (112 kg)   SpO2 98%   BMI 42.40 kg/m   Physical Exam Vitals reviewed. Exam conducted with a chaperone present.  Abdominal:     Hernia: There is no hernia in the left inguinal area or right inguinal  area.  Genitourinary:    Labia:        Right: No rash or tenderness.        Left: No rash or tenderness.      Vagina: No foreign body. Vaginal discharge and erythema present. No tenderness or bleeding.     Cervix: Discharge and erythema present. No cervical motion tenderness.     Adnexa: Right adnexa normal and left adnexa normal.  Lymphadenopathy:     Lower Body: No right inguinal adenopathy. No left inguinal adenopathy.  Neurological:     Mental Status: She is alert.    Assessment & Plan:  This visit occurred during the SARS-CoV-2 public health emergency.  Safety protocols were in place, including screening questions prior to the visit, additional usage of staff PPE, and extensive cleaning of exam room while observing appropriate contact time as indicated for disinfecting solutions.   Evalin was seen today for acute visit.  Diagnoses and all orders for this visit:  Acute vaginitis -     Cervicovaginal ancillary only( [place]) -     metroNIDAZOLE (FLAGYL) 500 MG tablet; Take 1 tablet (500 mg total) by mouth 2 (two) times daily.  Screen for STD (sexually transmitted disease) -     Cervicovaginal ancillary only( [place]) -     HIV antibody (with reflex) -     RPR   Problem List Items Addressed This Visit   None  Visit Diagnoses    Acute vaginitis    -  Primary   Relevant Medications   metroNIDAZOLE (FLAGYL) 500 MG tablet   Other Relevant Orders   Cervicovaginal ancillary only( [place])   Screen for STD (sexually transmitted disease)       Relevant Orders   Cervicovaginal ancillary only( [place])   HIV antibody (with reflex)   RPR      Follow-up: No follow-ups on file.  Alysia Penna, NP

## 2020-08-13 NOTE — Patient Instructions (Signed)
Start metronidazole Go to lab for blood draw

## 2020-08-14 ENCOUNTER — Other Ambulatory Visit: Payer: Self-pay | Admitting: Nurse Practitioner

## 2020-08-14 LAB — CERVICOVAGINAL ANCILLARY ONLY
Bacterial Vaginitis (gardnerella): POSITIVE — AB
Candida Glabrata: NEGATIVE
Candida Vaginitis: POSITIVE — AB
Chlamydia: NEGATIVE
Comment: NEGATIVE
Comment: NEGATIVE
Comment: NEGATIVE
Comment: NEGATIVE
Comment: NEGATIVE
Comment: NORMAL
Neisseria Gonorrhea: NEGATIVE
Trichomonas: NEGATIVE

## 2020-08-14 LAB — RPR: RPR Ser Ql: NONREACTIVE

## 2020-08-14 LAB — HIV ANTIBODY (ROUTINE TESTING W REFLEX): HIV 1&2 Ab, 4th Generation: NONREACTIVE

## 2020-08-14 MED ORDER — FLUCONAZOLE 150 MG PO TABS: 150.0000 mg | ORAL_TABLET | Freq: Every day | ORAL | 0 refills | Status: DC

## 2020-08-14 NOTE — Addendum Note (Signed)
Addended by: Michaela Corner on: 08/14/2020 03:33 PM   Modules accepted: Orders

## 2020-08-15 MED FILL — FLUCONAZOLE 150 MG TABS: 150 | 2 days supply | Qty: 2 | Fill #0

## 2020-08-15 MED FILL — METRONIDAZOLE 500 MG TABS: 500 | 7 days supply | Qty: 14 | Fill #0

## 2020-08-20 ENCOUNTER — Other Ambulatory Visit: Payer: Self-pay

## 2020-08-21 ENCOUNTER — Ambulatory Visit (INDEPENDENT_AMBULATORY_CARE_PROVIDER_SITE_OTHER): Payer: No Typology Code available for payment source | Admitting: Nurse Practitioner

## 2020-08-21 ENCOUNTER — Encounter: Payer: Self-pay | Admitting: Nurse Practitioner

## 2020-08-21 ENCOUNTER — Other Ambulatory Visit: Payer: Self-pay | Admitting: Nurse Practitioner

## 2020-08-21 VITALS — BP 138/70 | HR 81 | Temp 97.6°F | Ht 64.0 in | Wt 245.4 lb

## 2020-08-21 DIAGNOSIS — L989 Disorder of the skin and subcutaneous tissue, unspecified: Secondary | ICD-10-CM | POA: Diagnosis not present

## 2020-08-21 DIAGNOSIS — Z3041 Encounter for surveillance of contraceptive pills: Secondary | ICD-10-CM | POA: Diagnosis not present

## 2020-08-21 DIAGNOSIS — F17200 Nicotine dependence, unspecified, uncomplicated: Secondary | ICD-10-CM

## 2020-08-21 LAB — POCT URINE PREGNANCY: Preg Test, Ur: NEGATIVE

## 2020-08-21 MED ORDER — BUPROPION HCL ER (SR) 150 MG PO TB12: 150.0000 mg | ORAL_TABLET | Freq: Two times a day (BID) | ORAL | 3 refills | Status: DC

## 2020-08-21 MED ORDER — LARIN 1/20 1-20 MG-MCG PO TABS: 1.0000 | ORAL_TABLET | Freq: Every day | ORAL | 11 refills | Status: DC

## 2020-08-21 MED FILL — LARIN 21 1-20 TABLET: 1-20 | 84 days supply | Qty: 84 | Fill #0

## 2020-08-21 MED FILL — BUPROPION HCL SR 150 MG TAB: 150 | 90 days supply | Qty: 180 | Fill #0

## 2020-08-21 NOTE — Assessment & Plan Note (Addendum)
occasionally use: about 4cig per week. Continue use of Wellbutrin BID with no adverse side effects. She is aware of her risk her PE/DVT with current COC use.

## 2020-08-21 NOTE — Patient Instructions (Signed)
Go to lab for urine collection  You will be contacted to schedule appt with dermatology.

## 2020-08-21 NOTE — Progress Notes (Signed)
Subjective:  Patient ID: Hollice Espy, female    DOB: [birth year]  Age: 35 y.o. MRN: [id number]  CC: Acute Visit (Pt c/o black bumps forming on her legs in multiple spots x 2 or more months. Pt states bumps start of red and flat, but turn black and form into a bump )  Rash This is a new problem. The current episode started more than 1 month ago. The problem has been gradually worsening since onset. The affected locations include the left lower leg and right lower leg. The rash is characterized by scaling. She was exposed to nothing. Pertinent negatives include no fever, joint pain or nail changes. Past treatments include nothing. There is no history of allergies or eczema.  hair removal by waxing once a month.  Tobacco use disorder, mild, in controlled environment occasionally use: about 4cig per week. Continue use of Wellbutrin BID with no adverse side effects. She is aware of her risk her PE/DVT with current COC use.  Reviewed past Medical, Social and Family history today.  Outpatient Medications Prior to Visit  Medication Sig Dispense Refill  . ergocalciferol (VITAMIN D2) 1.25 MG (50000 UT) capsule Take 50,000 Units by mouth once a week.    . vitamin B-12 (CYANOCOBALAMIN) 500 MCG tablet Take 1 tablet (500 mcg total) by mouth daily. 30 tablet 0  . buPROPion (WELLBUTRIN SR) 150 MG 12 hr tablet Take 1 tablet (150 mg total) by mouth 2 (two) times daily. With food 180 tablet 1  . LARIN 1/20 1-20 MG-MCG tablet Take 1 tablet by mouth daily.    . fluconazole (DIFLUCAN) 150 MG tablet Take 1 tablet (150 mg total) by mouth daily. Take second tab 3days apart from first tab (Patient not taking: Reported on 08/21/2020) 2 tablet 0  . metroNIDAZOLE (FLAGYL) 500 MG tablet Take 1 tablet (500 mg total) by mouth 2 (two) times daily. (Patient not taking: Reported on 08/21/2020) 14 tablet 0  . Vitamin D, Ergocalciferol, (DRISDOL) 1.25 MG (50000 UNIT) CAPS capsule Take 1 capsule (50,000 Units total) by mouth  every 7 (seven) days. (Patient not taking: Reported on 08/21/2020) 4 capsule 0   No facility-administered medications prior to visit.    ROS See HPI  Objective:  BP 138/70 (BP Location: Left Arm, Patient Position: Sitting, Cuff Size: Large)   Pulse 81   Temp 97.6 F (36.4 C) (Temporal)   Ht 5\' 4"  (1.626 m)   Wt 245 lb 6.4 oz (111.3 kg)   SpO2 98%   BMI 42.12 kg/m   Physical Exam Pulmonary:     Effort: Pulmonary effort is normal.  Musculoskeletal:     Right lower leg: No edema.     Left lower leg: No edema.  Skin:    Findings: Lesion and rash present. No erythema. Rash is nodular.       Neurological:     Mental Status: She is alert and oriented to person, place, and time.    Assessment & Plan:  This visit occurred during the SARS-CoV-2 public health emergency.  Safety protocols were in place, including screening questions prior to the visit, additional usage of staff PPE, and extensive cleaning of exam room while observing appropriate contact time as indicated for disinfecting solutions.   Roman was seen today for acute visit.  Diagnoses and all orders for this visit:  Skin lesion of lower extremity -     Ambulatory referral to Dermatology  Encounter for surveillance of contraceptive pills -     POCT urine  pregnancy -     LARIN 1/20 1-20 MG-MCG tablet; Take 1 tablet by mouth daily.  Tobacco use disorder, mild, in controlled environment -     buPROPion (WELLBUTRIN SR) 150 MG 12 hr tablet; Take 1 tablet (150 mg total) by mouth 2 (two) times daily. With food  Advised to stop waxing Also advised to stop tobacco use while using COC with estrogen due to risk of PE/DVT. She verbalized understanding.  Problem List Items Addressed This Visit      Other   Tobacco use disorder, mild, in controlled environment    occasionally use: about 4cig per week. Continue use of Wellbutrin BID with no adverse side effects. She is aware of her risk her PE/DVT with current COC use.       Relevant Medications   buPROPion (WELLBUTRIN SR) 150 MG 12 hr tablet    Other Visit Diagnoses    Skin lesion of lower extremity    -  Primary   Relevant Orders   Ambulatory referral to Dermatology   Encounter for surveillance of contraceptive pills       Relevant Medications   LARIN 1/20 1-20 MG-MCG tablet   Other Relevant Orders   POCT urine pregnancy (Completed)      Follow-up: Return in about 6 months (around 02/18/2021) for CPE (fasting).  Alysia Penna, NP

## 2021-02-22 ENCOUNTER — Encounter: Payer: No Typology Code available for payment source | Admitting: Nurse Practitioner

## 2021-02-25 ENCOUNTER — Other Ambulatory Visit (HOSPITAL_COMMUNITY)
Admission: RE | Admit: 2021-02-25 | Discharge: 2021-02-25 | Disposition: A | Discharge status: Home / Self Care | Payer: No Typology Code available for payment source | Source: Ambulatory Visit | Attending: Nurse Practitioner | Admitting: Nurse Practitioner

## 2021-02-25 ENCOUNTER — Other Ambulatory Visit (HOSPITAL_COMMUNITY): Payer: Self-pay

## 2021-02-25 ENCOUNTER — Other Ambulatory Visit: Payer: Self-pay

## 2021-02-25 ENCOUNTER — Encounter: Payer: Self-pay | Admitting: Nurse Practitioner

## 2021-02-25 ENCOUNTER — Ambulatory Visit (INDEPENDENT_AMBULATORY_CARE_PROVIDER_SITE_OTHER): Payer: No Typology Code available for payment source | Admitting: Nurse Practitioner

## 2021-02-25 VITALS — BP 122/76 | HR 84 | Temp 97.6°F | Ht 64.0 in | Wt 250.4 lb

## 2021-02-25 DIAGNOSIS — N760 Acute vaginitis: Secondary | ICD-10-CM | POA: Diagnosis not present

## 2021-02-25 DIAGNOSIS — R3915 Urgency of urination: Secondary | ICD-10-CM | POA: Diagnosis not present

## 2021-02-25 DIAGNOSIS — K5900 Constipation, unspecified: Secondary | ICD-10-CM

## 2021-02-25 LAB — POCT URINALYSIS DIPSTICK
Bilirubin, UA: NEGATIVE
Blood, UA: NEGATIVE
Glucose, UA: NEGATIVE
Ketones, UA: NEGATIVE
Leukocytes, UA: NEGATIVE
Nitrite, UA: NEGATIVE
Protein, UA: NEGATIVE
Spec Grav, UA: 1.01 (ref 1.010–1.025)
Urobilinogen, UA: 0.2 E.U./dL
pH, UA: 7.5 (ref 5.0–8.0)

## 2021-02-25 MED ORDER — SENNA-DOCUSATE SODIUM 8.6-50 MG PO TABS
1.0000 | ORAL_TABLET | Freq: Two times a day (BID) | ORAL | 1 refills | Status: DC
  Filled 2021-02-25: qty 30, 15d supply, fill #0

## 2021-02-25 NOTE — Progress Notes (Signed)
Subjective:  Patient ID: Brenda Navarro, female    DOB: 01-03-1986  Age: 35 y.o. MRN: [id number]  CC: Acute Visit (Pt c/o of possible UTI. Symptoms are vaginal discharge that is cloudy and milky, vaginal odor, when she goes to urinate she has to bare down and the flow will start and stop. Pt states she has had BV in the past and thinks it could possibly be this also. )  Constipation This is a new problem. The current episode started 1 to 4 weeks ago. The problem is unchanged. Her stool frequency is 1 time per week or less. The stool is described as pellet like and firm. The patient is not on a high fiber diet. She Does not exercise regularly. There has Not been adequate water intake. Associated symptoms include abdominal pain, difficulty urinating and flatus. Pertinent negatives include no anorexia, back pain, bloating, diarrhea, fecal incontinence, fever, hematochezia, hemorrhoids, nausea, rectal pain, vomiting or weight loss. Risk factors include obesity and stress. She has tried laxatives for the symptoms. The treatment provided mild relief. There is no history of irritable bowel syndrome or metabolic disease.  Vaginal Discharge The patient's primary symptoms include a genital odor, pelvic pain and vaginal discharge. The patient's pertinent negatives include no genital itching, genital lesions, genital rash or missed menses. This is a new problem. The current episode started 1 to 4 weeks ago. The problem occurs constantly. The problem has been unchanged. The pain is mild. The problem affects both sides. She is not pregnant. Associated symptoms include abdominal pain, constipation and frequency. Pertinent negatives include no anorexia, back pain, chills, diarrhea, discolored urine, dysuria, fever, flank pain, joint pain, joint swelling, nausea, painful intercourse, rash, urgency or vomiting. The vaginal discharge was white. The vaginal bleeding is typical of menses. She has not been passing clots. She  has not been passing tissue. Nothing aggravates the symptoms. She has tried nothing for the symptoms. She is sexually active. No, her partner does not have an STD. She uses condoms for contraception. Her menstrual history has been regular. Her past medical history is significant for vaginosis. There is no history of PID, an STD or a terminated pregnancy.   Reviewed past Medical, Social and Family history today.  Outpatient Medications Prior to Visit  Medication Sig Dispense Refill   buPROPion (WELLBUTRIN SR) 150 MG 12 hr tablet TAKE 1 TABLET BY MOUTH 2 TIMES DAILY WITH FOOD 180 tablet 3   ergocalciferol (VITAMIN D2) 1.25 MG (50000 UT) capsule Take 50,000 Units by mouth once a week. (Patient not taking: Reported on 02/25/2021)     LARIN 1/20 1-20 MG-MCG tablet TAKE 1 TABLET BY MOUTH DAILY. (Patient not taking: Reported on 02/25/2021) 21 tablet 11   vitamin B-12 (CYANOCOBALAMIN) 500 MCG tablet Take 1 tablet (500 mcg total) by mouth daily. (Patient not taking: Reported on 02/25/2021) 30 tablet 0   No facility-administered medications prior to visit.    ROS See HPI  Objective:  BP 122/76 (BP Location: Left Arm, Patient Position: Sitting, Cuff Size: Large)   Pulse 84   Temp 97.6 F (36.4 C) (Temporal)   Ht 5\' 4"  (1.626 m)   Wt 250 lb 6.4 oz (113.6 kg)   LMP 02/08/2021 (Exact Date)   SpO2 97%   BMI 42.98 kg/m   Physical Exam Vitals reviewed. Exam conducted with a chaperone present.  Constitutional:      General: She is not in acute distress. Pulmonary:     Effort: Pulmonary effort is normal.  Abdominal:     General: There is no distension.     Palpations: Abdomen is soft.     Tenderness: There is abdominal tenderness. There is no guarding.  Genitourinary:    Labia:        Right: No rash, tenderness, lesion or injury.        Left: No rash, tenderness, lesion or injury.      Urethra: No urethral pain or urethral swelling.     Vagina: Vaginal discharge present. No erythema,  tenderness, bleeding or lesions.     Cervix: Discharge present. No cervical motion tenderness, friability or erythema.     Uterus: Not enlarged and not tender.      Adnexa: Right adnexa normal and left adnexa normal.  Lymphadenopathy:     Lower Body: No right inguinal adenopathy. No left inguinal adenopathy.  Neurological:     Mental Status: She is alert.   Assessment & Plan:  This visit occurred during the SARS-CoV-2 public health emergency.  Safety protocols were in place, including screening questions prior to the visit, additional usage of staff PPE, and extensive cleaning of exam room while observing appropriate contact time as indicated for disinfecting solutions.   Corey was seen today for acute visit.  Diagnoses and all orders for this visit:  Constipation, unspecified constipation type -     sennosides-docusate sodium (SENOKOT-S) 8.6-50 MG tablet; Take 1 tablet by mouth 2 (two) times daily. Hold if diarrhea  Urinary urgency -     POCT Urinalysis Dipstick -     Urine Culture  Acute vaginitis -     Cervicovaginal ancillary only( [place]) Normal urinalysis Symptoms possibly due to constipation especially in absence of CMT, adnexal, and uterine tenderness.  Problem List Items Addressed This Visit       Other   Urinary urgency   Relevant Orders   POCT Urinalysis Dipstick (Completed)   Urine Culture   Other Visit Diagnoses     Constipation, unspecified constipation type    -  Primary   Relevant Medications   sennosides-docusate sodium (SENOKOT-S) 8.6-50 MG tablet   Acute vaginitis       Relevant Orders   Cervicovaginal ancillary only( [place])       Follow-up: No follow-ups on file.  Alysia Penna, NP

## 2021-02-25 NOTE — Patient Instructions (Signed)
Maintain high fiber diet and adequate oral hydration with water. Call office if no soft bowel movement in 3days.  Constipation, Adult Constipation is when a person has fewer than three bowel movements in a week, has difficulty having a bowel movement, or has stools (feces) that are dry, hard, or larger than normal. Constipation may be caused by an underlying condition. It may become worse with age if a person takes certainmedicines and does not take in enough fluids. Follow these instructions at home: Eating and drinking  Eat foods that have a lot of fiber, such as beans, whole grains, and fresh fruits and vegetables. Limit foods that are low in fiber and high in fat and processed sugars, such as fried or sweet foods. These include french fries, hamburgers, cookies, candies, and soda. Drink enough fluid to keep your urine pale yellow.  General instructions Exercise regularly or as told by your health care provider. Try to do 150 minutes of moderate exercise each week. Use the bathroom when you have the urge to go. Do not hold it in. Take over-the-counter and prescription medicines only as told by your health care provider. This includes any fiber supplements. During bowel movements: Practice deep breathing while relaxing the lower abdomen. Practice pelvic floor relaxation. Watch your condition for any changes. Let your health care provider know about them. Keep all follow-up visits as told by your health care provider. This is important. Contact a health care provider if: You have pain that gets worse. You have a fever. You do not have a bowel movement after 4 days. You vomit. You are not hungry or you lose weight. You are bleeding from the opening between the buttocks (anus). You have thin, pencil-like stools. Get help right away if: You have a fever and your symptoms suddenly get worse. You leak stool or have blood in your stool. Your abdomen is bloated. You have severe pain in  your abdomen. You feel dizzy or you faint. Summary Constipation is when a person has fewer than three bowel movements in a week, has difficulty having a bowel movement, or has stools (feces) that are dry, hard, or larger than normal. Eat foods that have a lot of fiber, such as beans, whole grains, and fresh fruits and vegetables. Drink enough fluid to keep your urine pale yellow. Take over-the-counter and prescription medicines only as told by your health care provider. This includes any fiber supplements. This information is not intended to replace advice given to you by your health care provider. Make sure you discuss any questions you have with your healthcare provider. Document Revised: 05/15/2019 Document Reviewed: 05/15/2019 Elsevier Patient Education  2022 [employer].

## 2021-02-26 LAB — CERVICOVAGINAL ANCILLARY ONLY
Bacterial Vaginitis (gardnerella): NEGATIVE
Candida Glabrata: NEGATIVE
Candida Vaginitis: NEGATIVE
Chlamydia: NEGATIVE
Comment: NEGATIVE
Comment: NEGATIVE
Comment: NEGATIVE
Comment: NEGATIVE
Comment: NEGATIVE
Comment: NORMAL
Neisseria Gonorrhea: NEGATIVE
Trichomonas: NEGATIVE

## 2021-02-26 LAB — URINE CULTURE
MICRO NUMBER:: 12260887
Result:: NO GROWTH
SPECIMEN QUALITY:: ADEQUATE

## 2021-03-03 ENCOUNTER — Encounter: Payer: Self-pay | Admitting: Nurse Practitioner

## 2021-03-10 ENCOUNTER — Encounter: Payer: Self-pay | Admitting: Nurse Practitioner

## 2021-03-10 ENCOUNTER — Encounter: Payer: No Typology Code available for payment source | Admitting: Nurse Practitioner

## 2021-03-31 ENCOUNTER — Other Ambulatory Visit (HOSPITAL_COMMUNITY): Payer: Self-pay

## 2021-03-31 MED FILL — Bupropion HCl Tab ER 12HR 150 MG: ORAL | 90 days supply | Qty: 180 | Fill #0 | Status: CN

## 2021-04-08 ENCOUNTER — Other Ambulatory Visit (HOSPITAL_COMMUNITY): Payer: Self-pay

## 2021-04-19 ENCOUNTER — Ambulatory Visit (INDEPENDENT_AMBULATORY_CARE_PROVIDER_SITE_OTHER): Payer: No Typology Code available for payment source | Admitting: Nurse Practitioner

## 2021-04-19 ENCOUNTER — Encounter: Payer: Self-pay | Admitting: Nurse Practitioner

## 2021-04-19 ENCOUNTER — Other Ambulatory Visit (HOSPITAL_COMMUNITY): Payer: Self-pay

## 2021-04-19 ENCOUNTER — Other Ambulatory Visit: Payer: Self-pay

## 2021-04-19 VITALS — BP 130/76 | HR 80 | Temp 97.0°F | Ht 64.0 in | Wt 250.6 lb

## 2021-04-19 DIAGNOSIS — Z23 Encounter for immunization: Secondary | ICD-10-CM | POA: Diagnosis not present

## 2021-04-19 DIAGNOSIS — F17200 Nicotine dependence, unspecified, uncomplicated: Secondary | ICD-10-CM

## 2021-04-19 DIAGNOSIS — R87619 Unspecified abnormal cytological findings in specimens from cervix uteri: Secondary | ICD-10-CM | POA: Insufficient documentation

## 2021-04-19 DIAGNOSIS — Z6841 Body Mass Index (BMI) 40.0 and over, adult: Secondary | ICD-10-CM

## 2021-04-19 DIAGNOSIS — E559 Vitamin D deficiency, unspecified: Secondary | ICD-10-CM | POA: Diagnosis not present

## 2021-04-19 DIAGNOSIS — R7303 Prediabetes: Secondary | ICD-10-CM | POA: Diagnosis not present

## 2021-04-19 DIAGNOSIS — Z716 Tobacco abuse counseling: Secondary | ICD-10-CM

## 2021-04-19 HISTORY — DX: Unspecified abnormal cytological findings in specimens from cervix uteri: R87.619

## 2021-04-19 LAB — COMPREHENSIVE METABOLIC PANEL
ALT: 9 U/L (ref 0–35)
AST: 10 U/L (ref 0–37)
Albumin: 3.9 g/dL (ref 3.5–5.2)
Alkaline Phosphatase: 89 U/L (ref 39–117)
BUN: 10 mg/dL (ref 6–23)
CO2: 27 mEq/L (ref 19–32)
Calcium: 9.5 mg/dL (ref 8.4–10.5)
Chloride: 107 mEq/L (ref 96–112)
Creatinine, Ser: 0.77 mg/dL (ref 0.40–1.20)
GFR: 101.07 mL/min (ref >60.00)
Glucose, Bld: 114 mg/dL — ABNORMAL HIGH (ref 70–99)
Potassium: 4.4 mEq/L (ref 3.5–5.1)
Sodium: 141 mEq/L (ref 135–145)
Total Bilirubin: 0.2 mg/dL (ref 0.2–1.2)
Total Protein: 6.6 g/dL (ref 6.0–8.3)

## 2021-04-19 LAB — HEMOGLOBIN A1C: Hgb A1c MFr Bld: 5.9 % (ref 4.6–6.5)

## 2021-04-19 LAB — TSH: TSH: 1.41 u[IU]/mL (ref 0.35–5.50)

## 2021-04-19 MED ORDER — BUPROPION HCL ER (SR) 200 MG PO TB12
200.0000 mg | ORAL_TABLET | Freq: Two times a day (BID) | ORAL | 5 refills | Status: DC
  Filled 2021-04-19: qty 60, 30d supply, fill #0

## 2021-04-19 NOTE — Progress Notes (Signed)
Subjective:  Patient ID: Brenda Navarro, female    DOB: 1985-10-30  Age: 35 y.o. MRN: [id number]  CC: Follow-up (Pt would like to discuss weight loss medication )   HPI Tobacco use disorder, mild, in controlled environment Continuous tobacco use Reports she has decreased from 1/2ppd to 1/3ppd in last 63months. She has quit in last x 3-1months, then resumed due to increased stress. Current use of wellbutirn 150mg  BID. Denies any adverse side effects.  Advised to use nicorette gum or nicotine patch to curve nicotine cravings. Advised to set another quit day and work of eliminating cigarette use by that date. Increased wellbitrin to 200mg  BID F/up in 95month  Class 3 severe obesity with serious comorbidity and body mass index (BMI) of 40.0 to 44.9 in adult (HCC) Lowest weight 135lbs at age 20 Highest weight: 250lbs She has made the following lifestyle changes in last 60months: meal prepping, small meal portions and increased exercise (cardio-elliptical/stair climbing/treadmill/zumba class) 2-3x/week. She wants to start GLP-1 injection. Denies hx of thyroid cancer, pancreatitis or gallbladder disease. Advised about possible sexanda side effects. Check CMP, HgbA1c, and TSH Review lab results prior to sending Rx F/up in 37month Wt Readings from Last 3 Encounters:  04/19/21 250 lb 9.6 oz (113.7 kg)  02/25/21 250 lb 6.4 oz (113.6 kg)  08/21/20 245 lb 6.4 oz (111.3 kg)   Wt Readings from Last 3 Encounters:  04/19/21 250 lb 9.6 oz (113.7 kg)  02/25/21 250 lb 6.4 oz (113.6 kg)  08/21/20 245 lb 6.4 oz (111.3 kg)   BP Readings from Last 3 Encounters:  04/19/21 130/76  02/25/21 122/76  08/21/20 138/70    Reviewed past Medical, Social and Family history today.  Outpatient Medications Prior to Visit  Medication Sig Dispense Refill   buPROPion (WELLBUTRIN SR) 150 MG 12 hr tablet TAKE 1 TABLET BY MOUTH 2 TIMES DAILY WITH FOOD 180 tablet 3   sennosides-docusate sodium (SENOKOT-S) 8.6-50 MG  tablet Take 1 tablet by mouth 2 (two) times daily. Hold if diarrhea (Patient not taking: Reported on 04/19/2021) 30 tablet 1   No facility-administered medications prior to visit.    ROS See HPI  Objective:  BP 130/76 (BP Location: Left Arm, Patient Position: Sitting, Cuff Size: Large)   Pulse 80   Temp (!) 97 F (36.1 C) (Temporal)   Ht 5\' 4"  (1.626 m)   Wt 250 lb 9.6 oz (113.7 kg)   SpO2 98%   BMI 43.02 kg/m   Physical Exam Constitutional:      Appearance: She is obese.  Cardiovascular:     Rate and Rhythm: Normal rate.     Pulses: Normal pulses.  Pulmonary:     Effort: Pulmonary effort is normal.  Neurological:     Mental Status: She is alert and oriented to person, place, and time.  Psychiatric:        Mood and Affect: Mood normal.        Behavior: Behavior normal.        Thought Content: Thought content normal.    Assessment & Plan:  This visit occurred during the SARS-CoV-2 public health emergency.  Safety protocols were in place, including screening questions prior to the visit, additional usage of staff PPE, and extensive cleaning of exam room while observing appropriate contact time as indicated for disinfecting solutions.   Latisa was seen today for follow-up.  Diagnoses and all orders for this visit:  Class 3 severe obesity due to excess calories with serious comorbidity and body  mass index (BMI) of 40.0 to 44.9 in adult (HCC) -     TSH -     Comprehensive metabolic panel  Flu vaccine need -     Flu Vaccine QUAD 6+ mos PF IM (Fluarix Quad PF)  Prediabetes -     Hemoglobin A1c  Vitamin D deficiency -     Vitamin D 1,25 dihydroxy  Tobacco use disorder, mild, in controlled environment -     buPROPion (WELLBUTRIN SR) 200 MG 12 hr tablet; Take 1 tablet (200 mg total) by mouth 2 (two) times daily.  Encounter for tobacco use cessation counseling   Problem List Items Addressed This Visit       Other   Class 3 severe obesity with serious comorbidity  and body mass index (BMI) of 40.0 to 44.9 in adult (HCC) - Primary    Lowest weight 135lbs at age 49 Highest weight: 250lbs She has made the following lifestyle changes in last 55months: meal prepping, small meal portions and increased exercise (cardio-elliptical/stair climbing/treadmill/zumba class) 2-3x/week. She wants to start GLP-1 injection. Denies hx of thyroid cancer, pancreatitis or gallbladder disease. Advised about possible sexanda side effects. Check CMP, HgbA1c, and TSH Review lab results prior to sending Rx F/up in 23month Wt Readings from Last 3 Encounters:  04/19/21 250 lb 9.6 oz (113.7 kg)  02/25/21 250 lb 6.4 oz (113.6 kg)  08/21/20 245 lb 6.4 oz (111.3 kg)       Relevant Orders   TSH   Comprehensive metabolic panel   Prediabetes   Relevant Orders   Hemoglobin A1c   Tobacco use disorder, mild, in controlled environment    Continuous tobacco use Reports she has decreased from 1/2ppd to 1/3ppd in last 44months. She has quit in last x 3-23months, then resumed due to increased stress. Current use of wellbutirn 150mg  BID. Denies any adverse side effects.  Advised to use nicorette gum or nicotine patch to curve nicotine cravings. Advised to set another quit day and work of eliminating cigarette use by that date. Increased wellbitrin to 200mg  BID F/up in 37month      Relevant Medications   buPROPion (WELLBUTRIN SR) 200 MG 12 hr tablet   Vitamin D deficiency   Relevant Orders   Vitamin D 1,25 dihydroxy   Other Visit Diagnoses     Flu vaccine need       Relevant Orders   Flu Vaccine QUAD 6+ mos PF IM (Fluarix Quad PF)   Encounter for tobacco use cessation counseling           Follow-up: Return in about 4 weeks (around 05/17/2021) for weight management.  2month, NP

## 2021-04-19 NOTE — Assessment & Plan Note (Signed)
Continuous tobacco use Reports she has decreased from 1/2ppd to 1/3ppd in last 69months. She has quit in last x 3-89months, then resumed due to increased stress. Current use of wellbutirn 150mg  BID. Denies any adverse side effects.  Advised to use nicorette gum or nicotine patch to curve nicotine cravings. Advised to set another quit day and work of eliminating cigarette use by that date. Increased wellbitrin to 200mg  BID F/up in 54month

## 2021-04-19 NOTE — Patient Instructions (Signed)
Go to lab for blood draw. Will send saxenda rx after lab review  How to Increase Your Level of Physical Activity Getting regular physical activity is important for your overall health and well-being. Most people do not get enough exercise. There are easy ways to increase your level of physical activity, even if you have not been very active in the past or if you are just starting out. What are the benefits of physical activity? Physical activity has many short-term and long-term benefits. Being active on a regular basis can improve your physical and mental health as well as provide other benefits. Physical health benefits Helping you lose weight or maintain a healthy weight. Strengthening your muscles and bones. Reducing your risk of certain long-term (chronic) diseases, including heart disease, cancer, and diabetes. Being able to move around more easily and for longer periods of time without getting tired (increased endurance or stamina). Improving your ability to fight off illness (enhanced immunity). Being able to sleep better. Helping you stay healthy as you get older, including: Helping you stay mobile, or capable of walking and moving around. Preventing accidents, such as falls. Increasing life expectancy. Mental health benefits Boosting your mood and improving your self-esteem. Lowering your chance of having mental health problems, such as depression or anxiety. Helping you feel good about your body. Other benefits Finding new sources of fun and enjoyment. Meeting new people who share a common interest. Before you begin If you have a chronic illness or have not been active for a while, check with your health care provider about how to get started. Ask your health care provider what activities are safe for you. Start out slowly. Walking or doing some simple chair exercises is a good place to start, especially if you have not been active before or for a long time. Set goals that you  can work toward. Ask your health care provider how much exercise is best for you. In general, most adults should: Do moderate-intensity exercise for at least 150 minutes each week (30 minutes on most days of the week) or vigorous exercise for at least 75 minutes each week, or a combination of these. Moderate-intensity exercise can include walking at a quick pace, biking, yoga, water aerobics, or gardening. Vigorous exercise involves activities that take more effort, such as jogging or running, playing sports, swimming laps, or jumping rope. Do strength exercises on at least 2 days each week. This can include weight lifting, body weight exercises, and resistance-band exercises. How to be more physically active Make a plan  Try to find activities that you enjoy. You are more likely to commit to an exercise routine if it does not feel like a chore. If you have bone or joint problems, choose low-impact exercises, like walking or swimming. Use these tips for being successful with an exercise plan: Find a workout partner for accountability. Join a group or class, such as an aerobics class, cycling class, or sports team. Make family time active. Go for a walk, bike, or swim. Include a variety of exercises each week. Consider using a fitness tracker, such as a mobile phone app or a device worn like a watch, that will count the number of steps you take each day. Many people strive to reach 10,000 steps a day. Find ways to be active in your daily routines Besides your formal exercise plans, you can find ways to do physical activity during your daily routines, such as: Walking or biking to work or to the store. Taking  the stairs instead of the elevator. Parking farther away from the door at work or at the store. Planning walking meetings. Walking around while you are on the phone. Where to find more information Centers for Disease Control and Prevention: CampusCasting.com.pt President's Council  on Fitness, Sports & Nutrition: www.fitness.gov ChooseMyPlate: http://www.harvey.com/ Contact a health care provider if: You have headaches, muscle aches, or joint pain that is concerning. You feel dizzy or light-headed while exercising. You faint. You feel your heart skipping, racing, or fluttering. You have chest pain while exercising. Summary Exercise benefits your mind and body at any age, even if you are just starting out. If you have a chronic illness or have not been active for a while, check with your health care provider before increasing your physical activity. Choose activities that are safe and enjoyable for you. Ask your health care provider what activities are safe for you. Start slowly. Tell your health care provider if you have problems as you start to increase your activity level. This information is not intended to replace advice given to you by your health care provider. Make sure you discuss any questions you have with your health care provider. Document Revised: 10/23/2020 Document Reviewed: 10/23/2020 Elsevier Patient Education  2022 Elsevier Inc.  Calorie Counting for [employer] Calories are units of energy. Your body needs a certain number of calories from food to keep going throughout the day. When you eat or drink more calories than your body needs, your body stores the extra calories mostly as fat. When you eat or drink fewer calories than your body needs, your body burns fat to get the energy it needs. Calorie counting means keeping track of how many calories you eat and drink each day. Calorie counting can be helpful if you need to lose weight. If you eat fewer calories than your body needs, you should lose weight. Ask your health care provider what a healthy weight is for you. For calorie counting to work, you will need to eat the right number of calories each day to lose a healthy amount of weight per week. A dietitian can help you figure out how many calories you need  in a day and will suggest ways to reach your calorie goal. A healthy amount of weight to lose each week is usually 1-2 lb (0.5-0.9 kg). This usually means that your daily calorie intake should be reduced by 500-750 calories. Eating 1,200-1,500 calories a day can help most women lose weight. Eating 1,500-1,800 calories a day can help most men lose weight. What do I need to know about calorie counting? Work with your health care provider or dietitian to determine how many calories you should get each day. To meet your daily calorie goal, you will need to: Find out how many calories are in each food that you would like to eat. Try to do this before you eat. Decide how much of the food you plan to eat. Keep a food log. Do this by writing down what you ate and how many calories it had. To successfully lose weight, it is important to balance calorie counting with a healthy lifestyle that includes regular activity. Where do I find calorie information? The number of calories in a food can be found on a Nutrition Facts label. If a food does not have a Nutrition Facts label, try to look up the calories online or ask your dietitian for help. Remember that calories are listed per serving. If you choose to have more than  one serving of a food, you will have to multiply the calories per serving by the number of servings you plan to eat. For example, the label on a package of bread might say that a serving size is 1 slice and that there are 90 calories in a serving. If you eat 1 slice, you will have eaten 90 calories. If you eat 2 slices, you will have eaten 180 calories. How do I keep a food log? After each time that you eat, record the following in your food log as soon as possible: What you ate. Be sure to include toppings, sauces, and other extras on the food. How much you ate. This can be measured in cups, ounces, or number of items. How many calories were in each food and drink. The total number of calories  in the food you ate. Keep your food log near you, such as in a pocket-sized notebook or on an app or website on your mobile phone. Some programs will calculate calories for you and show you how many calories you have left to meet your daily goal. What are some portion-control tips? Know how many calories are in a serving. This will help you know how many servings you can have of a certain food. Use a measuring cup to measure serving sizes. You could also try weighing out portions on a kitchen scale. With time, you will be able to estimate serving sizes for some foods. Take time to put servings of different foods on your favorite plates or in your favorite bowls and cups so you know what a serving looks like. Try not to eat straight from a food's packaging, such as from a bag or box. Eating straight from the package makes it hard to see how much you are eating and can lead to overeating. Put the amount you would like to eat in a cup or on a plate to make sure you are eating the right portion. Use smaller plates, glasses, and bowls for smaller portions and to prevent overeating. Try not to multitask. For example, avoid watching TV or using your computer while eating. If it is time to eat, sit down at a table and enjoy your food. This will help you recognize when you are full. It will also help you be more mindful of what and how much you are eating. What are tips for following this plan? Reading food labels Check the calorie count compared with the serving size. The serving size may be smaller than what you are used to eating. Check the source of the calories. Try to choose foods that are high in protein, fiber, and vitamins, and low in saturated fat, trans fat, and sodium. Shopping Read nutrition labels while you shop. This will help you make healthy decisions about which foods to buy. Pay attention to nutrition labels for low-fat or fat-free foods. These foods sometimes have the same number of  calories or more calories than the full-fat versions. They also often have added sugar, starch, or salt to make up for flavor that was removed with the fat. Make a grocery list of lower-calorie foods and stick to it. Cooking Try to cook your favorite foods in a healthier way. For example, try baking instead of frying. Use low-fat dairy products. Meal planning Use more fruits and vegetables. One-half of your plate should be fruits and vegetables. Include lean proteins, such as chicken, [place], and fish. Lifestyle Each week, aim to do one of the following: 150 minutes of  moderate exercise, such as walking. 75 minutes of vigorous exercise, such as running. General information Know how many calories are in the foods you eat most often. This will help you calculate calorie counts faster. Find a way of tracking calories that works for you. Get creative. Try different apps or programs if writing down calories does not work for you. What foods should I eat?  Eat nutritious foods. It is better to have a nutritious, high-calorie food, such as an avocado, than a food with few nutrients, such as a bag of potato chips. Use your calories on foods and drinks that will fill you up and will not leave you hungry soon after eating. Examples of foods that fill you up are nuts and nut butters, vegetables, lean proteins, and high-fiber foods such as whole grains. High-fiber foods are foods with more than 5 g of fiber per serving. Pay attention to calories in drinks. Low-calorie drinks include water and unsweetened drinks. The items listed above may not be a complete list of foods and beverages you can eat. Contact a dietitian for more information. What foods should I limit? Limit foods or drinks that are not good sources of vitamins, minerals, or protein or that are high in unhealthy fats. These include: Candy. Other sweets. Sodas, specialty coffee drinks, alcohol, and juice. The items listed above may not be a  complete list of foods and beverages you should avoid. Contact a dietitian for more information. How do I count calories when eating out? Pay attention to portions. Often, portions are much larger when eating out. Try these tips to keep portions smaller: Consider sharing a meal instead of getting your own. If you get your own meal, eat only half of it. Before you start eating, ask for a container and put half of your meal into it. When available, consider ordering smaller portions from the menu instead of full portions. Pay attention to your food and drink choices. Knowing the way food is cooked and what is included with the meal can help you eat fewer calories. If calories are listed on the menu, choose the lower-calorie options. Choose dishes that include vegetables, fruits, whole grains, low-fat dairy products, and lean proteins. Choose items that are boiled, broiled, grilled, or steamed. Avoid items that are buttered, battered, fried, or served with cream sauce. Items labeled as crispy are usually fried, unless stated otherwise. Choose water, low-fat milk, unsweetened iced tea, or other drinks without added sugar. If you want an alcoholic beverage, choose a lower-calorie option, such as a glass of wine or light beer. Ask for dressings, sauces, and syrups on the side. These are usually high in calories, so you should limit the amount you eat. If you want a salad, choose a garden salad and ask for grilled meats. Avoid extra toppings such as bacon, cheese, or fried items. Ask for the dressing on the side, or ask for olive oil and vinegar or lemon to use as dressing. Estimate how many servings of a food you are given. Knowing serving sizes will help you be aware of how much food you are eating at restaurants. Where to find more information Centers for Disease Control and Prevention: FootballExhibition.com.br U.S. Department of Agriculture: WrestlingReporter.dk Summary Calorie counting means keeping track of how many  calories you eat and drink each day. If you eat fewer calories than your body needs, you should lose weight. A healthy amount of weight to lose per week is usually 1-2 lb (0.5-0.9 kg). This usually means  reducing your daily calorie intake by 500-750 calories. The number of calories in a food can be found on a Nutrition Facts label. If a food does not have a Nutrition Facts label, try to look up the calories online or ask your dietitian for help. Use smaller plates, glasses, and bowls for smaller portions and to prevent overeating. Use your calories on foods and drinks that will fill you up and not leave you hungry shortly after a meal. This information is not intended to replace advice given to you by your health care provider. Make sure you discuss any questions you have with your health care provider. Document Revised: 08/08/2019 Document Reviewed: 08/08/2019 Elsevier Patient Education  2022 [employer].

## 2021-04-19 NOTE — Assessment & Plan Note (Addendum)
Lowest weight 135lbs at age 35 Highest weight: 250lbs She has made the following lifestyle changes in last 48months: meal prepping, small meal portions and increased exercise (cardio-elliptical/stair climbing/treadmill/zumba class) 2-3x/week. She wants to start GLP-1 injection. Denies hx of thyroid cancer, pancreatitis or gallbladder disease. Advised about possible sexanda side effects. Check CMP, HgbA1c, and TSH Review lab results prior to sending Rx F/up in 8month Wt Readings from Last 3 Encounters:  04/19/21 250 lb 9.6 oz (113.7 kg)  02/25/21 250 lb 6.4 oz (113.6 kg)  08/21/20 245 lb 6.4 oz (111.3 kg)

## 2021-04-20 ENCOUNTER — Encounter: Payer: Self-pay | Admitting: Nurse Practitioner

## 2021-04-21 ENCOUNTER — Other Ambulatory Visit (HOSPITAL_COMMUNITY): Payer: Self-pay

## 2021-04-21 LAB — VITAMIN D 1,25 DIHYDROXY
Vitamin D 1, 25 (OH)2 Total: 32 pg/mL (ref 18–72)
Vitamin D2 1, 25 (OH)2: <8 pg/mL
Vitamin D3 1, 25 (OH)2: 32 pg/mL

## 2021-04-21 MED ORDER — INSULIN PEN NEEDLE 31G X 6 MM MISC
1.0000 "application " | Freq: Every day | 0 refills | Status: DC
  Filled 2021-04-21 – 2021-05-05 (×2): qty 100, 90d supply, fill #0

## 2021-04-21 MED ORDER — SAXENDA 18 MG/3ML ~~LOC~~ SOPN
PEN_INJECTOR | SUBCUTANEOUS | 0 refills | Status: DC
  Filled 2021-04-21: qty 15, 30d supply, fill #0

## 2021-04-21 NOTE — Addendum Note (Signed)
Addended by: Michaela Corner on: 04/21/2021 03:24 PM   Modules accepted: Orders

## 2021-04-22 ENCOUNTER — Other Ambulatory Visit (HOSPITAL_COMMUNITY): Payer: Self-pay

## 2021-04-24 ENCOUNTER — Other Ambulatory Visit (HOSPITAL_COMMUNITY): Payer: Self-pay

## 2021-04-26 ENCOUNTER — Telehealth: Payer: Self-pay | Admitting: Nurse Practitioner

## 2021-04-26 ENCOUNTER — Other Ambulatory Visit (HOSPITAL_COMMUNITY): Payer: Self-pay

## 2021-04-27 ENCOUNTER — Other Ambulatory Visit (HOSPITAL_COMMUNITY): Payer: Self-pay

## 2021-04-27 ENCOUNTER — Encounter: Payer: Self-pay | Admitting: Nurse Practitioner

## 2021-04-27 ENCOUNTER — Telehealth: Payer: Self-pay | Admitting: Nurse Practitioner

## 2021-04-27 NOTE — Telephone Encounter (Signed)
Pt is calling checking on a PA approval form that should have been faxed for Liraglutide -Weight Management (SAXENDA) 18 MG/3ML SOPN [[id number]] . Please advise pt at [phone], she is very anxious.

## 2021-04-28 ENCOUNTER — Other Ambulatory Visit (HOSPITAL_COMMUNITY): Payer: Self-pay

## 2021-04-29 ENCOUNTER — Encounter: Payer: Self-pay | Admitting: Nurse Practitioner

## 2021-04-29 ENCOUNTER — Telehealth: Payer: Self-pay | Admitting: Nurse Practitioner

## 2021-04-29 ENCOUNTER — Other Ambulatory Visit (HOSPITAL_COMMUNITY): Payer: Self-pay

## 2021-04-30 ENCOUNTER — Other Ambulatory Visit (HOSPITAL_COMMUNITY): Payer: Self-pay

## 2021-04-30 NOTE — Telephone Encounter (Signed)
Information regarding this documented in another encounter. Closing this encounter.

## 2021-04-30 NOTE — Telephone Encounter (Signed)
Spoke with pharmacy and requested it to be sent again because I do not see a fax for medication. Also checked covermymeds because pharmacy stated it should be there but was unable to locate it there either. Informed patient we  are working on it and hope to have it completed by end of day.

## 2021-04-30 NOTE — Telephone Encounter (Signed)
No I haven't seen anything.  Dm/cma

## 2021-04-30 NOTE — Telephone Encounter (Signed)
Received PA today via fax and entered all information in CoverMyMeds. Pt notified.

## 2021-05-03 ENCOUNTER — Other Ambulatory Visit (HOSPITAL_COMMUNITY): Payer: Self-pay

## 2021-05-05 ENCOUNTER — Other Ambulatory Visit (HOSPITAL_COMMUNITY): Payer: Self-pay

## 2021-05-19 ENCOUNTER — Other Ambulatory Visit: Payer: Self-pay

## 2021-05-20 ENCOUNTER — Other Ambulatory Visit (HOSPITAL_COMMUNITY): Payer: Self-pay

## 2021-05-20 ENCOUNTER — Encounter: Payer: Self-pay | Admitting: Nurse Practitioner

## 2021-05-20 ENCOUNTER — Ambulatory Visit (INDEPENDENT_AMBULATORY_CARE_PROVIDER_SITE_OTHER): Payer: No Typology Code available for payment source | Admitting: Nurse Practitioner

## 2021-05-20 VITALS — BP 118/62 | HR 84 | Temp 97.3°F | Wt 245.8 lb

## 2021-05-20 DIAGNOSIS — Z6841 Body Mass Index (BMI) 40.0 and over, adult: Secondary | ICD-10-CM

## 2021-05-20 DIAGNOSIS — R7303 Prediabetes: Secondary | ICD-10-CM | POA: Diagnosis not present

## 2021-05-20 DIAGNOSIS — F17200 Nicotine dependence, unspecified, uncomplicated: Secondary | ICD-10-CM | POA: Diagnosis not present

## 2021-05-20 DIAGNOSIS — Z716 Tobacco abuse counseling: Secondary | ICD-10-CM | POA: Diagnosis not present

## 2021-05-20 MED ORDER — NICOTINE 7 MG/24HR TD PT24
7.0000 mg | MEDICATED_PATCH | Freq: Every day | TRANSDERMAL | 0 refills | Status: DC
  Filled 2021-05-20: qty 28, 28d supply, fill #0

## 2021-05-20 MED ORDER — SAXENDA 18 MG/3ML ~~LOC~~ SOPN
PEN_INJECTOR | SUBCUTANEOUS | 2 refills | Status: DC
  Filled 2021-05-20: qty 3, fill #0
  Filled 2021-06-23: qty 3, 10d supply, fill #0

## 2021-05-20 NOTE — Assessment & Plan Note (Signed)
Decreased to 2-3cig per day. Smokes while driving to and from work. Does not like taste of nicotine gym  Provided Nicoderm 7mg  rx. Use nicotine patch and chewing bubble gum to manage nicotine cravings. F/up in 74month

## 2021-05-20 NOTE — Progress Notes (Signed)
Subjective:  Patient ID: Brenda Navarro, female    DOB: 26-Aug-1985  Age: 35 y.o. MRN: [id number]  CC: Follow-up (4 week f/u on weight loss. /Declines vaccine today.)  HPI Tobacco use disorder, mild, in controlled environment Decreased to 2-3cig per day. Smokes while driving to and from work. Does not like taste of nicotine gym  Provided Nicoderm 7mg  rx. Use nicotine patch and chewing bubble gum to manage nicotine cravings. F/up in 36month  Class 3 severe obesity with serious comorbidity and body mass index (BMI) of 40.0 to 44.9 in adult [facility]) Lost 5lbs in last 4weeks Current use of Saxenda 1.2mg  Denies any adverse side effects Exercise: yoga 2x/week, walking 4x/week x [facility] each. Diet: decreased portion size. Struggles with eating a full meal. Wt Readings from Last 3 Encounters:  05/20/21 245 lb 12.8 oz (111.5 kg)  04/19/21 250 lb 9.6 oz (113.7 kg)  02/25/21 250 lb 6.4 oz (113.6 kg)   Continue sexanda Advised to eat small frequent meals. Always include protein and vegetable. Also incorporate weight training exercise 2x/week Continue moderate intensity cardio exercise 3-4x/week. F/up in 45month  BP Readings from Last 3 Encounters:  05/20/21 118/62  04/19/21 130/76  02/25/21 122/76    Reviewed past Medical, Social and Family history today.  Outpatient Medications Prior to Visit  Medication Sig Dispense Refill   buPROPion (WELLBUTRIN SR) 200 MG 12 hr tablet Take 1 tablet (200 mg total) by mouth 2 (two) times daily. 60 tablet 5   Insulin Pen Needle 31G X 6 MM MISC Use as directed with saxenda daily 100 each 0   Liraglutide -Weight Management (SAXENDA) 18 MG/3ML SOPN Inject 0.6mg  under the skin daily x 2weeks, then 1.2mg  daily x 1week, then 1.8mg  daily thereafter 15 mL 0   No facility-administered medications prior to visit.    ROS See HPI  Objective:  BP 118/62 (BP Location: Left Arm, Patient Position: Sitting, Cuff Size: Large)   Pulse 84   Temp (!) 97.3 F (36.3  C) (Temporal)   Wt 245 lb 12.8 oz (111.5 kg)   SpO2 98%   BMI 42.19 kg/m   Physical Exam Constitutional:      Appearance: She is obese.  Cardiovascular:     Rate and Rhythm: Normal rate.     Pulses: Normal pulses.  Pulmonary:     Effort: Pulmonary effort is normal.  Abdominal:     General: Bowel sounds are normal. There is no distension.     Palpations: Abdomen is soft.     Tenderness: There is no abdominal tenderness. There is no guarding.  Neurological:     Mental Status: She is alert and oriented to person, place, and time.  Psychiatric:        Mood and Affect: Mood normal.        Behavior: Behavior normal.    Assessment & Plan:  This visit occurred during the SARS-CoV-2 public health emergency.  Safety protocols were in place, including screening questions prior to the visit, additional usage of staff PPE, and extensive cleaning of exam room while observing appropriate contact time as indicated for disinfecting solutions.   Ahlaya was seen today for follow-up.  Diagnoses and all orders for this visit:  Tobacco use disorder, mild, in controlled environment -     nicotine (NICODERM CQ) 7 mg/24hr patch; Place 1 patch (7 mg total) onto the skin daily.  Class 3 severe obesity due to excess calories with serious comorbidity and body mass index (BMI) of 40.0 to  44.9 in adult (HCC) -     Liraglutide -Weight Management (SAXENDA) 18 MG/3ML SOPN; Inject 1.2mg  daily x 1week, then 1.8mg  daily continuously  Prediabetes -     Liraglutide -Weight Management (SAXENDA) 18 MG/3ML SOPN; Inject 1.2mg  daily x 1week, then 1.8mg  daily continuously   Problem List Items Addressed This Visit       Other   Class 3 severe obesity with serious comorbidity and body mass index (BMI) of 40.0 to 44.9 in adult [facility])    Lost 5lbs in last 4weeks Current use of Saxenda 1.2mg  Denies any adverse side effects Exercise: yoga 2x/week, walking 4x/week x each. Diet: decreased portion size. Struggles  with eating a full meal. Wt Readings from Last 3 Encounters:  05/20/21 245 lb 12.8 oz (111.5 kg)  04/19/21 250 lb 9.6 oz (113.7 kg)  02/25/21 250 lb 6.4 oz (113.6 kg)   Continue sexanda Advised to eat small frequent meals. Always include protein and vegetable. Also incorporate weight training exercise 2x/week Continue moderate intensity cardio exercise 3-4x/week. F/up in 23month      Relevant Medications   Liraglutide -Weight Management (SAXENDA) 18 MG/3ML SOPN   Prediabetes   Relevant Medications   Liraglutide -Weight Management (SAXENDA) 18 MG/3ML SOPN   Tobacco use disorder, mild, in controlled environment - Primary    Decreased to 2-3cig per day. Smokes while driving to and from work. Does not like taste of nicotine gym  Provided Nicoderm 7mg  rx. Use nicotine patch and chewing bubble gum to manage nicotine cravings. F/up in 40month      Relevant Medications   nicotine (NICODERM CQ) 7 mg/24hr patch    Follow-up: Return for maintain upcoming appt.  2month, NP

## 2021-05-20 NOTE — Assessment & Plan Note (Addendum)
Lost 5lbs in last 4weeks Current use of Saxenda 1.2mg  Denies any adverse side effects Exercise: yoga 2x/week, walking 4x/week x each. Diet: decreased portion size. Struggles with eating a full meal. Wt Readings from Last 3 Encounters:  05/20/21 245 lb 12.8 oz (111.5 kg)  04/19/21 250 lb 9.6 oz (113.7 kg)  02/25/21 250 lb 6.4 oz (113.6 kg)   Continue sexanda Advised to eat small frequent meals. Always include protein and vegetable. Also incorporate weight training exercise 2x/week Continue moderate intensity cardio exercise 3-4x/week. F/up in 35month

## 2021-05-20 NOTE — Patient Instructions (Signed)
Use nicotine patch and chewing bubble gum to manage nicotine cravings Continue sexanda injections: 1.2mg  x 1week, then 1.8mg  contiunously Also incorporate weight training exercise 2x/week Continue moderate intensity cardio exercise 3-4x/week. Maintain upcoming appt  Managing the Challenge of Quitting Smoking Quitting smoking is a physical and mental challenge. You will face cravings, withdrawal symptoms, and temptation. Before quitting, work with your health care provider to make a plan that can help you manage quitting. Preparation can help you quit and keep you from giving in. How to manage lifestyle changes Managing stress Stress can make you want to smoke, and wanting to smoke may cause stress. It is important to find ways to manage your stress. You might try some of the following: Practice relaxation techniques. Breathe slowly and deeply, in through your nose and out through your mouth. Listen to music. Soak in a bath or take a shower. Imagine a peaceful place or vacation. Get some support. Talk with family or friends about your stress. Join a support group. Talk with a counselor or therapist. Get some physical activity. Go for a walk, run, or bike ride. Play a favorite sport. Practice yoga.  Medicines Talk with your health care provider about medicines that might help you deal with cravings and make quitting easier for you. Relationships Social situations can be difficult when you are quitting smoking. To manage this, you can: Avoid parties and other social situations where people might be smoking. Avoid alcohol. Leave right away if you have the urge to smoke. Explain to your family and friends that you are quitting smoking. Ask for support and let them know you might be a bit grumpy. Plan activities where smoking is not an option. General instructions Be aware that many people gain weight after they quit smoking. However, not everyone does. To keep from gaining weight, have  a plan in place before you quit and stick to the plan after you quit. Your plan should include: Having healthy snacks. When you have a craving, it may help to: Eat popcorn, carrots, celery, or other cut vegetables. Chew sugar-free gum. Changing how you eat. Eat small portion sizes at meals. Eat 4-6 small meals throughout the day instead of 1-2 large meals a day. Be mindful when you eat. Do not watch television or do other things that might distract you as you eat. Exercising regularly. Make time to exercise each day. If you do not have time for a long workout, do short bouts of exercise for 5-10 minutes several times a day. Do some form of strengthening exercise, such as weight lifting. Do some exercise that gets your heart beating and causes you to breathe deeply, such as walking fast, running, swimming, or biking. This is very important. Drinking plenty of water or other low-calorie or no-calorie drinks. Drink 6-8 glasses of water daily.  How to recognize withdrawal symptoms Your body and mind may experience discomfort as you try to get used to not having nicotine in your system. These effects are called withdrawal symptoms. They may include: Feeling hungrier than normal. Having trouble concentrating. Feeling irritable or restless. Having trouble sleeping. Feeling depressed. Craving a cigarette. To manage withdrawal symptoms: Avoid places, people, and activities that trigger your cravings. Remember why you want to quit. Get plenty of sleep. Avoid coffee and other caffeinated drinks. These may worsen some of your symptoms. These symptoms may surprise you. But be assured that they are normal to have when quitting smoking. How to manage cravings Come up with a plan for  how to deal with your cravings. The plan should include the following: A definition of the specific situation you want to deal with. An alternative action you will take. A clear idea for how this action will help. The  name of someone who might help you with this. Cravings usually last for 5-10 minutes. Consider taking the following actions to help you with your plan to deal with cravings: Keep your mouth busy. Chew sugar-free gum. Suck on hard candies or a straw. Brush your teeth. Keep your hands and body busy. Change to a different activity right away. Squeeze or play with a ball. Do an activity or a hobby, such as making bead jewelry, practicing needlepoint, or working with wood. Mix up your normal routine. Take a short exercise break. Go for a quick walk or run up and down stairs. Focus on doing something kind or helpful for someone else. Call a friend or family member to talk during a craving. Join a support group. Contact a quitline. Where to find support To get help or find a support group: Call the National Cancer Institute's Smoking Quitline: 1-800-QUIT NOW 843-715-2407) Visit the website of the Substance Abuse and Mental Health Services Administration: SkateOasis.com.pt Text QUIT to SmokefreeTXT: 390300 Where to find more information Visit these websites to find more information on quitting smoking: National Cancer Institute: www.smokefree.gov American Lung Association: www.lung.org American Cancer Society: www.cancer.org Centers for Disease Control and Prevention: FootballExhibition.com.br American Heart Association: www.heart.org Contact a health care provider if: You want to change your plan for quitting. The medicines you are taking are not helping. Your eating feels out of control or you cannot sleep. Get help right away if: You feel depressed or become very anxious. Summary Quitting smoking is a physical and mental challenge. You will face cravings, withdrawal symptoms, and temptation to smoke again. Preparation can help you as you go through these challenges. Try different techniques to manage stress, handle social situations, and prevent weight gain. You can deal with cravings by keeping your  mouth busy (such as by chewing gum), keeping your hands and body busy, calling family or friends, or contacting a quitline for people who want to quit smoking. You can deal with withdrawal symptoms by avoiding places where people smoke, getting plenty of rest, and avoiding drinks with caffeine. This information is not intended to replace advice given to you by your health care provider. Make sure you discuss any questions you have with your health care provider. Document Revised: 03/05/2021 Document Reviewed: 04/16/2019 Elsevier Patient Education  2022 [employer].

## 2021-05-31 ENCOUNTER — Other Ambulatory Visit (HOSPITAL_COMMUNITY): Payer: Self-pay

## 2021-06-23 ENCOUNTER — Other Ambulatory Visit (HOSPITAL_COMMUNITY): Payer: Self-pay

## 2021-06-23 ENCOUNTER — Other Ambulatory Visit: Payer: Self-pay | Admitting: Nurse Practitioner

## 2021-06-23 DIAGNOSIS — R7303 Prediabetes: Secondary | ICD-10-CM

## 2021-06-26 ENCOUNTER — Other Ambulatory Visit (HOSPITAL_COMMUNITY): Payer: Self-pay

## 2021-06-28 ENCOUNTER — Other Ambulatory Visit (HOSPITAL_COMMUNITY): Payer: Self-pay

## 2021-06-28 ENCOUNTER — Other Ambulatory Visit: Payer: Self-pay | Admitting: Nurse Practitioner

## 2021-06-28 DIAGNOSIS — Z6841 Body Mass Index (BMI) 40.0 and over, adult: Secondary | ICD-10-CM

## 2021-06-28 DIAGNOSIS — R7303 Prediabetes: Secondary | ICD-10-CM

## 2021-06-29 ENCOUNTER — Other Ambulatory Visit (HOSPITAL_COMMUNITY): Payer: Self-pay

## 2021-06-29 MED ORDER — SAXENDA 18 MG/3ML ~~LOC~~ SOPN
PEN_INJECTOR | SUBCUTANEOUS | 0 refills | Status: DC
  Filled 2021-06-29: qty 15, 30d supply, fill #0

## 2021-06-29 NOTE — Telephone Encounter (Signed)
Chart supports Rx Medication sent over.

## 2021-06-29 NOTE — Telephone Encounter (Signed)
Pt called to follow up on this, she said the pharmacy sent over a refill request last week but hasnt heard anything, pt said she takes her last injection today. Please advise

## 2021-06-30 ENCOUNTER — Other Ambulatory Visit: Payer: Self-pay

## 2021-06-30 ENCOUNTER — Encounter: Payer: Self-pay | Admitting: Nurse Practitioner

## 2021-06-30 ENCOUNTER — Other Ambulatory Visit (HOSPITAL_COMMUNITY): Payer: Self-pay

## 2021-06-30 ENCOUNTER — Ambulatory Visit (INDEPENDENT_AMBULATORY_CARE_PROVIDER_SITE_OTHER): Payer: No Typology Code available for payment source | Admitting: Nurse Practitioner

## 2021-06-30 VITALS — BP 116/72 | HR 94 | Temp 97.7°F | Ht 63.75 in | Wt 237.8 lb

## 2021-06-30 DIAGNOSIS — Z0001 Encounter for general adult medical examination with abnormal findings: Secondary | ICD-10-CM | POA: Diagnosis not present

## 2021-06-30 DIAGNOSIS — F17200 Nicotine dependence, unspecified, uncomplicated: Secondary | ICD-10-CM | POA: Diagnosis not present

## 2021-06-30 DIAGNOSIS — R7303 Prediabetes: Secondary | ICD-10-CM | POA: Diagnosis not present

## 2021-06-30 DIAGNOSIS — Z6841 Body Mass Index (BMI) 40.0 and over, adult: Secondary | ICD-10-CM | POA: Diagnosis not present

## 2021-06-30 MED ORDER — SAXENDA 18 MG/3ML ~~LOC~~ SOPN
PEN_INJECTOR | SUBCUTANEOUS | 1 refills | Status: DC
  Filled 2021-06-30: qty 15, 30d supply, fill #0
  Filled 2021-06-30: qty 15, fill #0
  Filled 2021-07-27 – 2021-08-04 (×2): qty 15, 30d supply, fill #1

## 2021-06-30 NOTE — Patient Instructions (Signed)
Will repeat labs during upcoming appt. Increase saxenda to 2.4mg  daily Send mychart message with weight in 29month. If no adverse side eefcts with 2.4mg  dose, will need to change med to ozempic or wegovy or mounjaro. Continue diet and exercise Continue to cut down tobacco use.  Insomnia Insomnia is a sleep disorder that makes it difficult to fall asleep or stay asleep. Insomnia can cause fatigue, low energy, difficulty concentrating, mood swings, and poor performance at work or school. There are three different ways to classify insomnia: Difficulty falling asleep. Difficulty staying asleep. Waking up too early in the morning. Any type of insomnia can be long-term (chronic) or short-term (acute). Both are common. Short-term insomnia usually lasts for three months or less. Chronic insomnia occurs at least three times a week for longer than three months. What are the causes? Insomnia may be caused by another condition, situation, or substance, such as: Anxiety. Certain medicines. Gastroesophageal reflux disease (GERD) or other gastrointestinal conditions. Asthma or other breathing conditions. Restless legs syndrome, sleep apnea, or other sleep disorders. Chronic pain. Menopause. Stroke. Abuse of alcohol, tobacco, or illegal drugs. Mental health conditions, such as depression. Caffeine. Neurological disorders, such as Alzheimer's disease. An overactive thyroid (hyperthyroidism). Sometimes, the cause of insomnia may not be known. What increases the risk? Risk factors for insomnia include: Gender. Women are affected more often than men. Age. Insomnia is more common as you get older. Stress. Lack of exercise. Irregular work schedule or working night shifts. Traveling between different time zones. Certain medical and mental health conditions. What are the signs or symptoms? If you have insomnia, the main symptom is having trouble falling asleep or having trouble staying asleep. This may  lead to other symptoms, such as: Feeling fatigued or having low energy. Feeling nervous about going to sleep. Not feeling rested in the morning. Having trouble concentrating. Feeling irritable, anxious, or depressed. How is this diagnosed? This condition may be diagnosed based on: Your symptoms and medical history. Your health care provider may ask about: Your sleep habits. Any medical conditions you have. Your mental health. A physical exam. How is this treated? Treatment for insomnia depends on the cause. Treatment may focus on treating an underlying condition that is causing insomnia. Treatment may also include: Medicines to help you sleep. Counseling or therapy. Lifestyle adjustments to help you sleep better. Follow these instructions at home: Eating and drinking  Limit or avoid alcohol, caffeinated beverages, and cigarettes, especially close to bedtime. These can disrupt your sleep. Do not eat a large meal or eat spicy foods right before bedtime. This can lead to digestive discomfort that can make it hard for you to sleep. Sleep habits  Keep a sleep diary to help you and your health care provider figure out what could be causing your insomnia. Write down: When you sleep. When you wake up during the night. How well you sleep. How rested you feel the next day. Any side effects of medicines you are taking. What you eat and drink. Make your bedroom a dark, comfortable place where it is easy to fall asleep. Put up shades or blackout curtains to block light from outside. Use a white noise machine to block noise. Keep the temperature cool. Limit screen use before bedtime. This includes: Watching TV. Using your smartphone, tablet, or computer. Stick to a routine that includes going to bed and waking up at the same times every day and night. This can help you fall asleep faster. Consider making a quiet activity, such  as reading, part of your nighttime routine. Try to avoid taking  naps during the day so that you sleep better at night. Get out of bed if you are still awake after 15 minutes of trying to sleep. Keep the lights down, but try reading or doing a quiet activity. When you feel sleepy, go back to bed. General instructions Take over-the-counter and prescription medicines only as told by your health care provider. Exercise regularly, as told by your health care provider. Avoid exercise starting several hours before bedtime. Use relaxation techniques to manage stress. Ask your health care provider to suggest some techniques that may work well for you. These may include: Breathing exercises. Routines to release muscle tension. Visualizing peaceful scenes. Make sure that you drive carefully. Avoid driving if you feel very sleepy. Keep all follow-up visits as told by your health care provider. This is important. Contact a health care provider if: You are tired throughout the day. You have trouble in your daily routine due to sleepiness. You continue to have sleep problems, or your sleep problems get worse. Get help right away if: You have serious thoughts about hurting yourself or someone else. If you ever feel like you may hurt yourself or others, or have thoughts about taking your own life, get help right away. You can go to your nearest emergency department or call: Your local emergency services (911 in the [place] A suicide crisis helpline, such as the National Suicide Prevention Lifeline at 612-208-5111 or 988 in the [place] This is open 24 hours a day. Summary Insomnia is a sleep disorder that makes it difficult to fall asleep or stay asleep. Insomnia can be long-term (chronic) or short-term (acute). Treatment for insomnia depends on the cause. Treatment may focus on treating an underlying condition that is causing insomnia. Keep a sleep diary to help you and your health care provider figure out what could be causing your insomnia. This information is not  intended to replace advice given to you by your health care provider. Make sure you discuss any questions you have with your health care provider. Document Revised: 01/20/2021 Document Reviewed: 05/07/2020 Elsevier Patient Education  2022 [employer].

## 2021-06-30 NOTE — Progress Notes (Signed)
Subjective:    Patient ID: Brenda Navarro, female    DOB: 1985/09/19, 35 y.o.   MRN: [id number]  Patient presents today for CPE and eval of chronic conditions  HPI Class 3 severe obesity with serious comorbidity and body mass index (BMI) of 40.0 to 44.9 in adult [facility]) Current use of saxenda 1.8mg  daily. Denies any adverse side effects Diet: low fat and low carb Exercise: cardio and weight training 2-3x/week. Lost 13lbs in last 2months Lost 8lbs since last appt. Weight:  Wt Readings from Last 3 Encounters:  06/30/21 237 lb 12.8 oz (107.9 kg)  05/20/21 245 lb 12.8 oz (111.5 kg)  04/19/21 250 lb 9.6 oz (113.7 kg)   Increase saxenda dose to 2.4mg  daily Consider changing to ozempic or wegovy or mounjaro if covered by insurance. Continue diet and exercise F/up in 59month  Tobacco use disorder, mild, in controlled environment Continues to smoke 1/4ppd Did not use nicotine patch as prescribed She plans to continue cutting down  Vision:up to date Dental:up to date Sexual History (orientation,birth control, marital status, STD):up to date with pelvic and breast exam, no need for STD screen  Depression/Suicide: Depression screen [facility] 2/9 06/30/2021 08/21/2020 02/20/2020 02/07/2020 09/25/2019  Decreased Interest 0 1 3 0 0  Down, Depressed, Hopeless 0 1 2 0 0  PHQ - 2 Score 0 2 5 0 0  Altered sleeping - -  Tired, decreased energy - -  Change in appetite 0 2 2 - -  Feeling bad or failure about yourself  0 0 3 - -  Trouble concentrating 0 1 1 - -  Moving slowly or fidgety/restless 0 0 3 - -  Suicidal thoughts 0 0 1 - -  PHQ-9 Score - -  Difficult doing work/chores Somewhat difficult Somewhat difficult Very difficult - -   GAD 7 : Generalized Anxiety Score 06/30/2021 08/21/2020  Nervous, Anxious, on Edge 2 2  Control/stop worrying 1 2  Worry too much - different things 1 2  Trouble relaxing 1 1  Restless 0 0  Easily annoyed or irritable 0 2  Afraid - awful might  happen 0 3  Total GAD 7 Score 5 12  Anxiety Difficulty Somewhat difficult Somewhat difficult   Immunizations: (TDAP, Hep C screen, Pneumovax, Influenza, zoster)  Health Maintenance  Topic Date Due   COVID-19 Vaccine (4 - Booster for Pfizer series) 05/15/2020   Pneumococcal Vaccination (1 - PCV) 05/20/2022*   Hepatitis C Screening: USPSTF Recommendation to screen - Ages 18-79 yo.  07/01/2022*   Pap Smear  09/25/2022   Tetanus Vaccine  12/30/2024   Flu Shot  Completed   HPV Vaccine  Completed   HIV Screening  Completed  *Topic was postponed. The date shown is not the original due date.   Fall Risk: Fall Risk  02/07/2020 09/25/2019  Falls in the past year? 0 -  Number falls in past yr: - 0  Injury with Fall? - 0   Medications and allergies reviewed with patient and updated if appropriate.  Patient Active Problem List   Diagnosis Date Noted   Abnormal Pap smear of cervix 04/19/2021   Other fatigue 03/05/2020   Vitamin B12 deficiency 03/05/2020   Vitamin D deficiency 03/05/2020   Prediabetes 03/05/2020   Class 3 severe obesity with serious comorbidity and body mass index (BMI) of 40.0 to 44.9 in adult [facility]) 03/05/2020   Tobacco use disorder, mild, in controlled environment 09/27/2019   Morbid  obesity (HCC) 09/25/2019   Mixed emotional features as adjustment reaction 08/24/2016    Current Outpatient Medications on File Prior to Visit  Medication Sig Dispense Refill   buPROPion (WELLBUTRIN SR) 200 MG 12 hr tablet Take 1 tablet (200 mg total) by mouth 2 (two) times daily. 60 tablet 5   Insulin Pen Needle 31G X 6 MM MISC Use as directed with saxenda daily 100 each 0   nicotine (NICODERM CQ) 7 mg/24hr patch Place 1 patch (7 mg total) onto the skin daily. (Patient not taking: Reported on 06/30/2021) 28 patch 0   No current facility-administered medications on file prior to visit.    Past Medical History:  Diagnosis Date   Abnormal Pap smear    Back pain    Constipation     Depression    Hip pain    Joint pain    Knee pain    Medical history non-contributory    SOB (shortness of breath)     Past Surgical History:  Procedure Laterality Date   ABDOMINAL SURGERY     iud perforated uterus, had to remove   COLPOSCOPY  2011   NO PAST SURGERIES      Social History   Socioeconomic History   Marital status: Divorced    Spouse name: Not on file   Number of children: 3   Years of education: Not on file   Highest education level: Not on file  Occupational History   Occupation: nurse tech  Tobacco Use   Smoking status: Every Day    Packs/day: 0.25    Years: 10.00    Pack years: 2.50    Types: Cigarettes   Smokeless tobacco: Never  Vaping Use   Vaping Use: Never used  Substance and Sexual Activity   Alcohol use: No   Drug use: No   Sexual activity: Not Currently    Birth control/protection: Abstinence  Other Topics Concern   Not on file  Social History Narrative   Not on file   Social Determinants of Health   Financial Resource Strain: Not on file  Food Insecurity: Not on file  Transportation Needs: Not on file  Physical Activity: Not on file  Stress: Not on file  Social Connections: Not on file    Family History  Adopted: Yes  Family history unknown: Yes        Review of Systems  Constitutional:  Negative for fever, malaise/fatigue and weight loss.  HENT:  Negative for congestion and sore throat.   Eyes:        Negative for visual changes  Respiratory:  Negative for cough and shortness of breath.   Cardiovascular:  Negative for chest pain, palpitations and leg swelling.  Gastrointestinal:  Negative for blood in stool, constipation, diarrhea and heartburn.  Genitourinary:  Negative for dysuria, frequency and urgency.  Musculoskeletal:  Negative for falls, joint pain and myalgias.  Skin:  Negative for rash.  Neurological:  Negative for dizziness, sensory change and headaches.  Endo/Heme/Allergies:  Does not bruise/bleed easily.   Psychiatric/Behavioral:  Negative for depression, substance abuse and suicidal ideas. The patient has insomnia. The patient is not nervous/anxious.    Objective:   Vitals:   06/30/21 1255  BP: 116/72  Pulse: 94  Temp: 97.7 F (36.5 C)  SpO2: 98%   Body mass index is 41.14 kg/m.   Physical Examination:  Physical Exam Vitals reviewed.  Constitutional:      General: She is not in acute distress.  Appearance: She is well-developed. She is obese.  HENT:     Right Ear: Tympanic membrane, ear canal and external ear normal.     Left Ear: Tympanic membrane, ear canal and external ear normal.  Eyes:     Extraocular Movements: Extraocular movements intact.     Conjunctiva/sclera: Conjunctivae normal.  Cardiovascular:     Rate and Rhythm: Normal rate and regular rhythm.     Heart sounds: Normal heart sounds.  Pulmonary:     Effort: Pulmonary effort is normal. No respiratory distress.     Breath sounds: Normal breath sounds.  Chest:     Chest wall: No tenderness.  Abdominal:     General: Bowel sounds are normal.     Palpations: Abdomen is soft.  Musculoskeletal:        General: Normal range of motion.     Cervical back: Normal range of motion and neck supple.     Right lower leg: No edema.     Left lower leg: No edema.  Neurological:     Mental Status: She is alert and oriented to person, place, and time.     Deep Tendon Reflexes: Reflexes are normal and symmetric.  Psychiatric:        Mood and Affect: Mood normal.        Behavior: Behavior normal.        Thought Content: Thought content normal.   ASSESSMENT and PLAN: This visit occurred during the SARS-CoV-2 public health emergency.  Safety protocols were in place, including screening questions prior to the visit, additional usage of staff PPE, and extensive cleaning of exam room while observing appropriate contact time as indicated for disinfecting solutions.   Brenda Navarro was seen today for annual exam.  Diagnoses and  all orders for this visit:  Encounter for preventative adult health care exam with abnormal findings  Class 3 severe obesity due to excess calories with serious comorbidity and body mass index (BMI) of 40.0 to 44.9 in adult (HCC) -     Liraglutide -Weight Management (SAXENDA) 18 MG/3ML SOPN; Inject 2.4mg  under the skin daily  Prediabetes -     Liraglutide -Weight Management (SAXENDA) 18 MG/3ML SOPN; Inject 2.4mg  under the skin daily  Tobacco use disorder, mild, in controlled environment      Problem List Items Addressed This Visit       Other   Class 3 severe obesity with serious comorbidity and body mass index (BMI) of 40.0 to 44.9 in adult [facility])    Current use of saxenda 1.8mg  daily. Denies any adverse side effects Diet: low fat and low carb Exercise: cardio and weight training 2-3x/week. Lost 13lbs in last 31months Lost 8lbs since last appt. Weight:  Wt Readings from Last 3 Encounters:  06/30/21 237 lb 12.8 oz (107.9 kg)  05/20/21 245 lb 12.8 oz (111.5 kg)  04/19/21 250 lb 9.6 oz (113.7 kg)   Increase saxenda dose to 2.4mg  daily Consider changing to ozempic or wegovy or mounjaro if covered by insurance. Continue diet and exercise F/up in 23month      Relevant Medications   Liraglutide -Weight Management (SAXENDA) 18 MG/3ML SOPN   Prediabetes   Relevant Medications   Liraglutide -Weight Management (SAXENDA) 18 MG/3ML SOPN   Tobacco use disorder, mild, in controlled environment    Continues to smoke 1/4ppd Did not use nicotine patch as prescribed She plans to continue cutting down      Other Visit Diagnoses     Encounter for preventative adult health  care exam with abnormal findings    -  Primary       Follow up: Return in about 3 months (around 09/28/2021) for weight management, prediabetes (fasting).  Alysia Penna, NP

## 2021-07-01 ENCOUNTER — Encounter: Payer: Self-pay | Admitting: Nurse Practitioner

## 2021-07-01 NOTE — Assessment & Plan Note (Addendum)
Current use of saxenda 1.8mg  daily. Denies any adverse side effects Diet: low fat and low carb Exercise: cardio and weight training 2-3x/week. Lost 13lbs in last 34months Lost 8lbs since last appt. Weight:  Wt Readings from Last 3 Encounters:  06/30/21 237 lb 12.8 oz (107.9 kg)  05/20/21 245 lb 12.8 oz (111.5 kg)  04/19/21 250 lb 9.6 oz (113.7 kg)   Increase saxenda dose to 2.4mg  daily Consider changing to ozempic or wegovy or mounjaro if covered by insurance. Continue diet and exercise F/up in 65month

## 2021-07-01 NOTE — Assessment & Plan Note (Signed)
Continues to smoke 1/4ppd Did not use nicotine patch as prescribed She plans to continue cutting down

## 2021-07-11 ENCOUNTER — Encounter: Payer: Self-pay | Admitting: Nurse Practitioner

## 2021-07-11 DIAGNOSIS — T753XXA Motion sickness, initial encounter: Secondary | ICD-10-CM

## 2021-07-16 ENCOUNTER — Other Ambulatory Visit (HOSPITAL_COMMUNITY): Payer: Self-pay

## 2021-07-16 MED ORDER — SCOPOLAMINE 1 MG/3DAYS TD PT72
1.0000 | MEDICATED_PATCH | TRANSDERMAL | 0 refills | Status: DC
  Filled 2021-07-16: qty 10, 30d supply, fill #0

## 2021-07-16 MED ORDER — AMOXICILLIN 500 MG PO CAPS
ORAL_CAPSULE | ORAL | 0 refills | Status: DC
  Filled 2021-07-16: qty 21, 7d supply, fill #0

## 2021-07-27 ENCOUNTER — Other Ambulatory Visit: Payer: Self-pay | Admitting: Nurse Practitioner

## 2021-07-27 ENCOUNTER — Other Ambulatory Visit (HOSPITAL_COMMUNITY): Payer: Self-pay

## 2021-07-27 DIAGNOSIS — R7303 Prediabetes: Secondary | ICD-10-CM

## 2021-08-04 ENCOUNTER — Other Ambulatory Visit (HOSPITAL_COMMUNITY): Payer: Self-pay

## 2021-08-05 ENCOUNTER — Other Ambulatory Visit (HOSPITAL_COMMUNITY): Payer: Self-pay

## 2021-08-05 MED ORDER — UNIFINE PENTIPS 31G X 6 MM MISC
1.0000 "application " | Freq: Every day | 0 refills | Status: DC
  Filled 2021-08-05: qty 100, 90d supply, fill #0

## 2021-08-06 ENCOUNTER — Other Ambulatory Visit (HOSPITAL_COMMUNITY): Payer: Self-pay

## 2021-08-14 ENCOUNTER — Other Ambulatory Visit (HOSPITAL_COMMUNITY): Payer: Self-pay

## 2021-09-03 ENCOUNTER — Other Ambulatory Visit: Payer: Self-pay | Admitting: Nurse Practitioner

## 2021-09-03 ENCOUNTER — Other Ambulatory Visit (HOSPITAL_COMMUNITY): Payer: Self-pay

## 2021-09-03 DIAGNOSIS — Z6841 Body Mass Index (BMI) 40.0 and over, adult: Secondary | ICD-10-CM

## 2021-09-03 DIAGNOSIS — R7303 Prediabetes: Secondary | ICD-10-CM

## 2021-09-03 MED ORDER — SAXENDA 18 MG/3ML ~~LOC~~ SOPN
PEN_INJECTOR | SUBCUTANEOUS | 1 refills | Status: DC
  Filled 2021-09-03: qty 15, 40d supply, fill #0

## 2021-09-03 NOTE — Telephone Encounter (Signed)
Chart supports Rx Last seen 06/2021 Next OV 09/2021

## 2021-09-13 ENCOUNTER — Other Ambulatory Visit (HOSPITAL_COMMUNITY): Payer: Self-pay

## 2021-09-29 ENCOUNTER — Ambulatory Visit: Payer: No Typology Code available for payment source | Admitting: Nurse Practitioner

## 2021-09-29 ENCOUNTER — Telehealth: Payer: Self-pay | Admitting: Nurse Practitioner

## 2021-09-29 NOTE — Telephone Encounter (Signed)
Pt did not show for her OV with Baldo Ash on 09/29/21, I sent a no show letter.  ?

## 2021-10-07 ENCOUNTER — Encounter: Payer: Self-pay | Admitting: Nurse Practitioner

## 2021-10-07 NOTE — Telephone Encounter (Signed)
3rd no show, fee generated, please advise if ok to proceed with dismissal ?

## 2021-10-07 NOTE — Telephone Encounter (Signed)
Dismissal letter printed to mail out ?

## 2022-02-16 ENCOUNTER — Encounter (INDEPENDENT_AMBULATORY_CARE_PROVIDER_SITE_OTHER): Payer: Self-pay

## 2022-03-01 ENCOUNTER — Emergency Department (HOSPITAL_COMMUNITY): Payer: No Typology Code available for payment source

## 2022-03-01 ENCOUNTER — Encounter (HOSPITAL_COMMUNITY): Payer: Self-pay | Admitting: Emergency Medicine

## 2022-03-01 ENCOUNTER — Inpatient Hospital Stay (HOSPITAL_COMMUNITY)
Admission: EM | Admit: 2022-03-01 | Discharge: 2022-03-04 | DRG: 418 | Disposition: A | Discharge status: Home / Self Care | Payer: No Typology Code available for payment source | Attending: Internal Medicine | Admitting: Internal Medicine

## 2022-03-01 ENCOUNTER — Inpatient Hospital Stay (HOSPITAL_COMMUNITY): Payer: No Typology Code available for payment source

## 2022-03-01 ENCOUNTER — Other Ambulatory Visit: Payer: Self-pay

## 2022-03-01 DIAGNOSIS — K8050 Calculus of bile duct without cholangitis or cholecystitis without obstruction: Secondary | ICD-10-CM | POA: Diagnosis not present

## 2022-03-01 DIAGNOSIS — K8000 Calculus of gallbladder with acute cholecystitis without obstruction: Secondary | ICD-10-CM | POA: Diagnosis not present

## 2022-03-01 DIAGNOSIS — F32A Depression, unspecified: Secondary | ICD-10-CM | POA: Diagnosis present

## 2022-03-01 DIAGNOSIS — E559 Vitamin D deficiency, unspecified: Secondary | ICD-10-CM | POA: Diagnosis present

## 2022-03-01 DIAGNOSIS — Z794 Long term (current) use of insulin: Secondary | ICD-10-CM

## 2022-03-01 DIAGNOSIS — Z79899 Other long term (current) drug therapy: Secondary | ICD-10-CM | POA: Diagnosis not present

## 2022-03-01 DIAGNOSIS — Z6841 Body Mass Index (BMI) 40.0 and over, adult: Secondary | ICD-10-CM | POA: Diagnosis not present

## 2022-03-01 DIAGNOSIS — Z87891 Personal history of nicotine dependence: Secondary | ICD-10-CM | POA: Diagnosis not present

## 2022-03-01 DIAGNOSIS — K8062 Calculus of gallbladder and bile duct with acute cholecystitis without obstruction: Secondary | ICD-10-CM | POA: Diagnosis present

## 2022-03-01 DIAGNOSIS — K8020 Calculus of gallbladder without cholecystitis without obstruction: Secondary | ICD-10-CM | POA: Diagnosis not present

## 2022-03-01 DIAGNOSIS — R1011 Right upper quadrant pain: Principal | ICD-10-CM

## 2022-03-01 DIAGNOSIS — E538 Deficiency of other specified B group vitamins: Secondary | ICD-10-CM | POA: Diagnosis present

## 2022-03-01 DIAGNOSIS — R7989 Other specified abnormal findings of blood chemistry: Secondary | ICD-10-CM | POA: Diagnosis not present

## 2022-03-01 DIAGNOSIS — R112 Nausea with vomiting, unspecified: Secondary | ICD-10-CM

## 2022-03-01 HISTORY — DX: Obesity, unspecified: E66.9

## 2022-03-01 LAB — URINALYSIS, ROUTINE W REFLEX MICROSCOPIC
Bilirubin Urine: NEGATIVE
Glucose, UA: NEGATIVE mg/dL
Ketones, ur: NEGATIVE mg/dL
Leukocytes,Ua: NEGATIVE
Nitrite: NEGATIVE
Protein, ur: NEGATIVE mg/dL
Specific Gravity, Urine: 1.017 (ref 1.005–1.030)
pH: 7 (ref 5.0–8.0)

## 2022-03-01 LAB — HEPATITIS PANEL, ACUTE
HCV Ab: NONREACTIVE
Hep A IgM: NONREACTIVE
Hep B C IgM: NONREACTIVE
Hepatitis B Surface Ag: NONREACTIVE

## 2022-03-01 LAB — CBC WITH DIFFERENTIAL/PLATELET
Abs Immature Granulocytes: 0.02 10*3/uL (ref 0.00–0.07)
Basophils Absolute: 0 10*3/uL (ref 0.0–0.1)
Basophils Relative: 0 %
Eosinophils Absolute: 0 10*3/uL (ref 0.0–0.5)
Eosinophils Relative: 0 %
HCT: 38.6 % (ref 36.0–46.0)
Hemoglobin: 12.6 g/dL (ref 12.0–15.0)
Immature Granulocytes: 0 %
Lymphocytes Relative: 22 %
Lymphs Abs: 2.5 10*3/uL (ref 0.7–4.0)
MCH: 27 pg (ref 26.0–34.0)
MCHC: 32.6 g/dL (ref 30.0–36.0)
MCV: 82.8 fL (ref 80.0–100.0)
Monocytes Absolute: 0.8 10*3/uL (ref 0.1–1.0)
Monocytes Relative: 7 %
Neutro Abs: 7.9 10*3/uL — ABNORMAL HIGH (ref 1.7–7.7)
Neutrophils Relative %: 71 %
Platelets: 345 10*3/uL (ref 150–400)
RBC: 4.66 MIL/uL (ref 3.87–5.11)
RDW: 14.7 % (ref 11.5–15.5)
WBC: 11.2 10*3/uL — ABNORMAL HIGH (ref 4.0–10.5)
nRBC: 0 % (ref 0.0–0.2)

## 2022-03-01 LAB — COMPREHENSIVE METABOLIC PANEL
ALT: 203 U/L — ABNORMAL HIGH (ref 0–44)
AST: 371 U/L — ABNORMAL HIGH (ref 15–41)
Albumin: 3.5 g/dL (ref 3.5–5.0)
Alkaline Phosphatase: 119 U/L (ref 38–126)
Anion gap: 9 (ref 5–15)
BUN: 9 mg/dL (ref 6–20)
CO2: 24 mmol/L (ref 22–32)
Calcium: 9.6 mg/dL (ref 8.9–10.3)
Chloride: 105 mmol/L (ref 98–111)
Creatinine, Ser: 0.89 mg/dL (ref 0.44–1.00)
GFR, Estimated: >60 mL/min (ref >60)
Glucose, Bld: 128 mg/dL — ABNORMAL HIGH (ref 70–99)
Potassium: 4.2 mmol/L (ref 3.5–5.1)
Sodium: 138 mmol/L (ref 135–145)
Total Bilirubin: 0.8 mg/dL (ref 0.3–1.2)
Total Protein: 6.6 g/dL (ref 6.5–8.1)

## 2022-03-01 LAB — LIPASE, BLOOD: Lipase: 25 U/L (ref 11–51)

## 2022-03-01 LAB — I-STAT BETA HCG BLOOD, ED (MC, WL, AP ONLY): I-stat hCG, quantitative: <5 m[IU]/mL (ref <5)

## 2022-03-01 LAB — HIV ANTIBODY (ROUTINE TESTING W REFLEX): HIV Screen 4th Generation wRfx: NONREACTIVE

## 2022-03-01 MED ORDER — HYDRALAZINE HCL 20 MG/ML IJ SOLN: 10.0000 mg | INTRAMUSCULAR | Status: DC | PRN

## 2022-03-01 MED ORDER — LACTATED RINGERS IV SOLN
INTRAVENOUS | Status: DC
Start: 2022-03-01 — End: 2022-03-03

## 2022-03-01 MED ORDER — ACETAMINOPHEN 325 MG PO TABS: 650.0000 mg | ORAL_TABLET | Freq: Four times a day (QID) | ORAL | Status: DC | PRN

## 2022-03-01 MED ORDER — HYDROMORPHONE HCL 1 MG/ML IJ SOLN
1.0000 mg | Freq: Once | INTRAMUSCULAR | Status: AC
  Administered 2022-03-01: 1 mg via INTRAVENOUS
  Filled 2022-03-01: qty 1

## 2022-03-01 MED ORDER — BUPROPION HCL ER (SR) 100 MG PO TB12
200.0000 mg | ORAL_TABLET | Freq: Two times a day (BID) | ORAL | Status: DC
  Administered 2022-03-03: 200 mg via ORAL
  Filled 2022-03-01 (×9): qty 2

## 2022-03-01 MED ORDER — ONDANSETRON 4 MG PO TBDP
4.0000 mg | ORAL_TABLET | Freq: Four times a day (QID) | ORAL | Status: DC | PRN
  Administered 2022-03-01: 4 mg via ORAL
  Filled 2022-03-01: qty 1

## 2022-03-01 MED ORDER — MORPHINE SULFATE (PF) 4 MG/ML IV SOLN
4.0000 mg | Freq: Once | INTRAVENOUS | Status: AC
  Administered 2022-03-01: 4 mg via INTRAVENOUS
  Filled 2022-03-01: qty 1

## 2022-03-01 MED ORDER — ACETAMINOPHEN 650 MG RE SUPP: 650.0000 mg | Freq: Four times a day (QID) | RECTAL | Status: DC | PRN

## 2022-03-01 MED ORDER — ONDANSETRON HCL 4 MG/2ML IJ SOLN
4.0000 mg | Freq: Four times a day (QID) | INTRAMUSCULAR | Status: DC | PRN
  Administered 2022-03-01 – 2022-03-03 (×2): 4 mg via INTRAVENOUS
  Filled 2022-03-01 (×2): qty 2

## 2022-03-01 MED ORDER — CEFTRIAXONE SODIUM 2 G IJ SOLR
2.0000 g | INTRAMUSCULAR | Status: DC
  Administered 2022-03-01: 2 g via INTRAVENOUS
  Filled 2022-03-01: qty 20

## 2022-03-01 MED ORDER — HYDROMORPHONE HCL 1 MG/ML IJ SOLN
0.5000 mg | INTRAMUSCULAR | Status: DC | PRN
  Administered 2022-03-01 – 2022-03-02 (×7): 0.5 mg via INTRAVENOUS
  Filled 2022-03-01: qty 1
  Filled 2022-03-01 (×3): qty 0.5
  Filled 2022-03-01: qty 1
  Filled 2022-03-01: qty 0.5
  Filled 2022-03-01: qty 1

## 2022-03-01 MED ORDER — LORAZEPAM 2 MG/ML IJ SOLN
1.0000 mg | Freq: Once | INTRAMUSCULAR | Status: DC | PRN
Start: 2022-03-01 — End: 2022-03-02

## 2022-03-01 MED ORDER — GADOBUTROL 1 MMOL/ML IV SOLN
9.0000 mL | Freq: Once | INTRAVENOUS | Status: AC | PRN
  Administered 2022-03-01: 9 mL via INTRAVENOUS

## 2022-03-01 MED ORDER — OXYCODONE HCL 5 MG PO TABS
5.0000 mg | ORAL_TABLET | ORAL | Status: DC | PRN
  Administered 2022-03-01: 10 mg via ORAL
  Filled 2022-03-01 (×2): qty 1

## 2022-03-01 MED ORDER — ONDANSETRON HCL 4 MG/2ML IJ SOLN
4.0000 mg | Freq: Once | INTRAMUSCULAR | Status: AC
  Administered 2022-03-01: 4 mg via INTRAVENOUS
  Filled 2022-03-01: qty 2

## 2022-03-01 MED ORDER — SODIUM CHLORIDE 0.9 % IV BOLUS
1000.0000 mL | Freq: Once | INTRAVENOUS | Status: AC
  Administered 2022-03-01: 1000 mL via INTRAVENOUS

## 2022-03-01 NOTE — ED Provider Triage Note (Signed)
Emergency Medicine Provider Triage Evaluation Note  Brenda Navarro , a 36 y.o. female  was evaluated in triage.  Pt complains of abdominal pain.  Over the past 2 weeks has had some intermittent pain in RUQ, more persistent today.  Some nausea/vomiting.  No fever.  No prior abdominal surgeries.  Review of Systems  Positive: Abdominal pain, N/V Negative: fever  Physical Exam  BP (!) 155/98 (BP Location: Right Arm)   Pulse 87   Temp 98.2 F (36.8 C) (Oral)   Resp 19   LMP 02/18/2022   SpO2 94%  Gen:   Awake, no distress   Resp:  Normal effort  MSK:   Moves extremities without difficulty  Other:  TTP RUQ  Medical Decision Making  Medically screening exam initiated at 2:33 AM.  Appropriate orders placed.  Juleen Sorrels was informed that the remainder of the evaluation will be completed by another provider, this initial triage assessment does not replace that evaluation, and the importance of remaining in the ED until their evaluation is complete.  RUQ pain, nausea/vomiting.  Will check labs, RUQ [place] to assess for gallstones or cholecystitis.   Garlon Hatchet, PA-C 03/01/22 [phone]

## 2022-03-01 NOTE — Progress Notes (Signed)
Pt understands POC for procedure 8/23.Marland Kitchen NPO at 12am

## 2022-03-01 NOTE — Consult Note (Signed)
[facility] Consult Note  Brenda Navarro 1985/07/19  [id number].    Requesting MD: Karmen Bongo Chief Complaint/Reason for Consult: RUQ pain  HPI:  Brenda Navarro is a 36 y.o. female who presented to [facility] today with worsening RUQ abdominal pain. States that she has had two similar episodes over the last month, but they were less severe and self limiting. This episode began around 1800 last night. Pain is constant and severe and radiates into her chest. Associated with multiple episodes of nausea and vomiting. Denies fever or chills. Due to persistent symptoms she decided to come to the ED. In the ED her BP was found to be elevated, otherwise afebrile and VSS. Lab work pertinent for WBC 11.2, elevated transaminases AST 371 and ALT 203, Alk phos 119, Tbili 0.8. RUQ u/s shows cholelithiasis without evidence of acute cholecystitis and mildly dilated common bile duct at 29m. General surgery asked to see.  Abdominal surgical history: IUD retrieval from uterine perforation Anticoagulants: none Quit smoking May 2023 Drinks alcohol occasionally Denies illicit drug use Employment: [employer] at [employer]  Adopted: Yes  Family history unknown: Yes    Past Medical History:  Diagnosis Date   Abnormal Pap smear    Back pain    Depression    Obesity    Uterine perforation by intrauterine contraceptive device 2017    Past Surgical History:  Procedure Laterality Date   ABDOMINAL SURGERY  2017   iud perforated uterus, had to remove   COLPOSCOPY  2011    Social History:  reports that she quit smoking about 3 months ago. Her smoking use included cigarettes. She has a 5.50 pack-year smoking history. She has never used smokeless tobacco. She reports current alcohol use. She reports that she does not use drugs.  Allergies: No Known Allergies  (Not in a hospital admission)   Prior to Admission medications   Medication Sig Start Date End Date Taking? Authorizing  Provider  buPROPion (WELLBUTRIN SR) 200 MG 12 hr tablet Take 1 tablet (200 mg total) by mouth 2 (two) times daily. 04/19/21 04/19/22  Nche, CCharlene Chyanna Flock NP  Insulin Pen Needle (UNIFINE PENTIPS) 31G X 6 MM MISC Use as directed with saxenda daily 08/05/21   Nche, CCharlene Monica Zahler NP  Liraglutide -Weight Management (SAXENDA) 18 MG/3ML SOPN Inject 2.427munder the skin daily 09/03/21   Nche, ChCharlene BrookeNP    Blood pressure 133/82, pulse 70, temperature 98.4 F (36.9 C), temperature source Oral, resp. rate 15, last menstrual period 02/18/2022, SpO2 100 %. Physical Exam: General: pleasant, WD/WN female who is laying in bed in NAD HEENT: head is normocephalic, atraumatic.  Sclera are noninjected.  Pupils equal and round.  Ears and nose without any masses or lesions.  Mouth is pink and moist. Dentition fair Heart: regular, rate, and rhythm.  Normal s1,s2. No obvious murmurs, gallops, or rubs noted.  Palpable radial and pedal pulses bilaterally  Lungs: CTAB, no wheezes, rhonchi, or rales noted.  Respiratory effort nonlabored Abd: soft, ND, +BS, no masses, hernias, or organomegaly. Focally tender in the RUQ without peritonitis  MS: no BUE/BLE edema, calves soft and nontender Skin: warm and dry with no masses, lesions, or rashes Psych: A&Ox4 with an appropriate affect Neuro: MAEs, no gross motor or sensory deficits BUE/BLE  Results for orders placed or performed during the hospital encounter of 03/01/22 (from the past 48 hour(s))  Urinalysis, Routine w reflex microscopic Urine, Clean Catch     Status: Abnormal  Collection Time: 03/01/22  2:10 AM  Result Value Ref Range   Color, Urine YELLOW YELLOW   APPearance CLOUDY (A) CLEAR   Specific Gravity, Urine 1.017 1.005 - 1.030   pH 7.0 5.0 - 8.0   Glucose, UA NEGATIVE NEGATIVE mg/dL   Hgb urine dipstick SMALL (A) NEGATIVE   Bilirubin Urine NEGATIVE NEGATIVE   Ketones, ur NEGATIVE NEGATIVE mg/dL   Protein, ur NEGATIVE NEGATIVE mg/dL   Nitrite  NEGATIVE NEGATIVE   Leukocytes,Ua NEGATIVE NEGATIVE   RBC / HPF 6-10 0 - 5 RBC/hpf   WBC, UA 0-5 0 - 5 WBC/hpf   Bacteria, UA RARE (A) NONE SEEN   Squamous Epithelial / LPF 0-5 0 - 5   Mucus PRESENT    Amorphous Crystal PRESENT     Comment: Performed at [facility], [place] [address] [place], [place] [address]  CBC with Differential     Status: Abnormal   Collection Time: 03/01/22  2:19 AM  Result Value Ref Range   WBC 11.2 (H) 4.0 - 10.5 K/uL   RBC 4.66 3.87 - 5.11 MIL/uL   Hemoglobin 12.6 12.0 - 15.0 g/dL   HCT 38.6 36.0 - 46.0 %   MCV 82.8 80.0 - 100.0 fL   MCH 27.0 26.0 - 34.0 pg   MCHC 32.6 30.0 - 36.0 g/dL   RDW 14.7 11.5 - 15.5 %   Platelets 345 150 - 400 K/uL   nRBC 0.0 0.0 - 0.2 %   Neutrophils Relative % 71 %   Neutro Abs 7.9 (H) 1.7 - 7.7 K/uL   Lymphocytes Relative 22 %   Lymphs Abs 2.5 0.7 - 4.0 K/uL   Monocytes Relative 7 %   Monocytes Absolute 0.8 0.1 - 1.0 K/uL   Eosinophils Relative 0 %   Eosinophils Absolute 0.0 0.0 - 0.5 K/uL   Basophils Relative 0 %   Basophils Absolute 0.0 0.0 - 0.1 K/uL   Immature Granulocytes 0 %   Abs Immature Granulocytes 0.02 0.00 - 0.07 K/uL    Comment: Performed at [facility] [address], [place], [place] [address]  Lipase, blood     Status: None   Collection Time: 03/01/22  2:19 AM  Result Value Ref Range   Lipase 25 11 - 51 U/L    Comment: Performed at [facility] [address] [place], [place] [address]  Comprehensive metabolic panel     Status: Abnormal   Collection Time: 03/01/22  2:19 AM  Result Value Ref Range   Sodium 138 135 - 145 mmol/L   Potassium 4.2 3.5 - 5.1 mmol/L   Chloride 105 98 - 111 mmol/L   CO2 24 22 - 32 mmol/L   Glucose, Bld 128 (H) 70 - 99 mg/dL    Comment: Glucose reference range applies only to samples taken after fasting for at least 8 hours.   BUN 9 6 - 20 mg/dL   Creatinine, Ser 0.89 0.44 - 1.00 mg/dL   Calcium 9.6 8.9 - 10.3 mg/dL   Total Protein 6.6 6.5 - 8.1 g/dL    Albumin 3.5 3.5 - 5.0 g/dL   AST 371 (H) 15 - 41 U/L   ALT 203 (H) 0 - 44 U/L   Alkaline Phosphatase 119 38 - 126 U/L   Total Bilirubin 0.8 0.3 - 1.2 mg/dL   GFR, Estimated >60 >60 mL/min    Comment: (NOTE) Calculated using the CKD-EPI Creatinine Equation (2021)    Anion gap 9 5 -  15    Comment: Performed at [facility], [place] [address], [place], [place] [address]  I-Stat Beta hCG blood, ED ([facility], [facility], [facility] only)     Status: None   Collection Time: 03/01/22  3:07 AM  Result Value Ref Range   I-stat hCG, quantitative <5.0 <5 mIU/mL   Comment 3            Comment:   GEST. AGE      CONC.  (mIU/mL)   <=1 WEEK        5 - 50     2 WEEKS       50 - 500     3 WEEKS       100 - 10,000     4 WEEKS     1,000 - 30,000        FEMALE AND NON-PREGNANT FEMALE:     LESS THAN 5 mIU/mL    US Abdomen Limited RUQ (LIVER/GB)  Result Date: 03/01/2022 CLINICAL DATA:  Right upper quadrant pain EXAM: ULTRASOUND ABDOMEN LIMITED RIGHT UPPER QUADRANT COMPARISON:  None Available. FINDINGS: Gallbladder: Multiple gallstones. No gallbladder wall thickening or pericholecystic fluid. A negative sonographic Percell Miller sign was reported by the sonographer. Common bile duct: Diameter: 7 mm Liver: No focal lesion identified. Within normal limits in parenchymal echogenicity. Portal vein is patent on color Doppler imaging with normal direction of blood flow towards the liver. Other: None. IMPRESSION: 1. Cholelithiasis without evidence of acute cholecystitis. 2. Mildly dilated common bile duct. Electronically Signed   By: Ulyses Jarred M.D.   On: 03/01/2022 02:48    Anti-infectives (From admission, onward)    None        Assessment/Plan RUQ abdominal pain, nausea, and vomiting Symptomatic cholelithiasis, possible early cholecystitis Elevated LFTs ?Choledocholithiasis - GI has been consulted and recommend MRCP to evaluate for possible choledocholithiasis. If this is negative I think we can proceed with laparoscopic  cholecystectomy with IOC. Keep NPO for now. Given she is still tender and has a mild leukocytosis I will start her on rocephin for possible early acute cholecystitis.   ID - rocephin VTE - SCDs, hold chemical dvt ppx in case she needs a procedure today FEN - IVF, NPO Foley - none  Depression Obesity  I reviewed hospitalist notes, last 24 h vitals and pain scores, last 48 h intake and output, last 24 h labs and trends, and last 24 h imaging results   Wellington Hampshire, [facility] 03/01/2022, 10:11 AM Please see Amion for pager number during day hours 7:00am-4:30pm

## 2022-03-01 NOTE — H&P (Signed)
History and Physical    Patient: Brenda Navarro IEP:[id number] DOB: [birth year] DOA: 03/01/2022 DOS: the patient was seen and examined on 03/01/2022 PCP: Alysia Penna, NP Patient coming from: Home - lives with boyfriend, and 3 children; NOK: Dianna Limbo, aunt, [phone]   Chief Complaint: abdominal pain  HPI: Brenda Navarro is a 36 y.o. female without significant medical history presenting with abdominal pain.  She reports that she developed sharp abdominal pains with n/v.  She has had prior episodes but this one didn't stop.  The last 2 episodes were maybe a couple of hours.  This episode started about 6 pm yesterday.  Pain is mid-epigastric and RUQ.  Last emesis was about 1AM.  The pain is still ongoing.  Last BM was yesterday and was normal.  No fever.  No sick contacts.  She does still have a gallbladder.    ER Course:  N/V and abdominal pain x 2 weeks.  LFTs elevated.  RUQ [place] with stones and CBD dilatation.  GI to consult.        Review of Systems: As mentioned in the history of present illness. All other systems reviewed and are negative. Past Medical History:  Diagnosis Date   Abnormal Pap smear    Back pain    Depression    Obesity    Uterine perforation by intrauterine contraceptive device 2017   Past Surgical History:  Procedure Laterality Date   ABDOMINAL SURGERY  2017   iud perforated uterus, had to remove   COLPOSCOPY  2011   Social History:  reports that she quit smoking about 3 months ago. Her smoking use included cigarettes. She has a 5.50 pack-year smoking history. She has never used smokeless tobacco. She reports current alcohol use. She reports that she does not use drugs.  No Known Allergies  Family History  Adopted: Yes  Family history unknown: Yes    Prior to Admission medications   Medication Sig Start Date End Date Taking? Authorizing Provider  amoxicillin (AMOXIL) 500 MG capsule Take 1 capsule by mouth 3 times a day until gone 07/16/21      buPROPion (WELLBUTRIN SR) 200 MG 12 hr tablet Take 1 tablet (200 mg total) by mouth 2 (two) times daily. 04/19/21 04/19/22  Nche, Bonna Gains, NP  Insulin Pen Needle (UNIFINE PENTIPS) 31G X 6 MM MISC Use as directed with saxenda daily 08/05/21   Nche, Bonna Gains, NP  Liraglutide -Weight Management (SAXENDA) 18 MG/3ML SOPN Inject 2.4mg  under the skin daily 09/03/21   Nche, Bonna Gains, NP  scopolamine (TRANSDERM-SCOP) 1 MG/3DAYS Place 1 patch (1.5 mg total) onto the skin behind the ear every 3 (three) days. 07/16/21   Anne Ng, NP    Physical Exam: Vitals:   03/01/22 0932 03/01/22 0521 03/01/22 0818  BP: (!) 155/98 (!) 145/87 133/82  Pulse: 87 77 70  Resp: 19 17 15   Temp: 98.2 F (36.8 C) 98.6 F (37 C) 98.4 F (36.9 C)  TempSrc: Oral  Oral  SpO2: 94% 98% 100%   General:  Appears calm and comfortable and is in NAD Eyes:  PERRL, EOMI, normal lids, iris ENT:  grossly normal hearing, lips & tongue, mmm; appropriate dentition Neck:  no LAD, masses or thyromegaly Cardiovascular:  RRR, no m/r/g. No LE edema.  Respiratory:   CTA bilaterally with no wheezes/rales/rhonchi.  Normal respiratory effort. Abdomen:  soft, RUQ < midepigastric TTP Skin:  no rash or induration seen on limited exam Musculoskeletal:  grossly normal tone BUE/BLE, good  ROM, no bony abnormality Psychiatric:  grossly normal mood and affect, speech fluent and appropriate, AOx3 Neurologic:  CN 2-12 grossly intact, moves all extremities in coordinated fashion   Radiological Exams on Admission: Independently reviewed - see discussion in A/P where applicable  US Abdomen Limited RUQ (LIVER/GB)  Result Date: 03/01/2022 CLINICAL DATA:  Right upper quadrant pain EXAM: ULTRASOUND ABDOMEN LIMITED RIGHT UPPER QUADRANT COMPARISON:  None Available. FINDINGS: Gallbladder: Multiple gallstones. No gallbladder wall thickening or pericholecystic fluid. A negative sonographic Eulah Pont sign was reported by the sonographer.  Common bile duct: Diameter: 7 mm Liver: No focal lesion identified. Within normal limits in parenchymal echogenicity. Portal vein is patent on color Doppler imaging with normal direction of blood flow towards the liver. Other: None. IMPRESSION: 1. Cholelithiasis without evidence of acute cholecystitis. 2. Mildly dilated common bile duct. Electronically Signed   By: Deatra Robinson M.D.   On: 03/01/2022 02:48    EKG: not done   Labs on Admission: I have personally reviewed the available labs and imaging studies at the time of the admission.  Pertinent labs:    Glucose 128 AST 371/ALT 203; normal in 04/2021 WBC 11.2 UA: small Hgb HCG <5   Assessment and Plan: Principal Problem:   Choledocholithiasis Active Problems:   Morbid obesity (HCC)    Choledocholithiasis -Patient with 2 prior transient episodes and now unremitting episode of RUQ with n/v  -Elevated LFTs on labs but normal bili, possible passed stone -RUQ [place] with cholelithiasis and mildly dilated CBD, no acute cholecystitis -She appears to need lap chole, possible ERCP vs. IOC -GI consulted and has requested MRCP (ordered) -Surgery consulted -Will keep NPO in case surgery is today -Oxy/Dilaudid for pain, Zofran for nausea -She does not appear to need antibiotics at this time  H/o uterine perforation from IUD -Patient reports that she is not currently on birth control because: 1- She is no longer an IUD candidate and prefers long-term birth control 2- She was told that she cannot get Nexplanon because she would also have a risk of displacement from this; this was incorrect guidance (I confirmed with OB Dr. Para March) and would encourage her to pursue that 3- Her PCP would not prescribe OCPs due to obesity and smoking; she is no longer smoking -Would encourage OCP rx at the time of hospital dc with plan for outpatient f/u for Nexplanon if desired by patient  Obesity -Patient is morbidly obese -Continue Wellbutrin -resume  liralugtide as an outpatient    Advance Care Planning:   Code Status: Full Code   Consults: Surgery; GI  DVT Prophylaxis: SCDs  Family Communication: None present; she is capable of communicating with family at this time  Severity of Illness: The appropriate patient status for this patient is INPATIENT. Inpatient status is judged to be reasonable and necessary in order to provide the required intensity of service to ensure the patient's safety. The patient's presenting symptoms, physical exam findings, and initial radiographic and laboratory data in the context of their chronic comorbidities is felt to place them at high risk for further clinical deterioration. Furthermore, it is not anticipated that the patient will be medically stable for discharge from the hospital within 2 midnights of admission.   * I certify that at the point of admission it is my clinical judgment that the patient will require inpatient hospital care spanning beyond 2 midnights from the point of admission due to high intensity of service, high risk for further deterioration and high frequency of surveillance required.*  Author: Jonah Blue, MD 03/01/2022 9:52 AM  For on call review [email or web address].

## 2022-03-01 NOTE — ED Triage Notes (Signed)
Patient reports RUQ abdominal pain onset yesterday with emesis , no fever or diarrhea .

## 2022-03-01 NOTE — Consult Note (Addendum)
Consultation  Referring Provider: Dr. Lorin Mercy    Primary Care Physician:  Pcp, No Primary Gastroenterologist: Althia Forts       Reason for Consultation: Elevated LFTs and abdominal pain         HPI:   Brenda Navarro is a 36 y.o. female with a past medical history of IUD perforation who presented to the ED with a chief complaint of abdominal pain for a few weeks.  We are consulted in regards to elevated LFTs and consideration for choledocholithiasis.      At time of arrival to the ED patient described a constant stabbing pain to the upper abdomen for the last couple of weeks with episodes that were intermittent and usually last a couple of hours however the episode the day before lasted longer.  Also with multiple episodes of nausea and vomiting unable to keep anything down since yesterday.    Today, the patient explains that she has had 3 episodes of right upper quadrant pain which seems to radiate up into her chest with associated nausea and occasional vomiting over the past month.  Describes a first was about a month ago and then 2 weeks ago and then yesterday.  The pain yesterday seem to last longer than normal with constant nausea and vomiting which is what brought her to the ER.  Since being here the pain is some better after Dilaudid and she has had no further vomiting.    Denies fever, chills or change in bowel habits.     ED course: AST 371, ALT 203, WBC 11.2, lipase normal, right upper quadrant ultrasound with cholelithiasis without evidence of acute cholecystitis and mildly dilated CBD to 7 mm  Past Medical History:  Diagnosis Date   Abnormal Pap smear    Back pain    Depression    Obesity    Uterine perforation by intrauterine contraceptive device 2017    Past Surgical History:  Procedure Laterality Date   ABDOMINAL SURGERY  2017   iud perforated uterus, had to remove   COLPOSCOPY  2011    Family History  Adopted: Yes  Family history unknown: Yes    Social History    Tobacco Use   Smoking status: Former    Packs/day: 0.50    Years: 11.00    Total pack years: 5.50    Types: Cigarettes    Quit date: 11/2021    Years since quitting: 0.3   Smokeless tobacco: Never  Vaping Use   Vaping Use: Never used  Substance Use Topics   Alcohol use: Yes    Comment: social   Drug use: No    Prior to Admission medications   Medication Sig Start Date End Date Taking? Authorizing Provider  buPROPion (WELLBUTRIN SR) 200 MG 12 hr tablet Take 1 tablet (200 mg total) by mouth 2 (two) times daily. 04/19/21 04/19/22  Nche, Charlene Brooke, NP  Insulin Pen Needle (UNIFINE PENTIPS) 31G X 6 MM MISC Use as directed with saxenda daily 08/05/21   Nche, Charlene Brooke, NP  Liraglutide -Weight Management (SAXENDA) 18 MG/3ML SOPN Inject 2.22m under the skin daily 09/03/21   Nche, CCharlene Brooke NP    Current Facility-Administered Medications  Medication Dose Route Frequency Provider Last Rate Last Admin   acetaminophen (TYLENOL) tablet 650 mg  650 mg Oral Q6H PRN YKarmen Bongo MD       Or   acetaminophen (TYLENOL) suppository 650 mg  650 mg Rectal Q6H PRN YKarmen Bongo MD  buPROPion ER (WELLBUTRIN SR) 12 hr tablet 200 mg  200 mg Oral BID Karmen Bongo, MD       hydrALAZINE (APRESOLINE) injection 10 mg  10 mg Intravenous Q2H PRN Karmen Bongo, MD       HYDROmorphone (DILAUDID) injection 0.5 mg  0.5 mg Intravenous Q2H PRN Karmen Bongo, MD       lactated ringers infusion   Intravenous Continuous Karmen Bongo, MD       ondansetron (ZOFRAN-ODT) disintegrating tablet 4 mg  4 mg Oral Q6H PRN Karmen Bongo, MD       Or   ondansetron [facility]) injection 4 mg  4 mg Intravenous Q6H PRN Karmen Bongo, MD       oxyCODONE (Oxy IR/ROXICODONE) immediate release tablet 5-10 mg  5-10 mg Oral Q4H PRN Karmen Bongo, MD       Current Outpatient Medications  Medication Sig Dispense Refill   buPROPion (WELLBUTRIN SR) 200 MG 12 hr tablet Take 1 tablet (200 mg total) by  mouth 2 (two) times daily. 60 tablet 5   Insulin Pen Needle (UNIFINE PENTIPS) 31G X 6 MM MISC Use as directed with saxenda daily 100 each 0   Liraglutide -Weight Management (SAXENDA) 18 MG/3ML SOPN Inject 2.64m under the skin daily 15 mL 1    Allergies as of 03/01/2022   (No Known Allergies)     Review of Systems:    Constitutional: No weight loss, fever or chills Skin: No rash  Cardiovascular: No chest pain Respiratory: No SOB Gastrointestinal: See HPI and otherwise negative Genitourinary: No dysuria  Neurological: No headache Musculoskeletal: No new muscle or joint pain Hematologic: No bleeding  Psychiatric: No history of depression or anxiety    Physical Exam:  Vital signs in last 24 hours: Temp:  [98.2 F (36.8 C)-98.6 F (37 C)] 98.4 F (36.9 C) (08/22 0818) Pulse Rate:  [70-87] 70 (08/22 0818) Resp:  [15-19] 15 (08/22 0818) BP: (133-155)/(82-98) 133/82 (08/22 0818) SpO2:  [94 %-100 %] 100 % (08/22 0818)   General:   Pleasant Overweight AA female appears to be in NAD, Well developed, Well nourished, alert and cooperative Head:  Normocephalic and atraumatic. Eyes:   PEERL, EOMI. No icterus. Conjunctiva pink. Ears:  Normal auditory acuity. Neck:  Supple Throat: Oral cavity and pharynx without inflammation, swelling or lesion.  Lungs: Respirations even and unlabored. Lungs clear to auscultation bilaterally.   No wheezes, crackles, or rhonchi.  Heart: Normal S1, S2. No MRG. Regular rate and rhythm. No peripheral edema, cyanosis or pallor.  Abdomen:  Soft, nondistended, marked RUQ ttp. No rebound or guarding. Normal bowel sounds. No appreciable masses or hepatomegaly. Rectal:  Not performed.  Msk:  Symmetrical without gross deformities. Peripheral pulses intact.  Extremities:  Without edema, no deformity or joint abnormality.  Neurologic:  Alert and  oriented x4;  grossly normal neurologically.  Skin:   Dry and intact without significant lesions or rashes. Psychiatric:  Demonstrates good judgement and reason without abnormal affect or behaviors.   LAB RESULTS: Recent Labs    03/01/22 0219  WBC 11.2*  HGB 12.6  HCT 38.6  PLT 345   BMET Recent Labs    03/01/22 0219  NA 138  K 4.2  CL 105  CO2 24  GLUCOSE 128*  BUN 9  CREATININE 0.89  CALCIUM 9.6      Latest Ref Rng & Units 03/01/2022    2:19 AM 04/19/2021    9:35 AM 02/20/2020    1:43 PM  Hepatic Function  Total Protein 6.5 - 8.1 g/dL 6.6  6.6  6.6   Albumin 3.5 - 5.0 g/dL 3.5  3.9  4.2   AST 15 - 41 U/L 371  10  11   ALT 0 - 44 U/L 203  9  9   Alk Phosphatase 38 - 126 U/L 119  89  93   Total Bilirubin 0.3 - 1.2 mg/dL 0.8  0.2  0.4      STUDIES: US Abdomen Limited RUQ (LIVER/GB)  Result Date: 03/01/2022 CLINICAL DATA:  Right upper quadrant pain EXAM: ULTRASOUND ABDOMEN LIMITED RIGHT UPPER QUADRANT COMPARISON:  None Available. FINDINGS: Gallbladder: Multiple gallstones. No gallbladder wall thickening or pericholecystic fluid. A negative sonographic Percell Miller sign was reported by the sonographer. Common bile duct: Diameter: 7 mm Liver: No focal lesion identified. Within normal limits in parenchymal echogenicity. Portal vein is patent on color Doppler imaging with normal direction of blood flow towards the liver. Other: None. IMPRESSION: 1. Cholelithiasis without evidence of acute cholecystitis. 2. Mildly dilated common bile duct. Electronically Signed   By: Ulyses Jarred M.D.   On: 03/01/2022 02:48     Impression / Plan:   Impression: 1.  Abdominal pain with nausea and vomiting: 3 episodes of right upper quadrant pain with nausea and vomiting over the past month, last episode yesterday which was longer than normal and has continued into today, ultrasound with cholelithiasis and mildly dilated CBD; likely symptomatic cholelithiasis +/- choledocholithiasis 2.  Elevated LFTs: AST 371, ALT 203, ultrasound with cholelithiasis and mildly dilated CBD; concern for choledocholithiasis  Plan: 1.   Awaiting results from MRCP 2.  Pending results from above we will need to consider possible ERCP, went ahead and discussed this procedure with her including risks.  She agrees to proceed if needed. 3.  Patient to remain n.p.o. for now 4.  Continue to monitor LFTs 5.  Agree with as needed analgesics and antiemetics  Thank you for your kind consultation, we will continue to follow.  Lavone Nian [facility]  03/01/2022, 9:41 AM    Attending Physician Note   I have taken a history, reviewed the chart and examined the patient. I performed a substantive portion of this encounter, including complete performance of at least one of the key components, in conjunction with the APP. I agree with the APP's note, impression and recommendations with my edits. My additional impressions and recommendations are as follows.   *Symptomatic cholelithiasis. Agree with plans for lap chole +/- IOC.   *Elevated LFTs. MRCP images personally reviewed - gallstones, mild CBD dilation at 7 mm and no CBD stones. May have passed a CBD stone. Trend LFTs.    GI signing off. We are available if needed.   Lucio Edward, MD [facility] See [facility], [facility], for our on call provider

## 2022-03-01 NOTE — ED Provider Notes (Signed)
[facility] EMERGENCY DEPARTMENT Provider Note   CSN: [id number] Arrival date & time: 03/01/22  0202     History  Chief Complaint  Patient presents with   Abdominal Pain    Gallstones   Emesis    Brenda Navarro is a 36 y.o. female.  36 y.o female with a PMH of IUD perforation presents to the ED with a chief complaint of abdominal pain x few weeks. Patient endorses a constant stabbing pain to the upper abdomen for the last couple of weeks, episodes are intermittent usually lasting a couple of hours however yesterdays episode lasted long. She reports ongoing pain since 6pm last night. No alleviating or exacerbating factors. She also endorses multiple episodes or nausea and vomiting and unable to keep anything down since yesterday. She has not taken any medication to help with her pain. Last oral intake around noon yesterday. No alcohol abuse. No fever, no urinary symptoms, no diarrhea.     The history is provided by the patient and medical records.  Abdominal Pain Pain location:  RUQ Pain quality: sharp and stabbing   Pain radiates to:  Does not radiate Pain severity:  Moderate Onset quality:  Gradual Duration:  2 weeks Timing:  Intermittent Progression:  Worsening Chronicity:  New Context: not eating, not medication withdrawal, not recent illness and not suspicious food intake   Relieved by:  Nothing Ineffective treatments:  None tried Associated symptoms: nausea and vomiting   Associated symptoms: no chest pain, no chills, no fever, no shortness of breath and no sore throat   Risk factors: no alcohol abuse, no NSAID use, not obese and not pregnant   Emesis Associated symptoms: abdominal pain   Associated symptoms: no chills, no fever, no headaches and no sore throat        Home Medications Prior to Admission medications   Medication Sig Start Date End Date Taking? Authorizing Provider  amoxicillin (AMOXIL) 500 MG capsule Take 1 capsule by mouth 3  times a day until gone 07/16/21     buPROPion (WELLBUTRIN SR) 200 MG 12 hr tablet Take 1 tablet (200 mg total) by mouth 2 (two) times daily. 04/19/21 04/19/22  Nche, Bonna Gains, NP  Insulin Pen Needle (UNIFINE PENTIPS) 31G X 6 MM MISC Use as directed with saxenda daily 08/05/21   Nche, Bonna Gains, NP  Liraglutide -Weight Management (SAXENDA) 18 MG/3ML SOPN Inject 2.4mg  under the skin daily 09/03/21   Nche, Bonna Gains, NP  scopolamine (TRANSDERM-SCOP) 1 MG/3DAYS Place 1 patch (1.5 mg total) onto the skin behind the ear every 3 (three) days. 07/16/21   Nche, Bonna Gains, NP      Allergies    Patient has no known allergies.    Review of Systems   Review of Systems  Constitutional:  Negative for chills and fever.  HENT:  Negative for sore throat.   Respiratory:  Negative for shortness of breath.   Cardiovascular:  Negative for chest pain.  Gastrointestinal:  Positive for abdominal pain, nausea and vomiting.  Genitourinary:  Negative for flank pain.  Musculoskeletal:  Negative for back pain.  Neurological:  Negative for light-headedness and headaches.  All other systems reviewed and are negative.   Physical Exam Updated Vital Signs BP 133/82 (BP Location: Right Arm)   Pulse 70   Temp 98.4 F (36.9 C) (Oral)   Resp 15   LMP 02/18/2022   SpO2 100%  Physical Exam Vitals and nursing note reviewed.  Constitutional:  Appearance: She is well-developed.  HENT:     Head: Normocephalic and atraumatic.  Cardiovascular:     Rate and Rhythm: Normal rate.  Pulmonary:     Effort: Pulmonary effort is normal.     Breath sounds: No wheezing.  Abdominal:     General: Abdomen is flat.     Palpations: Abdomen is soft.     Tenderness: There is abdominal tenderness in the right upper quadrant. There is no right CVA tenderness, left CVA tenderness or guarding.  Skin:    General: Skin is warm and dry.  Neurological:     Mental Status: She is alert.     ED Results / Procedures /  Treatments   Labs (all labs ordered are listed, but only abnormal results are displayed) Labs Reviewed  CBC WITH DIFFERENTIAL/PLATELET - Abnormal; Notable for the following components:      Result Value   WBC 11.2 (*)    Neutro Abs 7.9 (*)    All other components within normal limits  COMPREHENSIVE METABOLIC PANEL - Abnormal; Notable for the following components:   Glucose, Bld 128 (*)    AST 371 (*)    ALT 203 (*)    All other components within normal limits  URINALYSIS, ROUTINE W REFLEX MICROSCOPIC - Abnormal; Notable for the following components:   APPearance CLOUDY (*)    Hgb urine dipstick SMALL (*)    Bacteria, UA RARE (*)    All other components within normal limits  LIPASE, BLOOD  I-STAT BETA HCG BLOOD, ED ([facility], [facility], [facility] ONLY)    EKG None  Radiology US Abdomen Limited RUQ (LIVER/GB)  Result Date: 03/01/2022 CLINICAL DATA:  Right upper quadrant pain EXAM: ULTRASOUND ABDOMEN LIMITED RIGHT UPPER QUADRANT COMPARISON:  None Available. FINDINGS: Gallbladder: Multiple gallstones. No gallbladder wall thickening or pericholecystic fluid. A negative sonographic Eulah Pont sign was reported by the sonographer. Common bile duct: Diameter: 7 mm Liver: No focal lesion identified. Within normal limits in parenchymal echogenicity. Portal vein is patent on color Doppler imaging with normal direction of blood flow towards the liver. Other: None. IMPRESSION: 1. Cholelithiasis without evidence of acute cholecystitis. 2. Mildly dilated common bile duct. Electronically Signed   By: Deatra Robinson M.D.   On: 03/01/2022 02:48    Procedures Procedures    Medications Ordered in ED Medications  morphine (PF) 4 MG/ML injection 4 mg (4 mg Intravenous Given 03/01/22 0724)  ondansetron (ZOFRAN) injection 4 mg (4 mg Intravenous Given 03/01/22 0724)  sodium chloride 0.9 % bolus 1,000 mL (1,000 mLs Intravenous New Bag/Given 03/01/22 0730)  HYDROmorphone (DILAUDID) injection 1 mg (1 mg Intravenous Given 03/01/22  0825)    ED Course/ Medical Decision Making/ A&P Clinical Course as of 03/01/22 0855  Tue Mar 01, 2022  0826 AST(!): 371 [JS]  0827 ALT(!): 203 [JS]    Clinical Course User Index [JS] Claude Manges, PA-C                           Medical Decision Making Amount and/or Complexity of Data Reviewed Labs:  Decision-making details documented in ED Course.  Risk Prescription drug management.   This patient presents to the ED for concern of nausea and vomiting and upper abdominal pain this involves a number of treatment options, and is a complaint that carries with it a high risk of complications and morbidity.  The differential diagnosis includes    Co morbidities: Discussed in HPI   Brief History:  Healthy 36 year old female with no previous medical history here with right upper quadrant abdominal pain for the last 2 weeks, exacerbated last night with episode lasting since 6 PM.  Endorsing nausea, vomiting and decrease in p.o. oral intake.  Has not taken anything for pain control as she reports "high pain tolerance ".  No fevers, no urinary symptoms.  No prior surgical history of her abdomen.  EMR reviewed including pt PMHx, past surgical history and past visits to ER.   See HPI for more details   Lab Tests:  I ordered and independently interpreted labs.  The pertinent results include:    Labs notable for elevated Lfts AST 371/ALT 203, creatine level is within normal limits. No electrolyte derangement. CBC within normal limits. Lipase level is normal. UA with no signs of infection. Beta HCG is negative.    Imaging Studies:  RUQ US showed: 1. Cholelithiasis without evidence of acute cholecystitis.  2. Mildly dilated common bile duct.   Medicines ordered:  I ordered medication including morphine, zofran  for symptomatic control Reevaluation of the patient after these medicines showed that the patient stayed the same I have reviewed the patients home medicines and have  made adjustments as needed  -No improvement yet after pain medication continues to rate pain as 8/10, given dilaudid for pain control.   Consults:  I requested consultation with [facility],  and discussed lab and imaging findings as well as pertinent plan - they recommend: ERCP for better visualization of common bile duct.   Reevaluation:  After the interventions noted above I re-evaluated patient and found that they have :stayed the same   Social Determinants of Health:  The patient's social determinants of health were a factor in the care of this patient    Problem List / ED Course:  Patient here with nausea, vomiting and upper abdominal pain x few weeks. Labs are benign aside from elevated LFTs in the upper 300's, no known hx of alcohol use. Ultrasound abdomen is markable for dilated common bile duct, given pain medication while in the ED. patient reassessed after pain medication with no improvement in symptoms.  I spoke to [facility] who recommended patient need ERCP.  I did give patient an extra round of Dilaudid for pain control.  I discussed care with hospitalist Dr. Ophelia Charter, appreciate her assistance.  Patient will be brought in for the cholelithiasis.   Dispostion:  After consideration of the diagnostic results and the patients response to treatment, I feel that the patent would benefit from patient for further intervention for her right upper quadrant pain.   Portions of this note were generated with Scientist, clinical (histocompatibility and immunogenetics). Dictation errors may occur despite best attempts at proofreading.  Final Clinical Impression(s) / ED Diagnoses Final diagnoses:  Right upper quadrant pain  Nausea and vomiting, unspecified vomiting type    Rx / DC Orders ED Discharge Orders     None         Claude Manges, PA-C 03/01/22 0856    Sabas Sous, MD 03/02/22 [phone]

## 2022-03-02 ENCOUNTER — Inpatient Hospital Stay (HOSPITAL_COMMUNITY): Payer: No Typology Code available for payment source | Admitting: Anesthesiology

## 2022-03-02 ENCOUNTER — Other Ambulatory Visit: Payer: Self-pay

## 2022-03-02 ENCOUNTER — Encounter (HOSPITAL_COMMUNITY): Admission: EM | Disposition: A | Discharge status: Home / Self Care | Payer: Self-pay | Source: Home / Self Care

## 2022-03-02 ENCOUNTER — Inpatient Hospital Stay (HOSPITAL_COMMUNITY): Payer: No Typology Code available for payment source

## 2022-03-02 ENCOUNTER — Encounter (HOSPITAL_COMMUNITY): Payer: Self-pay | Admitting: Internal Medicine

## 2022-03-02 DIAGNOSIS — K8000 Calculus of gallbladder with acute cholecystitis without obstruction: Secondary | ICD-10-CM | POA: Diagnosis not present

## 2022-03-02 DIAGNOSIS — K8050 Calculus of bile duct without cholangitis or cholecystitis without obstruction: Secondary | ICD-10-CM | POA: Diagnosis not present

## 2022-03-02 HISTORY — PX: CHOLECYSTECTOMY: SHX55

## 2022-03-02 LAB — COMPREHENSIVE METABOLIC PANEL
ALT: 290 U/L — ABNORMAL HIGH (ref 0–44)
AST: 170 U/L — ABNORMAL HIGH (ref 15–41)
Albumin: 3 g/dL — ABNORMAL LOW (ref 3.5–5.0)
Alkaline Phosphatase: 131 U/L — ABNORMAL HIGH (ref 38–126)
Anion gap: 7 (ref 5–15)
BUN: 6 mg/dL (ref 6–20)
CO2: 26 mmol/L (ref 22–32)
Calcium: 8.7 mg/dL — ABNORMAL LOW (ref 8.9–10.3)
Chloride: 106 mmol/L (ref 98–111)
Creatinine, Ser: 0.9 mg/dL (ref 0.44–1.00)
GFR, Estimated: >60 mL/min (ref >60)
Glucose, Bld: 99 mg/dL (ref 70–99)
Potassium: 3.7 mmol/L (ref 3.5–5.1)
Sodium: 139 mmol/L (ref 135–145)
Total Bilirubin: 1 mg/dL (ref 0.3–1.2)
Total Protein: 6 g/dL — ABNORMAL LOW (ref 6.5–8.1)

## 2022-03-02 LAB — CBC
HCT: 35.9 % — ABNORMAL LOW (ref 36.0–46.0)
Hemoglobin: 11.4 g/dL — ABNORMAL LOW (ref 12.0–15.0)
MCH: 26.6 pg (ref 26.0–34.0)
MCHC: 31.8 g/dL (ref 30.0–36.0)
MCV: 83.7 fL (ref 80.0–100.0)
Platelets: 330 10*3/uL (ref 150–400)
RBC: 4.29 MIL/uL (ref 3.87–5.11)
RDW: 14.9 % (ref 11.5–15.5)
WBC: 6.9 10*3/uL (ref 4.0–10.5)
nRBC: 0 % (ref 0.0–0.2)

## 2022-03-02 SURGERY — LAPAROSCOPIC CHOLECYSTECTOMY WITH INTRAOPERATIVE CHOLANGIOGRAM
Anesthesia: General | Site: Abdomen

## 2022-03-02 MED ORDER — METHOCARBAMOL 1000 MG/10ML IJ SOLN
500.0000 mg | Freq: Four times a day (QID) | INTRAVENOUS | Status: DC | PRN
  Administered 2022-03-02 – 2022-03-03 (×2): 500 mg via INTRAVENOUS
  Filled 2022-03-02 (×2): qty 500

## 2022-03-02 MED ORDER — AMISULPRIDE (ANTIEMETIC) 5 MG/2ML IV SOLN: 10.0000 mg | Freq: Once | INTRAVENOUS | Status: DC | PRN

## 2022-03-02 MED ORDER — 0.9 % SODIUM CHLORIDE (POUR BTL) OPTIME
TOPICAL | Status: DC | PRN
  Administered 2022-03-02: 1000 mL

## 2022-03-02 MED ORDER — INDOCYANINE GREEN 25 MG IV SOLR
INTRAVENOUS | Status: DC | PRN
  Administered 2022-03-02: 2.5 mg via INTRAVENOUS

## 2022-03-02 MED ORDER — BUPIVACAINE-EPINEPHRINE (PF) 0.25% -1:200000 IJ SOLN
INTRAMUSCULAR | Status: AC
  Filled 2022-03-02: qty 30

## 2022-03-02 MED ORDER — LACTATED RINGERS IV SOLN: INTRAVENOUS | Status: DC

## 2022-03-02 MED ORDER — BUPIVACAINE-EPINEPHRINE 0.25% -1:200000 IJ SOLN
INTRAMUSCULAR | Status: DC | PRN
  Administered 2022-03-02: 10 mL

## 2022-03-02 MED ORDER — ORAL CARE MOUTH RINSE
15.0000 mL | Freq: Once | OROMUCOSAL | Status: AC
Start: 2022-03-02 — End: 2022-03-02

## 2022-03-02 MED ORDER — PHENYLEPHRINE 80 MCG/ML (10ML) SYRINGE FOR IV PUSH (FOR BLOOD PRESSURE SUPPORT)
PREFILLED_SYRINGE | INTRAVENOUS | Status: AC
  Filled 2022-03-02: qty 20

## 2022-03-02 MED ORDER — SODIUM CHLORIDE 0.9 % IR SOLN
Status: DC | PRN
  Administered 2022-03-02: 1000 mL

## 2022-03-02 MED ORDER — FENTANYL CITRATE (PF) 250 MCG/5ML IJ SOLN
INTRAMUSCULAR | Status: DC | PRN
Start: 2022-03-02 — End: 2022-03-02
  Administered 2022-03-02: 100 ug via INTRAVENOUS
  Administered 2022-03-02 (×3): 50 ug via INTRAVENOUS

## 2022-03-02 MED ORDER — KETOROLAC TROMETHAMINE 30 MG/ML IJ SOLN
INTRAMUSCULAR | Status: AC
  Filled 2022-03-02: qty 1

## 2022-03-02 MED ORDER — KETOROLAC TROMETHAMINE 30 MG/ML IJ SOLN
INTRAMUSCULAR | Status: DC | PRN
  Administered 2022-03-02: 30 mg via INTRAVENOUS

## 2022-03-02 MED ORDER — IOHEXOL 300 MG/ML  SOLN: INTRAMUSCULAR | Status: DC | PRN

## 2022-03-02 MED ORDER — ONDANSETRON HCL 4 MG/2ML IJ SOLN
INTRAMUSCULAR | Status: AC
  Filled 2022-03-02: qty 4

## 2022-03-02 MED ORDER — FENTANYL CITRATE (PF) 250 MCG/5ML IJ SOLN
INTRAMUSCULAR | Status: AC
  Filled 2022-03-02: qty 5

## 2022-03-02 MED ORDER — CHLORHEXIDINE GLUCONATE 0.12 % MT SOLN
OROMUCOSAL | Status: AC
  Administered 2022-03-02: 15 mL via OROMUCOSAL
  Filled 2022-03-02: qty 15

## 2022-03-02 MED ORDER — ACETAMINOPHEN 500 MG PO TABS
ORAL_TABLET | ORAL | Status: AC
  Filled 2022-03-02: qty 2

## 2022-03-02 MED ORDER — HYDROMORPHONE HCL 1 MG/ML IJ SOLN
0.5000 mg | INTRAMUSCULAR | Status: DC | PRN
  Administered 2022-03-02 – 2022-03-03 (×5): 0.5 mg via INTRAVENOUS
  Filled 2022-03-02 (×5): qty 0.5

## 2022-03-02 MED ORDER — SCOPOLAMINE 1 MG/3DAYS TD PT72
MEDICATED_PATCH | TRANSDERMAL | Status: AC
  Filled 2022-03-02: qty 1

## 2022-03-02 MED ORDER — GLYCOPYRROLATE PF 0.2 MG/ML IJ SOSY
PREFILLED_SYRINGE | INTRAMUSCULAR | Status: AC
  Filled 2022-03-02: qty 1

## 2022-03-02 MED ORDER — CHLORHEXIDINE GLUCONATE 0.12 % MT SOLN
15.0000 mL | Freq: Once | OROMUCOSAL | Status: AC
Start: 2022-03-02 — End: 2022-03-02

## 2022-03-02 MED ORDER — ONDANSETRON HCL 4 MG/2ML IJ SOLN
INTRAMUSCULAR | Status: DC | PRN
  Administered 2022-03-02: 4 mg via INTRAVENOUS

## 2022-03-02 MED ORDER — HYDROMORPHONE HCL 1 MG/ML IJ SOLN
0.5000 mg | INTRAMUSCULAR | Status: DC | PRN
  Administered 2022-03-02 (×2): 0.5 mg via INTRAVENOUS

## 2022-03-02 MED ORDER — SUGAMMADEX SODIUM 200 MG/2ML IV SOLN
INTRAVENOUS | Status: DC | PRN
  Administered 2022-03-02: 200 mg via INTRAVENOUS

## 2022-03-02 MED ORDER — LIDOCAINE 2% (20 MG/ML) 5 ML SYRINGE
INTRAMUSCULAR | Status: DC | PRN
  Administered 2022-03-02: 60 mg via INTRAVENOUS

## 2022-03-02 MED ORDER — SCOPOLAMINE 1 MG/3DAYS TD PT72
MEDICATED_PATCH | TRANSDERMAL | Status: DC | PRN
  Administered 2022-03-02: 1 via TRANSDERMAL

## 2022-03-02 MED ORDER — DEXAMETHASONE SODIUM PHOSPHATE 10 MG/ML IJ SOLN
INTRAMUSCULAR | Status: DC | PRN
  Administered 2022-03-02: 8 mg via INTRAVENOUS

## 2022-03-02 MED ORDER — FENTANYL CITRATE (PF) 100 MCG/2ML IJ SOLN
25.0000 ug | INTRAMUSCULAR | Status: DC | PRN
  Administered 2022-03-02 (×2): 50 ug via INTRAVENOUS

## 2022-03-02 MED ORDER — ROCURONIUM BROMIDE 10 MG/ML (PF) SYRINGE
PREFILLED_SYRINGE | INTRAVENOUS | Status: DC | PRN
  Administered 2022-03-02: 60 mg via INTRAVENOUS
  Administered 2022-03-02 (×2): 10 mg via INTRAVENOUS

## 2022-03-02 MED ORDER — HYDROMORPHONE HCL 1 MG/ML IJ SOLN
INTRAMUSCULAR | Status: AC
  Filled 2022-03-02: qty 1

## 2022-03-02 MED ORDER — PHENYLEPHRINE 80 MCG/ML (10ML) SYRINGE FOR IV PUSH (FOR BLOOD PRESSURE SUPPORT)
PREFILLED_SYRINGE | INTRAVENOUS | Status: DC | PRN
  Administered 2022-03-02 (×2): 80 ug via INTRAVENOUS

## 2022-03-02 MED ORDER — PROPOFOL 10 MG/ML IV BOLUS
INTRAVENOUS | Status: AC
  Filled 2022-03-02: qty 20

## 2022-03-02 MED ORDER — OXYCODONE HCL 5 MG PO TABS
5.0000 mg | ORAL_TABLET | ORAL | Status: DC | PRN
  Administered 2022-03-02 (×2): 10 mg via ORAL
  Administered 2022-03-03: 5 mg via ORAL
  Administered 2022-03-03 – 2022-03-04 (×6): 10 mg via ORAL
  Filled 2022-03-02: qty 1
  Filled 2022-03-02 (×8): qty 2

## 2022-03-02 MED ORDER — PROPOFOL 10 MG/ML IV BOLUS
INTRAVENOUS | Status: DC | PRN
  Administered 2022-03-02: 30 mg via INTRAVENOUS
  Administered 2022-03-02: 250 mg via INTRAVENOUS

## 2022-03-02 MED ORDER — MIDAZOLAM HCL 2 MG/2ML IJ SOLN
INTRAMUSCULAR | Status: AC
  Filled 2022-03-02: qty 2

## 2022-03-02 MED ORDER — ACETAMINOPHEN 500 MG PO TABS
1000.0000 mg | ORAL_TABLET | Freq: Once | ORAL | Status: AC
Start: 2022-03-02 — End: 2022-03-02
  Administered 2022-03-02: 1000 mg via ORAL

## 2022-03-02 MED ORDER — DEXAMETHASONE SODIUM PHOSPHATE 10 MG/ML IJ SOLN
INTRAMUSCULAR | Status: AC
  Filled 2022-03-02: qty 2

## 2022-03-02 MED ORDER — MIDAZOLAM HCL 2 MG/2ML IJ SOLN
INTRAMUSCULAR | Status: DC | PRN
  Administered 2022-03-02: 2 mg via INTRAVENOUS

## 2022-03-02 MED ORDER — FENTANYL CITRATE (PF) 100 MCG/2ML IJ SOLN
INTRAMUSCULAR | Status: AC
  Filled 2022-03-02: qty 2

## 2022-03-02 SURGICAL SUPPLY — 46 items
ADH SKN CLS APL DERMABOND .7 (GAUZE/BANDAGES/DRESSINGS) ×1
APL PRP STRL LF DISP 70% ISPRP (MISCELLANEOUS) ×1
APPLIER CLIP 5 13 M/L LIGAMAX5 (MISCELLANEOUS) ×1
APR CLP MED LRG 5 ANG JAW (MISCELLANEOUS) ×1
BAG COUNTER SPONGE SURGICOUNT (BAG) ×1 IMPLANT
BAG SPEC RTRVL 10 TROC 200 (ENDOMECHANICALS) ×1
BAG SPNG CNTER NS LX DISP (BAG) ×1
CANISTER SUCT 3000ML PPV (MISCELLANEOUS) ×1 IMPLANT
CHLORAPREP W/TINT 26 (MISCELLANEOUS) ×1 IMPLANT
CLIP APPLIE 5 13 M/L LIGAMAX5 (MISCELLANEOUS) ×1 IMPLANT
COVER MAYO STAND STRL (DRAPES) ×1 IMPLANT
COVER SURGICAL LIGHT HANDLE (MISCELLANEOUS) ×1 IMPLANT
DERMABOND ADVANCED (GAUZE/BANDAGES/DRESSINGS) ×1
DERMABOND ADVANCED .7 DNX12 (GAUZE/BANDAGES/DRESSINGS) IMPLANT
DRAPE C-ARM 42X120 X-RAY (DRAPES) ×1 IMPLANT
DRSG TEGADERM 2-3/8X2-3/4 SM (GAUZE/BANDAGES/DRESSINGS) IMPLANT
ELECT REM PT RETURN 9FT ADLT (ELECTROSURGICAL) ×1
ELECTRODE REM PT RTRN 9FT ADLT (ELECTROSURGICAL) ×1 IMPLANT
GAUZE SPONGE 2X2 8PLY STRL LF (GAUZE/BANDAGES/DRESSINGS) ×1 IMPLANT
GLOVE BIOGEL M STRL SZ7.5 (GLOVE) ×1 IMPLANT
GLOVE INDICATOR 8.0 STRL GRN (GLOVE) ×2 IMPLANT
GOWN STRL REUS W/ TWL LRG LVL3 (GOWN DISPOSABLE) ×3 IMPLANT
GOWN STRL REUS W/TWL 2XL LVL3 (GOWN DISPOSABLE) ×1 IMPLANT
GOWN STRL REUS W/TWL LRG LVL3 (GOWN DISPOSABLE) ×2
GRASPER SUT TROCAR 14GX15 (MISCELLANEOUS) ×1 IMPLANT
KIT BASIN OR (CUSTOM PROCEDURE TRAY) ×1 IMPLANT
KIT TURNOVER KIT B (KITS) ×1 IMPLANT
NS IRRIG 1000ML POUR BTL (IV SOLUTION) ×1 IMPLANT
PAD ARMBOARD 7.5X6 YLW CONV (MISCELLANEOUS) ×1 IMPLANT
POUCH RETRIEVAL ECOSAC 10 (ENDOMECHANICALS) ×1 IMPLANT
POUCH RETRIEVAL ECOSAC 10MM (ENDOMECHANICALS) ×1
SCISSORS LAP 5X35 DISP (ENDOMECHANICALS) ×1 IMPLANT
SET CHOLANGIOGRAPH 5 50 .035 (SET/KITS/TRAYS/PACK) IMPLANT
SET IRRIG TUBING LAPAROSCOPIC (IRRIGATION / IRRIGATOR) ×1 IMPLANT
SET TUBE SMOKE EVAC HIGH FLOW (TUBING) ×1 IMPLANT
SLEEVE Z-THREAD 5X100MM (TROCAR) IMPLANT
SPECIMEN JAR SMALL (MISCELLANEOUS) ×1 IMPLANT
STRIP CLOSURE SKIN 1/2X4 (GAUZE/BANDAGES/DRESSINGS) IMPLANT
SUT MNCRL AB 4-0 PS2 18 (SUTURE) ×1 IMPLANT
SUT VICRYL 0 TIES 12 18 (SUTURE) IMPLANT
TOWEL GREEN STERILE (TOWEL DISPOSABLE) ×1 IMPLANT
TRAY LAPAROSCOPIC MC (CUSTOM PROCEDURE TRAY) ×1 IMPLANT
TROCAR OPTI BLUNT TIP 12M 100M (ENDOMECHANICALS) IMPLANT
TROCAR Z THREAD OPTICAL 12X100 (TROCAR) IMPLANT
TROCAR Z-THREAD OPTICAL 5X100M (TROCAR) IMPLANT
WATER STERILE IRR 1000ML POUR (IV SOLUTION) ×1 IMPLANT

## 2022-03-02 NOTE — Progress Notes (Addendum)
PROGRESS NOTE        PATIENT DETAILS Name: Brenda Navarro Age: 36 y.o. Sex: female Date of Birth: 18-Jan-1986 Admit Date: 03/01/2022 Admitting Physician Jonah Blue, MD PCP:Pcp, No  Brief Summary: Patient is a 36 y.o.  female with no past medical hx presented with abdominal pain-she was found to have elevated transaminases with cholelithaisis-and was thought to have passed a CBD stone   Significant events: 8/22> admit to [facility]-abd pain-cholelithiasis  Significant studies: 8/22>>RUQ EHM:[id number], mildly dilated CBD 8/22>>MRCP:no choledocholithiasis  Significant microbiology data:   Procedures:   Consults: GI [facility]  Subjective: Mild RUQ Pain-but comfortable  Objective: Vitals: Blood pressure 127/75, pulse 76, temperature 97.9 F (36.6 C), temperature source Oral, resp. rate 16, last menstrual period 02/18/2022, SpO2 100 %.   Exam: Gen Exam:Alert awake-not in any distress HEENT:atraumatic, normocephalic Chest: B/L clear to auscultation anteriorly CVS:S1S2 regular Abdomen:soft-mildly tender in RUQ-no peritoneal signs Extremities:no edema Neurology: Non focal Skin: no rash  Pertinent Labs/Radiology:    Latest Ref Rng & Units 03/02/2022    3:30 AM 03/01/2022    2:19 AM 02/20/2020    1:43 PM  CBC  WBC 4.0 - 10.5 K/uL 6.9  11.2  7.7   Hemoglobin 12.0 - 15.0 g/dL 47.6  54.6  50.3   Hematocrit 36.0 - 46.0 % 35.9  38.6  40.5   Platelets 150 - 400 K/uL 330  345  319     Lab Results  Component Value Date   NA 139 03/02/2022   K 3.7 03/02/2022   CL 106 03/02/2022   CO2 26 03/02/2022      Assessment/Plan: Symptomatic Cholelithiasis: minimal abd pain this am-for lap cholecystectomy today. May have passed a CBD stone-MRCP neg for choledocholithiasis. 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  Morbid Obesity: Estimated body mass index is 41.14 kg/m as calculated from the  following:   Height as of 06/30/21: 5' 3.75" (1.619 m).   Weight as of 06/30/21: 107.9 kg.   Code status:   Code Status: Full Code   DVT Prophylaxis: SCDs Start: 03/01/22 0935   Family Communication: None at bedside   Disposition Plan: Status is: Inpatient Remains inpatient appropriate because: cholecystectomy scheduled later today   Planned Discharge Destination:Home likely 8/24   Diet: Diet Order             Diet NPO time specified  Diet effective midnight                     Antimicrobial agents: Anti-infectives (From admission, onward)    Start     Dose/Rate Route Frequency Ordered Stop   03/01/22 1230  cefTRIAXone (ROCEPHIN) 2 g in sodium chloride 0.9 % 100 mL IVPB        2 g 200 mL/hr over 30 Minutes Intravenous Every 24 hours 03/01/22 1150          MEDICATIONS: Scheduled Meds:  buPROPion  200 mg Oral BID   Continuous Infusions:  cefTRIAXone (ROCEPHIN)  IV Stopped (03/01/22 1448)   lactated ringers 75 mL/hr at 03/01/22 0954   PRN Meds:.acetaminophen **OR** acetaminophen, hydrALAZINE, HYDROmorphone (DILAUDID) injection, LORazepam, ondansetron **OR** ondansetron (ZOFRAN) IV, oxyCODONE   I have personally reviewed following labs and imaging studies  LABORATORY DATA: CBC: Recent Labs  Lab 03/01/22 0219 03/02/22 0330  WBC 11.2* 6.9  NEUTROABS 7.9*  --   HGB 12.6 11.4*  HCT 38.6 35.9*  MCV 82.8 83.7  PLT 345 330    Basic Metabolic Panel: Recent Labs  Lab 03/01/22 0219 03/02/22 0330  NA 138 139  K 4.2 3.7  CL 105 106  CO2 24 26  GLUCOSE 128* 99  BUN 9 6  CREATININE 0.89 0.90  CALCIUM 9.6 8.7*    GFR: CrCl cannot be calculated (Unknown ideal weight.).  Liver Function Tests: Recent Labs  Lab 03/01/22 0219 03/02/22 0330  AST 371* 170*  ALT 203* 290*  ALKPHOS 119 131*  BILITOT 0.8 1.0  PROT 6.6 6.0*  ALBUMIN 3.5 3.0*   Recent Labs  Lab 03/01/22 0219  LIPASE 25   No results for input(s): "AMMONIA" in the last 168  hours.  Coagulation Profile: No results for input(s): "INR", "PROTIME" in the last 168 hours.  Cardiac Enzymes: No results for input(s): "CKTOTAL", "CKMB", "CKMBINDEX", "TROPONINI" in the last 168 hours.  BNP (last 3 results) No results for input(s): "PROBNP" in the last 8760 hours.  Lipid Profile: No results for input(s): "CHOL", "HDL", "LDLCALC", "TRIG", "CHOLHDL", "LDLDIRECT" in the last 72 hours.  Thyroid Function Tests: No results for input(s): "TSH", "T4TOTAL", "FREET4", "T3FREE", "THYROIDAB" in the last 72 hours.  Anemia Panel: No results for input(s): "VITAMINB12", "FOLATE", "FERRITIN", "TIBC", "IRON", "RETICCTPCT" in the last 72 hours.  Urine analysis:    Component Value Date/Time   COLORURINE YELLOW 03/01/2022 0210   APPEARANCEUR CLOUDY (A) 03/01/2022 0210   LABSPEC 1.017 03/01/2022 0210   PHURINE 7.0 03/01/2022 0210   GLUCOSEU NEGATIVE 03/01/2022 0210   HGBUR SMALL (A) 03/01/2022 0210   BILIRUBINUR NEGATIVE 03/01/2022 0210   BILIRUBINUR Negative 02/25/2021 0954   KETONESUR NEGATIVE 03/01/2022 0210   PROTEINUR NEGATIVE 03/01/2022 0210   UROBILINOGEN 0.2 02/25/2021 0954   UROBILINOGEN 2.0 (H) 12/08/2012 2144   NITRITE NEGATIVE 03/01/2022 0210   LEUKOCYTESUR NEGATIVE 03/01/2022 0210    Sepsis Labs: Lactic Acid, Venous No results found for: "LATICACIDVEN"  MICROBIOLOGY: No results found for this or any previous visit (from the past 240 hour(s)).  RADIOLOGY STUDIES/RESULTS: MR ABDOMEN MRCP W WO CONTAST  Result Date: 03/01/2022 CLINICAL DATA:  Abdominal pain times a few weeks.  Cholelithiasis. EXAM: MRI ABDOMEN WITHOUT AND WITH CONTRAST (INCLUDING MRCP) TECHNIQUE: Multiplanar multisequence MR imaging of the abdomen was performed both before and after the administration of intravenous contrast. Heavily T2-weighted images of the biliary and pancreatic ducts were obtained, and three-dimensional MRCP images were rendered by post processing. CONTRAST:  22mL GADAVIST  GADOBUTROL 1 MMOL/ML IV SOLN COMPARISON:  Abdominal ultrasound dated March 01, 2022. FINDINGS: Lower chest: No acute abnormality. Hepatobiliary: No significant hepatic steatosis. No suspicious hepatic lesion. Mild periportal edema. Cholelithiasis without findings of acute cholecystitis. No biliary ductal dilation the common bile duct measures 4-5 mm. No choledocholithiasis. Pancreas: Intrinsic T1 signal of the pancreatic parenchyma is within normal limits. No pancreatic ductal dilation. No pancreatic divisum. No cystic or solid hyperenhancing pancreatic lesion identified. Spleen:  No splenomegaly or focal splenic lesion. Adrenals/Urinary Tract: No masses identified. No evidence of hydronephrosis. Stomach/Bowel: Visualized portions within the abdomen are unremarkable. Vascular/Lymphatic: No pathologically enlarged lymph nodes identified. No abdominal aortic aneurysm demonstrated. Other:  None. Musculoskeletal: No suspicious bone lesions identified. IMPRESSION: 1. Cholelithiasis without MRI findings of acute cholecystitis. 2. No biliary ductal dilation or choledocholithiasis. 3. Mild periportal edema is nonspecific but most commonly seen in the setting of hepatitis or abundant intravenous  rehydration. Electronically Signed   By: Maudry Mayhew M.D.   On: 03/01/2022 11:57   MR 3D Recon At Scanner  Result Date: 03/01/2022 CLINICAL DATA:  Abdominal pain times a few weeks.  Cholelithiasis. EXAM: MRI ABDOMEN WITHOUT AND WITH CONTRAST (INCLUDING MRCP) TECHNIQUE: Multiplanar multisequence MR imaging of the abdomen was performed both before and after the administration of intravenous contrast. Heavily T2-weighted images of the biliary and pancreatic ducts were obtained, and three-dimensional MRCP images were rendered by post processing. CONTRAST:  52mL GADAVIST GADOBUTROL 1 MMOL/ML IV SOLN COMPARISON:  Abdominal ultrasound dated March 01, 2022. FINDINGS: Lower chest: No acute abnormality. Hepatobiliary: No significant  hepatic steatosis. No suspicious hepatic lesion. Mild periportal edema. Cholelithiasis without findings of acute cholecystitis. No biliary ductal dilation the common bile duct measures 4-5 mm. No choledocholithiasis. Pancreas: Intrinsic T1 signal of the pancreatic parenchyma is within normal limits. No pancreatic ductal dilation. No pancreatic divisum. No cystic or solid hyperenhancing pancreatic lesion identified. Spleen:  No splenomegaly or focal splenic lesion. Adrenals/Urinary Tract: No masses identified. No evidence of hydronephrosis. Stomach/Bowel: Visualized portions within the abdomen are unremarkable. Vascular/Lymphatic: No pathologically enlarged lymph nodes identified. No abdominal aortic aneurysm demonstrated. Other:  None. Musculoskeletal: No suspicious bone lesions identified. IMPRESSION: 1. Cholelithiasis without MRI findings of acute cholecystitis. 2. No biliary ductal dilation or choledocholithiasis. 3. Mild periportal edema is nonspecific but most commonly seen in the setting of hepatitis or abundant intravenous rehydration. Electronically Signed   By: Maudry Mayhew M.D.   On: 03/01/2022 11:57   US Abdomen Limited RUQ (LIVER/GB)  Result Date: 03/01/2022 CLINICAL DATA:  Right upper quadrant pain EXAM: ULTRASOUND ABDOMEN LIMITED RIGHT UPPER QUADRANT COMPARISON:  None Available. FINDINGS: Gallbladder: Multiple gallstones. No gallbladder wall thickening or pericholecystic fluid. A negative sonographic Eulah Pont sign was reported by the sonographer. Common bile duct: Diameter: 7 mm Liver: No focal lesion identified. Within normal limits in parenchymal echogenicity. Portal vein is patent on color Doppler imaging with normal direction of blood flow towards the liver. Other: None. IMPRESSION: 1. Cholelithiasis without evidence of acute cholecystitis. 2. Mildly dilated common bile duct. Electronically Signed   By: Deatra Robinson M.D.   On: 03/01/2022 02:48     LOS: 1 day   Jeoffrey Massed, MD  [facility]    To contact the attending provider between 7A-7P or the covering provider during after hours 7P-7A, please log into the web site [email or web address] and access using universal [place] password for that web site. If you do not have the password, please call the hospital operator.  03/02/2022, 7:16 AM

## 2022-03-02 NOTE — Op Note (Signed)
Brenda Navarro [id number] 29-Jul-1985 03/02/2022  Laparoscopic Cholecystectomy with ICG dye immunofluorescence and ATTEMPTED IOC Procedure Note  Indications: This patient presents with symptomatic gallbladder disease and will undergo laparoscopic cholecystectomy. She also had a preoperative MRCP which revealed no evidence of choledocholithiasis.  Her preoperative ultrasound in the emergency room showed a mildly dilated common bile duct of 7 mm.  GI had wanted a preoperative MRCP  Pre-operative Diagnosis: symptomatic cholelithiasis  Post-operative Diagnosis: acute calculous cholecystitis  Surgeon: Gaynelle Adu MD FACS  Assistants: none  Anesthesia: General endotracheal anesthesia  Procedure Details  The patient was seen again in the Holding Room. The risks, benefits, complications, treatment options, and expected outcomes were discussed with the patient. The possibilities of reaction to medication, pulmonary aspiration, perforation of viscus, bleeding, recurrent infection, finding a normal gallbladder, the need for additional procedures, failure to diagnose a condition, the possible need to convert to an open procedure, and creating a complication requiring transfusion or operation were discussed with the patient. The likelihood of improving the patient's symptoms with return to their baseline status is good.  The patient and/or family concurred with the proposed plan, giving informed consent. The site of surgery properly noted. The patient was taken to Operating Room, identified as Brenda Navarro and the procedure verified as Laparoscopic Cholecystectomy with Intraoperative Cholangiogram. A Time Out was held and the above information confirmed. Antibiotic prophylaxis was administered.  Patient was given ICG dye preoperatively.    Prior to the induction of general anesthesia, antibiotic prophylaxis was administered. General endotracheal anesthesia was then administered and tolerated well. After the  induction, the abdomen was prepped with Chloraprep and draped in the sterile fashion. The patient was positioned in the supine position.  Because of her obesity I elected to gain access to the abdomen using the Optiview technique.  A small incision was made in the left upper quadrant slightly off the midline just below the subcostal margin.  Then using a 0 degree 5 mm laparoscope through a 5 mm trocar I advanced it through all layers of the abdominal wall and carefully entered the abdominal cavity.  Pneumoperitoneum was smoothly established up to a patient pressure of 15 mmHg without any change in patient vital signs.  The abdominal cavity was surveilled.  There was no evidence of injury to surrounding viscera.  The patient was placed in reverse Trendelenburg and rotated to the left.  A supraumbilical 12 mm trocar was placed under direct visualization   Two 5-mm ports were placed in the right upper quadrant. All skin incisions were infiltrated with a local anesthetic agent before making the incision and placing the trocars.   Patient had a few adhesions from the liver to the abdominal wall underneath the rib cage consistent with Brenda Navarro.  The gallbladder was distended and I ended up aspirating it in order to facilitate retraction.  The gallbladder was identified, the fundus grasped and retracted cephalad. Adhesions were lysed bluntly and with the electrocautery where indicated, taking care not to injure any adjacent organs or viscus. The infundibulum was grasped and retracted laterally, exposing the peritoneum overlying the triangle of Calot. This was then divided and exposed in a blunt fashion. A critical view of the cystic duct and cystic artery was obtained.  The cystic duct was clearly identified and bluntly dissected circumferentially. The cystic duct was ligated with a clip distally.  Patient had both an anterior and posterior cystic artery both going into the gallbladder.  I ended up taking  down the anterior  branch of the cystic artery in order to get around the cystic duct easily.  Near infrared immunofluorescence was activated and I can visualize near infrared immunofluorescence within the liver, the main bile duct and faint amount in the cystic duct.  An incision was made in the cystic duct and the [facility] cholangiogram catheter was attempted to be introduced.  She had a stone in her cystic duct and I was able to milk it out.  I then tried several attempts to place the [facility] cholangiogram catheter into the cystic duct but was unable to thread it.  There was flow of bile out of the duct automate site.  But I just could not thread the catheter.  I decided to abort the cholangiogram portion of the procedure.  The catheter was removed  The cystic duct was then ligated with clips and divided. The cystic artery (both the anterior and posterior branches ) which had been identified & dissected free was ligated with clips and divided as well.   The gallbladder was dissected from the liver bed in retrograde fashion with the electrocautery. The gallbladder was removed and placed in an Ecco sac.  The liver bed was irrigated and inspected. Hemostasis was achieved with the electrocautery. Copious irrigation was utilized and was repeatedly aspirated until clear.  The gallbladder and Ecco sac were then removed through the umbilical port site.   The umbilical fascial defect was closed with 4 interrupted 0 Vicryl sutures using a PMI with suture passer. No air leak.   We again inspected the right upper quadrant for hemostasis.  The umbilical closure was inspected and there was no air leak and nothing trapped within the closure. Pneumoperitoneum was released as we removed the trocars.  4-0 Monocryl was used to close the skin.   steri-strips, and clean dressings were applied. The patient was then extubated and brought to the recovery room in stable condition. Instrument, sponge, and needle counts were correct at  closure and at the conclusion of the case.   Findings: Acute Cholecystitis with Cholelithiasis  Estimated Blood Loss: Minimal         Drains: none         Specimens: Gallbladder           Complications: None; patient tolerated the procedure well.         Disposition: PACU - hemodynamically stable.         Condition: stable  Mary Sella. Andrey Campanile, MD, FACS General, Bariatric, & Minimally Invasive Surgery [facility], [place]

## 2022-03-02 NOTE — Progress Notes (Signed)
Day of Surgery  Subjective: Still with some RUQ abdominal pain.    ROS: See above, otherwise other systems negative  Objective: Vital signs in last 24 hours: Temp:  [97.6 F (36.4 C)-98.7 F (37.1 C)] 98.6 F (37 C) (08/23 0757) Pulse Rate:  [67-76] 72 (08/23 0757) Resp:  [16-18] 18 (08/23 0757) BP: (114-132)/(55-75) 122/70 (08/23 0757) SpO2:  [98 %-100 %] 100 % (08/23 0757)    Intake/Output from previous day: 08/22 0701 - 08/23 0700 In: 412.7 [P.O.:240; I.V.:72.7; IV Piggyback:100] Out: -  Intake/Output this shift: Total I/O In: 1101.3 [I.V.:1101.3] Out: -   PE: Gen: NAD Pulm: respiratory effort NL  Lab Results:  Recent Labs    03/01/22 0219 03/02/22 0330  WBC 11.2* 6.9  HGB 12.6 11.4*  HCT 38.6 35.9*  PLT 345 330   BMET Recent Labs    03/01/22 0219 03/02/22 0330  NA 138 139  K 4.2 3.7  CL 105 106  CO2 24 26  GLUCOSE 128* 99  BUN 9 6  CREATININE 0.89 0.90  CALCIUM 9.6 8.7*   PT/INR No results for input(s): "LABPROT", "INR" in the last 72 hours. CMP     Component Value Date/Time   NA 139 03/02/2022 0330   NA 141 02/20/2020 1343   K 3.7 03/02/2022 0330   CL 106 03/02/2022 0330   CO2 26 03/02/2022 0330   GLUCOSE 99 03/02/2022 0330   BUN 6 03/02/2022 0330   BUN 9 02/20/2020 1343   CREATININE 0.90 03/02/2022 0330   CALCIUM 8.7 (L) 03/02/2022 0330   PROT 6.0 (L) 03/02/2022 0330   PROT 6.6 02/20/2020 1343   ALBUMIN 3.0 (L) 03/02/2022 0330   ALBUMIN 4.2 02/20/2020 1343   AST 170 (H) 03/02/2022 0330   ALT 290 (H) 03/02/2022 0330   ALKPHOS 131 (H) 03/02/2022 0330   BILITOT 1.0 03/02/2022 0330   BILITOT 0.4 02/20/2020 1343   GFRNONAA >60 03/02/2022 0330   GFRAA 130 02/20/2020 1343   Lipase     Component Value Date/Time   LIPASE 25 03/01/2022 0219       Studies/Results: MR ABDOMEN MRCP W WO CONTAST  Result Date: 03/01/2022 CLINICAL DATA:  Abdominal pain times a few weeks.  Cholelithiasis. EXAM: MRI ABDOMEN WITHOUT AND WITH  CONTRAST (INCLUDING MRCP) TECHNIQUE: Multiplanar multisequence MR imaging of the abdomen was performed both before and after the administration of intravenous contrast. Heavily T2-weighted images of the biliary and pancreatic ducts were obtained, and three-dimensional MRCP images were rendered by post processing. CONTRAST:  53mL GADAVIST GADOBUTROL 1 MMOL/ML IV SOLN COMPARISON:  Abdominal ultrasound dated March 01, 2022. FINDINGS: Lower chest: No acute abnormality. Hepatobiliary: No significant hepatic steatosis. No suspicious hepatic lesion. Mild periportal edema. Cholelithiasis without findings of acute cholecystitis. No biliary ductal dilation the common bile duct measures 4-5 mm. No choledocholithiasis. Pancreas: Intrinsic T1 signal of the pancreatic parenchyma is within normal limits. No pancreatic ductal dilation. No pancreatic divisum. No cystic or solid hyperenhancing pancreatic lesion identified. Spleen:  No splenomegaly or focal splenic lesion. Adrenals/Urinary Tract: No masses identified. No evidence of hydronephrosis. Stomach/Bowel: Visualized portions within the abdomen are unremarkable. Vascular/Lymphatic: No pathologically enlarged lymph nodes identified. No abdominal aortic aneurysm demonstrated. Other:  None. Musculoskeletal: No suspicious bone lesions identified. IMPRESSION: 1. Cholelithiasis without MRI findings of acute cholecystitis. 2. No biliary ductal dilation or choledocholithiasis. 3. Mild periportal edema is nonspecific but most commonly seen in the setting of hepatitis or abundant intravenous rehydration. Electronically Signed   By:  Maudry Mayhew M.D.   On: 03/01/2022 11:57   MR 3D Recon At Scanner  Result Date: 03/01/2022 CLINICAL DATA:  Abdominal pain times a few weeks.  Cholelithiasis. EXAM: MRI ABDOMEN WITHOUT AND WITH CONTRAST (INCLUDING MRCP) TECHNIQUE: Multiplanar multisequence MR imaging of the abdomen was performed both before and after the administration of intravenous  contrast. Heavily T2-weighted images of the biliary and pancreatic ducts were obtained, and three-dimensional MRCP images were rendered by post processing. CONTRAST:  5mL GADAVIST GADOBUTROL 1 MMOL/ML IV SOLN COMPARISON:  Abdominal ultrasound dated March 01, 2022. FINDINGS: Lower chest: No acute abnormality. Hepatobiliary: No significant hepatic steatosis. No suspicious hepatic lesion. Mild periportal edema. Cholelithiasis without findings of acute cholecystitis. No biliary ductal dilation the common bile duct measures 4-5 mm. No choledocholithiasis. Pancreas: Intrinsic T1 signal of the pancreatic parenchyma is within normal limits. No pancreatic ductal dilation. No pancreatic divisum. No cystic or solid hyperenhancing pancreatic lesion identified. Spleen:  No splenomegaly or focal splenic lesion. Adrenals/Urinary Tract: No masses identified. No evidence of hydronephrosis. Stomach/Bowel: Visualized portions within the abdomen are unremarkable. Vascular/Lymphatic: No pathologically enlarged lymph nodes identified. No abdominal aortic aneurysm demonstrated. Other:  None. Musculoskeletal: No suspicious bone lesions identified. IMPRESSION: 1. Cholelithiasis without MRI findings of acute cholecystitis. 2. No biliary ductal dilation or choledocholithiasis. 3. Mild periportal edema is nonspecific but most commonly seen in the setting of hepatitis or abundant intravenous rehydration. Electronically Signed   By: Maudry Mayhew M.D.   On: 03/01/2022 11:57   US Abdomen Limited RUQ (LIVER/GB)  Result Date: 03/01/2022 CLINICAL DATA:  Right upper quadrant pain EXAM: ULTRASOUND ABDOMEN LIMITED RIGHT UPPER QUADRANT COMPARISON:  None Available. FINDINGS: Gallbladder: Multiple gallstones. No gallbladder wall thickening or pericholecystic fluid. A negative sonographic Eulah Pont sign was reported by the sonographer. Common bile duct: Diameter: 7 mm Liver: No focal lesion identified. Within normal limits in parenchymal echogenicity.  Portal vein is patent on color Doppler imaging with normal direction of blood flow towards the liver. Other: None. IMPRESSION: 1. Cholelithiasis without evidence of acute cholecystitis. 2. Mildly dilated common bile duct. Electronically Signed   By: Deatra Robinson M.D.   On: 03/01/2022 02:48    Anti-infectives: Anti-infectives (From admission, onward)    Start     Dose/Rate Route Frequency Ordered Stop   03/01/22 1230  cefTRIAXone (ROCEPHIN) 2 g in sodium chloride 0.9 % 100 mL IVPB        2 g 200 mL/hr over 30 Minutes Intravenous Every 24 hours 03/01/22 1150          Assessment/Plan Calculous Cholecystitis   -MRCP and hep panel negative -LFTs slightly up today -plan for lap chole with possible IOC today -I have explained the procedure, risks, and aftercare of cholecystectomy.  Risks include but are not limited to bleeding, infection, wound problems, anesthesia, diarrhea, bile leak, injury to common bile duct/liver/intestine.  She seems to understand and agrees to proceed. -discussed possibility of home later today vs tomorrow  FEN - NPO/IVFs VTE - ok post op to start chemical prophylaxis ID - Rocephin  I reviewed Consultant GI notes, hospitalist notes, last 24 h vitals and pain scores, last 48 h intake and output, last 24 h labs and trends, and last 24 h imaging results.   LOS: 1 day    Letha Cape , [facility] 03/02/2022, 8:59 AM Please see Amion for pager number during day hours 7:00am-4:30pm or 7:00am -11:30am on weekends

## 2022-03-02 NOTE — Transfer of Care (Signed)
Immediate Anesthesia Transfer of Care Note  Patient: Brenda Navarro  Procedure(s) Performed: LAPAROSCOPIC CHOLECYSTECTOMY (Abdomen)  Patient Location: PACU  Anesthesia Type:General  Level of Consciousness: awake, alert  and oriented  Airway & Oxygen Therapy: Patient Spontanous Breathing and Patient connected to nasal cannula oxygen  Post-op Assessment: Report given to RN and Post -op Vital signs reviewed and stable  Post vital signs: Reviewed and stable  Last Vitals:  Vitals Value Taken Time  BP 139/55 03/02/22 1200  Temp    Pulse 96 03/02/22 1204  Resp 18 03/02/22 1204  SpO2 92 % 03/02/22 1204  Vitals shown include unvalidated device data.  Last Pain:  Vitals:   03/02/22 0929  TempSrc:   PainSc: 5       Patients Stated Pain Goal: 3 (03/02/22 0929)  Complications: No notable events documented.

## 2022-03-02 NOTE — Anesthesia Procedure Notes (Signed)
Procedure Name: Intubation Date/Time: 03/02/2022 10:07 AM  Performed by: Leonor Liv, CRNAPre-anesthesia Checklist: Patient identified, Emergency Drugs available, Suction available and Patient being monitored Patient Re-evaluated:Patient Re-evaluated prior to induction Oxygen Delivery Method: Circle System Utilized Preoxygenation: Pre-oxygenation with 100% oxygen Induction Type: IV induction Ventilation: Mask ventilation without difficulty Laryngoscope Size: Mac and 3 Grade View: Grade I Tube type: Oral Tube size: 7.0 mm Number of attempts: 1 Airway Equipment and Method: Stylet Placement Confirmation: ETT inserted through vocal cords under direct vision, positive ETCO2 and breath sounds checked- equal and bilateral Secured at: 21 cm Tube secured with: Tape Dental Injury: Teeth and Oropharynx as per pre-operative assessment

## 2022-03-02 NOTE — Discharge Instructions (Signed)
[facility] ° °Please arrive at least 30 min before your appointment to complete your check in paperwork.  If you are unable to arrive 30 min prior to your appointment time we may have to cancel or reschedule you. °LAPAROSCOPIC SURGERY: POST OP INSTRUCTIONS °Always review your discharge instruction sheet given to you by the facility where your surgery was performed. °IF YOU HAVE DISABILITY OR FAMILY LEAVE FORMS, YOU MUST BRING THEM TO THE OFFICE FOR PROCESSING.   °DO NOT GIVE THEM TO YOUR DOCTOR. ° °PAIN CONTROL ° °First take acetaminophen (Tylenol) AND/or ibuprofen (Advil) to control your pain after surgery.  Follow directions on package.  Taking acetaminophen (Tylenol) and/or ibuprofen (Advil) regularly after surgery will help to control your pain and lower the amount of prescription pain medication you may need.  You should not take more than 4,000 mg (4 grams) of acetaminophen (Tylenol) in 24 hours.  You should not take ibuprofen (Advil), aleve, motrin, naprosyn or other NSAIDS if you have a history of stomach ulcers or chronic kidney disease.  °A prescription for pain medication may be given to you upon discharge.  Take your pain medication as prescribed, if you still have uncontrolled pain after taking acetaminophen (Tylenol) or ibuprofen (Advil). °Use ice packs to help control pain. °If you need a refill on your pain medication, please contact your pharmacy.  They will contact our office to request authorization. Prescriptions will not be filled after 5pm or on week-ends. ° °HOME MEDICATIONS °Take your usually prescribed medications unless otherwise directed. ° °DIET °You should follow a light diet the first few days after arrival home.  Be sure to include lots of fluids daily. Avoid fatty, fried foods.  ° °CONSTIPATION °It is common to experience some constipation after surgery and if you are taking pain medication.  Increasing fluid intake and taking a stool softener (such as Colace)  will usually help or prevent this problem from occurring.  A mild laxative (Milk of Magnesia or Miralax) should be taken according to package instructions if there are no bowel movements after 48 hours. ° °WOUND/INCISION CARE °Most patients will experience some swelling and bruising in the area of the incisions.  Ice packs will help.  Swelling and bruising can take several days to resolve.  °Unless discharge instructions indicate otherwise, follow guidelines below  °STERI-STRIPS - you may remove your outer bandages 48 hours after surgery, and you may shower at that time.  You have steri-strips (small skin tapes) in place directly over the incision.  These strips should be left on the skin for 7-10 days.   °DERMABOND/SKIN GLUE - you may shower in 24 hours.  The glue will flake off over the next 2-3 weeks. °Any sutures or staples will be removed at the office during your follow-up visit. ° °ACTIVITIES °You may resume regular (light) daily activities beginning the next day--such as daily self-care, walking, climbing stairs--gradually increasing activities as tolerated.  You may have sexual intercourse when it is comfortable.  Refrain from any heavy lifting or straining until approved by your doctor. °You may drive when you are no longer taking prescription pain medication, you can comfortably wear a seatbelt, and you can safely maneuver your car and apply brakes. ° °FOLLOW-UP °You should see your doctor in the office for a follow-up appointment approximately 2-3 weeks after your surgery.  You should have been given your post-op/follow-up appointment when your surgery was scheduled.  If you did not receive a post-op/follow-up appointment, make sure   that you call for this appointment within a day or two after you arrive home to insure a convenient appointment time. ° ° °WHEN TO CALL YOUR DOCTOR: °Fever over 101.0 °Inability to urinate °Continued bleeding from incision. °Increased pain, redness, or drainage from the  incision. °Increasing abdominal pain ° °The clinic staff is available to answer your questions during regular business hours.  Please don’t hesitate to call and ask to speak to one of the nurses for clinical concerns.  If you have a medical emergency, go to the nearest emergency room or call 911.  A surgeon from [facility] is always on call at the hospital. °[address], [address], [place], [place]  [address] ? [address] [address], [place], [place]   [address] °[phone] ? [phone] ? FAX [phone] ° ° ° ° °Managing Your Pain After Surgery Without Opioids ° ° ° °Thank you for participating in our program to help patients manage their pain after surgery without opioids. This is part of our effort to provide you with the best care possible, without exposing you or your family to the risk that opioids pose. ° °What pain can I expect after surgery? °You can expect to have some pain after surgery. This is normal. The pain is typically worse the day after surgery, and quickly begins to get better. °Many studies have found that many patients are able to manage their pain after surgery with Over-the-Counter (OTC) medications such as Tylenol and Motrin. If you have a condition that does not allow you to take Tylenol or Motrin, notify your surgical team. ° °How will I manage my pain? °The best strategy for controlling your pain after surgery is around the clock pain control with Tylenol (acetaminophen) and Motrin (ibuprofen or Advil). Alternating these medications with each other allows you to maximize your pain control. In addition to Tylenol and Motrin, you can use heating pads or ice packs on your incisions to help reduce your pain. ° °How will I alternate your regular strength over-the-counter pain medication? °You will take a dose of pain medication every three hours. °Start by taking 650 mg of Tylenol (2 pills of 325 mg) °3 hours later take 600 mg of Motrin (3 pills of 200 mg) °3 hours after  taking the Motrin take 650 mg of Tylenol °3 hours after that take 600 mg of Motrin. ° ° °- 1 - ° °See example - if your first dose of Tylenol is at 12:00 PM ° ° °12:00 PM Tylenol 650 mg (2 pills of 325 mg)  °3:00 PM Motrin 600 mg (3 pills of 200 mg)  °6:00 PM Tylenol 650 mg (2 pills of 325 mg)  °9:00 PM Motrin 600 mg (3 pills of 200 mg)  °Continue alternating every 3 hours  ° °We recommend that you follow this schedule around-the-clock for at least 3 days after surgery, or until you feel that it is no longer needed. Use the table on the last page of this handout to keep track of the medications you are taking. °Important: °Do not take more than 3000mg of Tylenol or 3200mg of Motrin in a 24-hour period. °Do not take ibuprofen/Motrin if you have a history of bleeding stomach ulcers, severe kidney disease, &/or actively taking a blood thinner ° °What if I still have pain? °If you have pain that is not controlled with the over-the-counter pain medications (Tylenol and Motrin or Advil) you might have what we call “breakthrough” pain. You will receive a prescription   for a small amount of an opioid pain medication such as Oxycodone, Tramadol, or Tylenol with Codeine. Use these opioid pills in the first 24 hours after surgery if you have breakthrough pain. Do not take more than 1 pill every 4-6 hours. ° °If you still have uncontrolled pain after using all opioid pills, don't hesitate to call our staff using the number provided. We will help make sure you are managing your pain in the best way possible, and if necessary, we can provide a prescription for additional pain medication. ° ° °Day 1   ° °Time  °Name of Medication Number of pills taken  °Amount of Acetaminophen  °Pain Level  ° °Comments  °AM PM       °AM PM       °AM PM       °AM PM       °AM PM       °AM PM       °AM PM       °AM PM       °Total Daily amount of Acetaminophen °Do not take more than  3,000 mg per day    ° ° °Day 2   ° °Time  °Name of Medication  Number of pills °taken  °Amount of Acetaminophen  °Pain Level  ° °Comments  °AM PM       °AM PM       °AM PM       °AM PM       °AM PM       °AM PM       °AM PM       °AM PM       °Total Daily amount of Acetaminophen °Do not take more than  3,000 mg per day    ° ° °Day 3   ° °Time  °Name of Medication Number of pills taken  °Amount of Acetaminophen  °Pain Level  ° °Comments  °AM PM       °AM PM       °AM PM       °AM PM       ° ° ° °AM PM       °AM PM       °AM PM       °AM PM       °Total Daily amount of Acetaminophen °Do not take more than  3,000 mg per day    ° ° °Day 4   ° °Time  °Name of Medication Number of pills taken  °Amount of Acetaminophen  °Pain Level  ° °Comments  °AM PM       °AM PM       °AM PM       °AM PM       °AM PM       °AM PM       °AM PM       °AM PM       °Total Daily amount of Acetaminophen °Do not take more than  3,000 mg per day    ° ° °Day 5   ° °Time  °Name of Medication Number °of pills taken  °Amount of Acetaminophen  °Pain Level  ° °Comments  °AM PM       °AM PM       °AM PM       °AM PM       °AM PM       °AM   PM       °AM PM       °AM PM       °Total Daily amount of Acetaminophen °Do not take more than  3,000 mg per day    ° ° ° °Day 6   ° °Time  °Name of Medication Number of pills °taken  °Amount of Acetaminophen  °Pain Level  °Comments  °AM PM       °AM PM       °AM PM       °AM PM       °AM PM       °AM PM       °AM PM       °AM PM       °Total Daily amount of Acetaminophen °Do not take more than  3,000 mg per day    ° ° °Day 7   ° °Time  °Name of Medication Number of pills taken  °Amount of Acetaminophen  °Pain Level  ° °Comments  °AM PM       °AM PM       °AM PM       °AM PM       °AM PM       °AM PM       °AM PM       °AM PM       °Total Daily amount of Acetaminophen °Do not take more than  3,000 mg per day    ° ° ° ° °For additional information about how and where to safely dispose of unused opioid °medications - https://www.morepowerfulnc.org ° °Disclaimer: This document  contains information and/or instructional materials adapted from [facility] for the typical patient with your condition. It does not replace medical advice from your health care provider because your experience may differ from that of the °typical patient. Talk to your health care provider if you have any questions about this °document, your condition or your treatment plan. °Adapted from [facility] ° °

## 2022-03-02 NOTE — Anesthesia Preprocedure Evaluation (Signed)
Anesthesia Evaluation  Patient identified by MRN, date of birth, ID band Patient awake    Reviewed: Allergy & Precautions, NPO status , Patient's Chart, lab work & pertinent test results  Airway Mallampati: II  TM Distance: >3 FB Neck ROM: Full    Dental  (+) Dental Advisory Given   Pulmonary former smoker,    breath sounds clear to auscultation       Cardiovascular negative cardio ROS   Rhythm:Regular Rate:Normal     Neuro/Psych negative neurological ROS     GI/Hepatic negative GI ROS, Neg liver ROS,   Endo/Other  negative endocrine ROS  Renal/GU negative Renal ROS     Musculoskeletal   Abdominal   Peds  Hematology  (+) Blood dyscrasia, anemia ,   Anesthesia Other Findings   Reproductive/Obstetrics                             Lab Results  Component Value Date   WBC 6.9 03/02/2022   HGB 11.4 (L) 03/02/2022   HCT 35.9 (L) 03/02/2022   MCV 83.7 03/02/2022   PLT 330 03/02/2022   Lab Results  Component Value Date   CREATININE 0.90 03/02/2022   BUN 6 03/02/2022   NA 139 03/02/2022   K 3.7 03/02/2022   CL 106 03/02/2022   CO2 26 03/02/2022    Anesthesia Physical Anesthesia Plan  ASA: 2  Anesthesia Plan: General   Post-op Pain Management: Toradol IV (intra-op)*   Induction: Intravenous  PONV Risk Score and Plan: 3 and Dexamethasone, Ondansetron, Midazolam and Treatment may vary due to age or medical condition  Airway Management Planned: Oral ETT  Additional Equipment: None  Intra-op Plan:   Post-operative Plan: Extubation in OR  Informed Consent: I have reviewed the patients History and Physical, chart, labs and discussed the procedure including the risks, benefits and alternatives for the proposed anesthesia with the patient or authorized representative who has indicated his/her understanding and acceptance.     Dental advisory given  Plan Discussed with:  CRNA  Anesthesia Plan Comments:         Anesthesia Quick Evaluation

## 2022-03-02 NOTE — Anesthesia Postprocedure Evaluation (Signed)
Anesthesia Post Note  Patient: Brenda Navarro  Procedure(s) Performed: LAPAROSCOPIC CHOLECYSTECTOMY (Abdomen)     Patient location during evaluation: PACU Anesthesia Type: General Level of consciousness: awake and alert, oriented and patient cooperative Pain management: pain level controlled Vital Signs Assessment: post-procedure vital signs reviewed and stable Respiratory status: spontaneous breathing, nonlabored ventilation and respiratory function stable Cardiovascular status: blood pressure returned to baseline and stable Postop Assessment: no apparent nausea or vomiting Anesthetic complications: no   No notable events documented.  Last Vitals:  Vitals:   03/02/22 0757 03/02/22 0911  BP: 122/70 130/65  Pulse: 72 71  Resp: 18 18  Temp: 37 C 36.8 C  SpO2: 100% 97%    Last Pain:  Vitals:   03/02/22 0929  TempSrc:   PainSc: 5                  Lannie Fields

## 2022-03-03 ENCOUNTER — Encounter (HOSPITAL_COMMUNITY): Payer: Self-pay | Admitting: General Surgery

## 2022-03-03 LAB — HEPATIC FUNCTION PANEL
ALT: 194 U/L — ABNORMAL HIGH (ref 0–44)
AST: 72 U/L — ABNORMAL HIGH (ref 15–41)
Albumin: 2.9 g/dL — ABNORMAL LOW (ref 3.5–5.0)
Alkaline Phosphatase: 116 U/L (ref 38–126)
Bilirubin, Direct: <0.1 mg/dL (ref 0.0–0.2)
Total Bilirubin: 0.4 mg/dL (ref 0.3–1.2)
Total Protein: 5.7 g/dL — ABNORMAL LOW (ref 6.5–8.1)

## 2022-03-03 LAB — BASIC METABOLIC PANEL
Anion gap: 5 (ref 5–15)
BUN: 6 mg/dL (ref 6–20)
CO2: 25 mmol/L (ref 22–32)
Calcium: 8.5 mg/dL — ABNORMAL LOW (ref 8.9–10.3)
Chloride: 107 mmol/L (ref 98–111)
Creatinine, Ser: 0.88 mg/dL (ref 0.44–1.00)
GFR, Estimated: >60 mL/min (ref >60)
Glucose, Bld: 166 mg/dL — ABNORMAL HIGH (ref 70–99)
Potassium: 3.8 mmol/L (ref 3.5–5.1)
Sodium: 137 mmol/L (ref 135–145)

## 2022-03-03 LAB — SURGICAL PATHOLOGY

## 2022-03-03 MED ORDER — ENOXAPARIN SODIUM 40 MG/0.4ML IJ SOSY
40.0000 mg | PREFILLED_SYRINGE | INTRAMUSCULAR | Status: DC
  Administered 2022-03-03: 40 mg via SUBCUTANEOUS
  Filled 2022-03-03 (×2): qty 0.4

## 2022-03-03 MED ORDER — DOCUSATE SODIUM 100 MG PO CAPS
100.0000 mg | ORAL_CAPSULE | Freq: Two times a day (BID) | ORAL | Status: DC
  Administered 2022-03-03 – 2022-03-04 (×3): 100 mg via ORAL
  Filled 2022-03-03 (×3): qty 1

## 2022-03-03 MED ORDER — TRAMADOL HCL 50 MG PO TABS
50.0000 mg | ORAL_TABLET | Freq: Four times a day (QID) | ORAL | Status: DC | PRN
  Administered 2022-03-03: 50 mg via ORAL
  Filled 2022-03-03: qty 1

## 2022-03-03 MED ORDER — METHOCARBAMOL 500 MG PO TABS
500.0000 mg | ORAL_TABLET | Freq: Four times a day (QID) | ORAL | Status: DC | PRN
  Filled 2022-03-03: qty 1

## 2022-03-03 MED ORDER — HYDROMORPHONE HCL 1 MG/ML IJ SOLN: 0.5000 mg | INTRAMUSCULAR | Status: DC | PRN

## 2022-03-03 MED ORDER — POLYETHYLENE GLYCOL 3350 17 G PO PACK
17.0000 g | PACK | Freq: Every day | ORAL | Status: DC | PRN
Start: 2022-03-03 — End: 2022-03-04

## 2022-03-03 MED ORDER — IBUPROFEN 200 MG PO TABS
600.0000 mg | ORAL_TABLET | Freq: Four times a day (QID) | ORAL | Status: DC
  Administered 2022-03-03 – 2022-03-04 (×4): 600 mg via ORAL
  Filled 2022-03-03 (×4): qty 3

## 2022-03-03 MED ORDER — ACETAMINOPHEN 650 MG RE SUPP
650.0000 mg | Freq: Four times a day (QID) | RECTAL | Status: DC
  Filled 2022-03-03 (×5): qty 1

## 2022-03-03 MED ORDER — ACETAMINOPHEN 500 MG PO TABS
1000.0000 mg | ORAL_TABLET | Freq: Four times a day (QID) | ORAL | Status: DC
  Administered 2022-03-03 – 2022-03-04 (×4): 1000 mg via ORAL
  Filled 2022-03-03 (×4): qty 2

## 2022-03-03 MED ORDER — METHOCARBAMOL 500 MG PO TABS
500.0000 mg | ORAL_TABLET | Freq: Four times a day (QID) | ORAL | Status: DC
  Administered 2022-03-03 – 2022-03-04 (×4): 500 mg via ORAL
  Filled 2022-03-03 (×4): qty 1

## 2022-03-03 NOTE — Progress Notes (Signed)
[facility] Progress Note  1 Day Post-Op  Subjective: CC-  Had a rough night. Having a lot of abdominal pain, different than prior to surgery. Some nausea, no emesis. Tolerating liquids. No flatus or BM. She has ambulated to the restroom.  Objective: Vital signs in last 24 hours: Temp:  [97.8 F (36.6 C)-98.7 F (37.1 C)] 98.3 F (36.8 C) (08/24 0743) Pulse Rate:  [71-95] 75 (08/24 0743) Resp:  [12-19] 16 (08/24 0743) BP: (103-142)/(55-82) 134/69 (08/24 0743) SpO2:  [96 %-100 %] 100 % (08/24 0743) Weight:  [101.2 kg-101.3 kg] 101.3 kg (08/24 0500)    Intake/Output from previous day: 08/23 0701 - 08/24 0700 In: 2856.3 [I.V.:2801.3; IV Piggyback:55] Out: 75 [Blood:75] Intake/Output this shift: No intake/output data recorded.  PE: Gen:  Alert, NAD, pleasant Pulm: rate and effort normal Abd: soft, minimal distension, appropriately tender over incisions, lap incisions with cdi dressings, +BS  Lab Results:  Recent Labs    03/01/22 0219 03/02/22 0330  WBC 11.2* 6.9  HGB 12.6 11.4*  HCT 38.6 35.9*  PLT 345 330   BMET Recent Labs    03/02/22 0330 03/03/22 0304  NA 139 137  K 3.7 3.8  CL 106 107  CO2 26 25  GLUCOSE 99 166*  BUN 6 6  CREATININE 0.90 0.88  CALCIUM 8.7* 8.5*   PT/INR No results for input(s): "LABPROT", "INR" in the last 72 hours. CMP     Component Value Date/Time   NA 137 03/03/2022 0304   NA 141 02/20/2020 1343   K 3.8 03/03/2022 0304   CL 107 03/03/2022 0304   CO2 25 03/03/2022 0304   GLUCOSE 166 (H) 03/03/2022 0304   BUN 6 03/03/2022 0304   BUN 9 02/20/2020 1343   CREATININE 0.88 03/03/2022 0304   CALCIUM 8.5 (L) 03/03/2022 0304   PROT 5.7 (L) 03/03/2022 0304   PROT 6.6 02/20/2020 1343   ALBUMIN 2.9 (L) 03/03/2022 0304   ALBUMIN 4.2 02/20/2020 1343   AST 72 (H) 03/03/2022 0304   ALT 194 (H) 03/03/2022 0304   ALKPHOS 116 03/03/2022 0304   BILITOT 0.4 03/03/2022 0304   BILITOT 0.4 02/20/2020 1343   GFRNONAA >60  03/03/2022 0304   GFRAA 130 02/20/2020 1343   Lipase     Component Value Date/Time   LIPASE 25 03/01/2022 0219       Studies/Results: MR ABDOMEN MRCP W WO CONTAST  Result Date: 03/01/2022 CLINICAL DATA:  Abdominal pain times a few weeks.  Cholelithiasis. EXAM: MRI ABDOMEN WITHOUT AND WITH CONTRAST (INCLUDING MRCP) TECHNIQUE: Multiplanar multisequence MR imaging of the abdomen was performed both before and after the administration of intravenous contrast. Heavily T2-weighted images of the biliary and pancreatic ducts were obtained, and three-dimensional MRCP images were rendered by post processing. CONTRAST:  42mL GADAVIST GADOBUTROL 1 MMOL/ML IV SOLN COMPARISON:  Abdominal ultrasound dated March 01, 2022. FINDINGS: Lower chest: No acute abnormality. Hepatobiliary: No significant hepatic steatosis. No suspicious hepatic lesion. Mild periportal edema. Cholelithiasis without findings of acute cholecystitis. No biliary ductal dilation the common bile duct measures 4-5 mm. No choledocholithiasis. Pancreas: Intrinsic T1 signal of the pancreatic parenchyma is within normal limits. No pancreatic ductal dilation. No pancreatic divisum. No cystic or solid hyperenhancing pancreatic lesion identified. Spleen:  No splenomegaly or focal splenic lesion. Adrenals/Urinary Tract: No masses identified. No evidence of hydronephrosis. Stomach/Bowel: Visualized portions within the abdomen are unremarkable. Vascular/Lymphatic: No pathologically enlarged lymph nodes identified. No abdominal aortic aneurysm demonstrated. Other:  None. Musculoskeletal: No suspicious  bone lesions identified. IMPRESSION: 1. Cholelithiasis without MRI findings of acute cholecystitis. 2. No biliary ductal dilation or choledocholithiasis. 3. Mild periportal edema is nonspecific but most commonly seen in the setting of hepatitis or abundant intravenous rehydration. Electronically Signed   By: Maudry Mayhew M.D.   On: 03/01/2022 11:57   MR 3D  Recon At Scanner  Result Date: 03/01/2022 CLINICAL DATA:  Abdominal pain times a few weeks.  Cholelithiasis. EXAM: MRI ABDOMEN WITHOUT AND WITH CONTRAST (INCLUDING MRCP) TECHNIQUE: Multiplanar multisequence MR imaging of the abdomen was performed both before and after the administration of intravenous contrast. Heavily T2-weighted images of the biliary and pancreatic ducts were obtained, and three-dimensional MRCP images were rendered by post processing. CONTRAST:  25mL GADAVIST GADOBUTROL 1 MMOL/ML IV SOLN COMPARISON:  Abdominal ultrasound dated March 01, 2022. FINDINGS: Lower chest: No acute abnormality. Hepatobiliary: No significant hepatic steatosis. No suspicious hepatic lesion. Mild periportal edema. Cholelithiasis without findings of acute cholecystitis. No biliary ductal dilation the common bile duct measures 4-5 mm. No choledocholithiasis. Pancreas: Intrinsic T1 signal of the pancreatic parenchyma is within normal limits. No pancreatic ductal dilation. No pancreatic divisum. No cystic or solid hyperenhancing pancreatic lesion identified. Spleen:  No splenomegaly or focal splenic lesion. Adrenals/Urinary Tract: No masses identified. No evidence of hydronephrosis. Stomach/Bowel: Visualized portions within the abdomen are unremarkable. Vascular/Lymphatic: No pathologically enlarged lymph nodes identified. No abdominal aortic aneurysm demonstrated. Other:  None. Musculoskeletal: No suspicious bone lesions identified. IMPRESSION: 1. Cholelithiasis without MRI findings of acute cholecystitis. 2. No biliary ductal dilation or choledocholithiasis. 3. Mild periportal edema is nonspecific but most commonly seen in the setting of hepatitis or abundant intravenous rehydration. Electronically Signed   By: Maudry Mayhew M.D.   On: 03/01/2022 11:57    Anti-infectives: Anti-infectives (From admission, onward)    Start     Dose/Rate Route Frequency Ordered Stop   03/01/22 1230  cefTRIAXone (ROCEPHIN) 2 g in sodium  chloride 0.9 % 100 mL IVPB  Status:  Discontinued        2 g 200 mL/hr over 30 Minutes Intravenous Every 24 hours 03/01/22 1150 03/02/22 1325        Assessment/Plan  Acute cholecystitis POD#1 s/p laparoscopic cholecystectomy 8/23 Dr. Andrey Campanile - MRCP preop negative for choledocholithiasis. Unable to obtain IOC, but LFTs trending down today - Schedule tylenol and ibuprofen for better pain control. Ice packs. Mobilize. Possible discharge later today if pain control improved.  ID - rocephin periop FEN - SLIV, reg diet, colace VTE - lovenox Foley - none    LOS: 2 days    Franne Forts, [facility] 03/03/2022, 8:41 AM Please see Amion for pager number during day hours 7:00am-4:30pm

## 2022-03-04 ENCOUNTER — Other Ambulatory Visit (HOSPITAL_COMMUNITY): Payer: Self-pay

## 2022-03-04 MED ORDER — PHENOL 1.4 % MT LIQD: 1.0000 | OROMUCOSAL | Status: DC | PRN

## 2022-03-04 MED ORDER — ACETAMINOPHEN 500 MG PO TABS: 1000.0000 mg | ORAL_TABLET | Freq: Four times a day (QID) | ORAL | 0 refills | Status: DC | PRN

## 2022-03-04 MED ORDER — VALACYCLOVIR HCL 1 G PO TABS
2000.0000 mg | ORAL_TABLET | Freq: Two times a day (BID) | ORAL | 0 refills | Status: DC
  Filled 2022-03-04: qty 2, 1d supply, fill #0

## 2022-03-04 MED ORDER — METHOCARBAMOL 500 MG PO TABS
500.0000 mg | ORAL_TABLET | Freq: Four times a day (QID) | ORAL | 0 refills | Status: DC | PRN
  Filled 2022-03-04: qty 30, 8d supply, fill #0

## 2022-03-04 MED ORDER — POLYETHYLENE GLYCOL 3350 17 G PO PACK
17.0000 g | PACK | Freq: Every day | ORAL | Status: DC
  Administered 2022-03-04: 17 g via ORAL
  Filled 2022-03-04: qty 1

## 2022-03-04 MED ORDER — OXYCODONE HCL 10 MG PO TABS
5.0000 mg | ORAL_TABLET | Freq: Four times a day (QID) | ORAL | 0 refills | Status: DC | PRN
  Filled 2022-03-04: qty 20, 5d supply, fill #0

## 2022-03-04 MED ORDER — POLYETHYLENE GLYCOL 3350 17 G PO PACK: 17.0000 g | PACK | Freq: Every day | ORAL | 0 refills | Status: DC | PRN

## 2022-03-04 MED ORDER — VALACYCLOVIR HCL 500 MG PO TABS
2000.0000 mg | ORAL_TABLET | Freq: Two times a day (BID) | ORAL | Status: DC
Start: 2022-03-04 — End: 2022-03-04
  Administered 2022-03-04: 2000 mg via ORAL
  Filled 2022-03-04: qty 4

## 2022-03-04 MED ORDER — OXYCODONE HCL 10 MG PO TABS
5.0000 mg | ORAL_TABLET | Freq: Four times a day (QID) | ORAL | 0 refills | Status: DC | PRN
Start: 2022-03-04 — End: 2022-03-04
  Filled 2022-03-04: qty 20, 5d supply, fill #0

## 2022-03-04 MED ORDER — IBUPROFEN 600 MG PO TABS: 600.0000 mg | ORAL_TABLET | Freq: Four times a day (QID) | ORAL | 0 refills | Status: DC | PRN

## 2022-03-04 NOTE — Plan of Care (Signed)

## 2022-03-04 NOTE — Discharge Summary (Signed)
[facility] Discharge Summary   Patient ID: Dymond Gutt MRN: [id number] DOB/AGE: 1985/11/10 36 y.o.  Admit date: 03/01/2022 Discharge date: 03/04/2022  Admitting Diagnosis: Acute cholecystitis   Discharge Diagnosis Patient Active Problem List   Diagnosis Date Noted   Choledocholithiasis 03/01/2022   Abnormal Pap smear of cervix 04/19/2021   Other fatigue 03/05/2020   Vitamin B12 deficiency 03/05/2020   Vitamin D deficiency 03/05/2020   Prediabetes 03/05/2020   Class 3 severe obesity with serious comorbidity and body mass index (BMI) of 40.0 to 44.9 in adult [facility]) 03/05/2020   Tobacco use disorder, mild, in controlled environment 09/27/2019   Morbid obesity (HCC) 09/25/2019   Mixed emotional features as adjustment reaction 08/24/2016    Consultants None  Imaging: No results found.  Procedures Dr. Andrey Campanile (03/02/2022) - Laparoscopic Cholecystectomy   Hospital Course:  Anivea Velasques is a 36 y.o. female who presented to [facility]01[facility]22 with worsening RUQ pain.  Workup showed leukocytosis with WBC 11.2 and elevated transaminases. RUQ u/s shows cholelithiasis without evidence of acute cholecystitis and mildly dilated common bile duct at 42mm. MRCP was obtained and negative for choledocholithiasis. Hepatitis panel was negative.  Patient was admitted and underwent procedure listed above.  Tolerated procedure well and was transferred to the floor.  Diet was advanced as tolerated.  LFTs trended down postoperatively. On POD2, the patient was voiding well, tolerating diet, ambulating well, pain well controlled, vital signs stable, incisions c/d/i and felt stable for discharge home.  Patient will follow up as below and knows to call with questions or concerns.    I have personally reviewed the patients medication history on the [place] controlled substance database.    Physical Exam: General:  Alert, NAD, pleasant, comfortable Pulm: rate and effort normal Abd:  Soft, ND,  appropriately tender, multiple lap incisions C/D/I dressings  Allergies as of 03/04/2022       Reactions   Nickel Rash   Blistering rash with prolonged contact        Medication List     TAKE these medications    acetaminophen 500 MG tablet Commonly known as: TYLENOL Take 2 tablets (1,000 mg total) by mouth every 6 (six) hours as needed for mild pain.   buPROPion 200 MG 12 hr tablet Commonly known as: WELLBUTRIN SR Take 1 tablet (200 mg total) by mouth 2 (two) times daily.   ibuprofen 600 MG tablet Commonly known as: ADVIL Take 1 tablet (600 mg total) by mouth every 6 (six) hours as needed for moderate pain.   methocarbamol 500 MG tablet Commonly known as: ROBAXIN Take 1 tablet (500 mg total) by mouth every 6 (six) hours as needed for muscle spasms.   Oxycodone HCl 10 MG Tabs Take 0.5-1 tablets (5-10 mg total) by mouth every 6 (six) hours as needed for moderate pain or severe pain.   polyethylene glycol 17 g packet Commonly known as: MIRALAX / GLYCOLAX Take 17 g by mouth daily as needed for mild constipation.   Saxenda 18 MG/3ML Sopn Generic drug: Liraglutide -Weight Management Inject 2.4mg  under the skin daily   valACYclovir 1000 MG tablet Commonly known as: VALTREX Take 2 tablets (2,000 mg total) by mouth 2 (two) times daily.          Follow-up Information     [facility], Hedda Slade, [place]. Go on 03/24/2022.   Specialty: General Surgery Why: Your appointment is 03/24/22 at 2 pm  Arrive 30 minutes prior to your appointment time, Please bring your insurance card and  photo ID Contact information: [address] [place] [address] [facility] [place] [address] [phone]                  Signed: Franne Forts, [facility] 03/04/2022, 9:25 AM Please see Amion for pager number during day hours 7:00am-4:30pm

## 2022-03-07 ENCOUNTER — Emergency Department (HOSPITAL_COMMUNITY): Payer: No Typology Code available for payment source

## 2022-03-07 ENCOUNTER — Inpatient Hospital Stay (HOSPITAL_COMMUNITY)
Admission: EM | Admit: 2022-03-07 | Discharge: 2022-03-09 | DRG: 446 | Disposition: A | Discharge status: Home / Self Care | Payer: No Typology Code available for payment source | Attending: Internal Medicine | Admitting: Internal Medicine

## 2022-03-07 ENCOUNTER — Encounter (HOSPITAL_COMMUNITY): Payer: Self-pay

## 2022-03-07 ENCOUNTER — Other Ambulatory Visit (HOSPITAL_COMMUNITY): Payer: Self-pay

## 2022-03-07 ENCOUNTER — Other Ambulatory Visit: Payer: Self-pay

## 2022-03-07 DIAGNOSIS — K8050 Calculus of bile duct without cholangitis or cholecystitis without obstruction: Secondary | ICD-10-CM | POA: Diagnosis present

## 2022-03-07 DIAGNOSIS — K8051 Calculus of bile duct without cholangitis or cholecystitis with obstruction: Secondary | ICD-10-CM | POA: Diagnosis not present

## 2022-03-07 DIAGNOSIS — F32A Depression, unspecified: Secondary | ICD-10-CM | POA: Diagnosis present

## 2022-03-07 DIAGNOSIS — E669 Obesity, unspecified: Secondary | ICD-10-CM | POA: Diagnosis present

## 2022-03-07 DIAGNOSIS — Z6839 Body mass index (BMI) 39.0-39.9, adult: Secondary | ICD-10-CM

## 2022-03-07 DIAGNOSIS — R1011 Right upper quadrant pain: Secondary | ICD-10-CM

## 2022-03-07 DIAGNOSIS — Z888 Allergy status to other drugs, medicaments and biological substances status: Secondary | ICD-10-CM

## 2022-03-07 DIAGNOSIS — K831 Obstruction of bile duct: Principal | ICD-10-CM

## 2022-03-07 DIAGNOSIS — Z87891 Personal history of nicotine dependence: Secondary | ICD-10-CM

## 2022-03-07 DIAGNOSIS — Z79899 Other long term (current) drug therapy: Secondary | ICD-10-CM

## 2022-03-07 DIAGNOSIS — E860 Dehydration: Secondary | ICD-10-CM | POA: Diagnosis present

## 2022-03-07 DIAGNOSIS — Z9049 Acquired absence of other specified parts of digestive tract: Secondary | ICD-10-CM

## 2022-03-07 LAB — URINALYSIS, ROUTINE W REFLEX MICROSCOPIC
Bilirubin Urine: NEGATIVE
Glucose, UA: NEGATIVE mg/dL
Hgb urine dipstick: NEGATIVE
Ketones, ur: NEGATIVE mg/dL
Leukocytes,Ua: NEGATIVE
Nitrite: NEGATIVE
Protein, ur: NEGATIVE mg/dL
Specific Gravity, Urine: 1.023 (ref 1.005–1.030)
pH: 5 (ref 5.0–8.0)

## 2022-03-07 LAB — CBC WITH DIFFERENTIAL/PLATELET
Abs Immature Granulocytes: 0.03 10*3/uL (ref 0.00–0.07)
Basophils Absolute: 0 10*3/uL (ref 0.0–0.1)
Basophils Relative: 0 %
Eosinophils Absolute: 0.1 10*3/uL (ref 0.0–0.5)
Eosinophils Relative: 1 %
HCT: 40.7 % (ref 36.0–46.0)
Hemoglobin: 12.8 g/dL (ref 12.0–15.0)
Immature Granulocytes: 0 %
Lymphocytes Relative: 31 %
Lymphs Abs: 2.6 10*3/uL (ref 0.7–4.0)
MCH: 26.3 pg (ref 26.0–34.0)
MCHC: 31.4 g/dL (ref 30.0–36.0)
MCV: 83.7 fL (ref 80.0–100.0)
Monocytes Absolute: 0.7 10*3/uL (ref 0.1–1.0)
Monocytes Relative: 9 %
Neutro Abs: 4.9 10*3/uL (ref 1.7–7.7)
Neutrophils Relative %: 59 %
Platelets: 379 10*3/uL (ref 150–400)
RBC: 4.86 MIL/uL (ref 3.87–5.11)
RDW: 15.7 % — ABNORMAL HIGH (ref 11.5–15.5)
WBC: 8.4 10*3/uL (ref 4.0–10.5)
nRBC: 0 % (ref 0.0–0.2)

## 2022-03-07 LAB — COMPREHENSIVE METABOLIC PANEL
ALT: 525 U/L — ABNORMAL HIGH (ref 0–44)
AST: 463 U/L — ABNORMAL HIGH (ref 15–41)
Albumin: 3.6 g/dL (ref 3.5–5.0)
Alkaline Phosphatase: 475 U/L — ABNORMAL HIGH (ref 38–126)
Anion gap: 10 (ref 5–15)
BUN: 5 mg/dL — ABNORMAL LOW (ref 6–20)
CO2: 23 mmol/L (ref 22–32)
Calcium: 9.2 mg/dL (ref 8.9–10.3)
Chloride: 106 mmol/L (ref 98–111)
Creatinine, Ser: 0.88 mg/dL (ref 0.44–1.00)
GFR, Estimated: >60 mL/min (ref >60)
Glucose, Bld: 113 mg/dL — ABNORMAL HIGH (ref 70–99)
Potassium: 4.1 mmol/L (ref 3.5–5.1)
Sodium: 139 mmol/L (ref 135–145)
Total Bilirubin: 1.2 mg/dL (ref 0.3–1.2)
Total Protein: 7.1 g/dL (ref 6.5–8.1)

## 2022-03-07 LAB — LIPASE, BLOOD: Lipase: 24 U/L (ref 11–51)

## 2022-03-07 LAB — I-STAT BETA HCG BLOOD, ED (MC, WL, AP ONLY): I-stat hCG, quantitative: <5 m[IU]/mL (ref <5)

## 2022-03-07 MED ORDER — OXYCODONE-ACETAMINOPHEN 5-325 MG PO TABS
1.0000 | ORAL_TABLET | Freq: Once | ORAL | Status: AC
  Administered 2022-03-07: 1 via ORAL
  Filled 2022-03-07: qty 1

## 2022-03-07 MED ORDER — SODIUM CHLORIDE 0.9 % IV BOLUS
1000.0000 mL | Freq: Once | INTRAVENOUS | Status: AC
  Administered 2022-03-07: 1000 mL via INTRAVENOUS

## 2022-03-07 MED ORDER — HYDROMORPHONE HCL 1 MG/ML IJ SOLN
0.5000 mg | Freq: Once | INTRAMUSCULAR | Status: AC
  Administered 2022-03-07: 0.5 mg via INTRAVENOUS
  Filled 2022-03-07: qty 1

## 2022-03-07 MED ORDER — ONDANSETRON 4 MG PO TBDP
4.0000 mg | ORAL_TABLET | Freq: Once | ORAL | Status: AC
Start: 2022-03-07 — End: 2022-03-07
  Administered 2022-03-07: 4 mg via ORAL
  Filled 2022-03-07: qty 1

## 2022-03-07 MED ORDER — ONDANSETRON 4 MG PO TBDP
4.0000 mg | ORAL_TABLET | Freq: Three times a day (TID) | ORAL | 0 refills | Status: DC | PRN
Start: 2022-03-07 — End: 2022-09-09
  Filled 2022-03-07: qty 15, 7d supply, fill #0

## 2022-03-07 NOTE — ED Triage Notes (Signed)
Was here last Monday and had gallbladder surgery but this am started having pain again in RUQ.  +nausea.

## 2022-03-07 NOTE — ED Provider Notes (Signed)
[facility] EMERGENCY DEPARTMENT Provider Note   CSN: [id number] Arrival date & time: 03/07/22  1315     History {Add pertinent medical, surgical, social history, OB history to HPI:1} Chief Complaint  Patient presents with   Abdominal Pain    Brenda Navarro is a 36 y.o. female.  Patient as above with significant medical history as below, including recent lap chole/cholecystectomy Dr Ok Anis, colposcopy, obesity who presents to the ED with complaint of ruq abd pain. S/p lap chole 8/23, was doing well up until this AM, began to have similar RUQ pain that she was having prior to her surgery Monday. Mild nausea w/o emesis. She had a BM yesterday that was without blood, no melena. Urinating normally. No dib or cp. No rashes. No drainage from her surgical wounds, no fevers or chills.   She called surgery office PTA and they instructed her to report to ED for eval   Past Medical History:  Diagnosis Date   Abnormal Pap smear    Back pain    Depression    Obesity    Uterine perforation by intrauterine contraceptive device 2017    Past Surgical History:  Procedure Laterality Date   ABDOMINAL SURGERY  2017   iud perforated uterus, had to remove   CHOLECYSTECTOMY N/A 03/02/2022   Procedure: LAPAROSCOPIC CHOLECYSTECTOMY;  Surgeon: Greer Pickerel, MD;  Location: [facility];  Service: General;  Laterality: N/A;   COLPOSCOPY  2011     The history is provided by the patient. No language interpreter was used.  Abdominal Pain Associated symptoms: nausea   Associated symptoms: no chest pain, no cough, no dysuria, no fever and no shortness of breath        Home Medications Prior to Admission medications   Medication Sig Start Date End Date Taking? Authorizing Provider  acetaminophen (TYLENOL) 500 MG tablet Take 2 tablets (1,000 mg total) by mouth every 6 (six) hours as needed for mild pain. 03/04/22   Meuth, Blaine Hamper, PA-C  buPROPion (WELLBUTRIN SR) 200 MG 12 hr tablet Take 1  tablet (200 mg total) by mouth 2 (two) times daily. Patient not taking: Reported on 03/01/2022 04/19/21 04/19/22  Nche, Charlene Brooke, NP  ibuprofen (ADVIL) 600 MG tablet Take 1 tablet (600 mg total) by mouth every 6 (six) hours as needed for moderate pain. 03/04/22   Meuth, Blaine Hamper, PA-C  Liraglutide -Weight Management (SAXENDA) 18 MG/3ML SOPN Inject 2.74m under the skin daily Patient not taking: Reported on 03/01/2022 09/03/21   Nche, CCharlene Brooke NP  methocarbamol (ROBAXIN) 500 MG tablet Take 1 tablet (500 mg total) by mouth every 6 (six) hours as needed for muscle spasms. 03/04/22   Meuth, Brooke A, PA-C  ondansetron (ZOFRAN-ODT) 4 MG disintegrating tablet Dissolve 1 tablet (4 mg total) by mouth every 8 (eight) hours as needed for nausea for up to 7 days 03/07/22     Oxycodone HCl 10 MG TABS Take 0.5-1 tablets (5-10 mg total) by mouth every 6 (six) hours as needed. 03/04/22   Meuth, Brooke A, PA-C  polyethylene glycol (MIRALAX / GLYCOLAX) 17 g packet Take 17 g by mouth daily as needed for mild constipation. 03/04/22   Meuth, BBlaine Hamper PA-C  valACYclovir (VALTREX) 1000 MG tablet Take 2 tablets (2,000 mg total) by mouth 2 (two) times daily. 03/04/22   Meuth, BBlaine Hamper PA-C      Allergies    Nickel    Review of Systems   Review of Systems  Constitutional:  Negative for activity change and fever.  HENT:  Negative for facial swelling and trouble swallowing.   Eyes:  Negative for discharge and redness.  Respiratory:  Negative for cough and shortness of breath.   Cardiovascular:  Negative for chest pain and palpitations.  Gastrointestinal:  Positive for abdominal pain and nausea.  Genitourinary:  Negative for dysuria and flank pain.  Musculoskeletal:  Negative for back pain and gait problem.  Skin:  Negative for pallor and rash.  Neurological:  Negative for syncope and headaches.    Physical Exam Updated Vital Signs BP 134/79   Pulse 74   Temp 98.2 F (36.8 C) (Oral)   Resp 18   Ht 5' 3"   (1.6 m)   Wt 101.2 kg   LMP 02/18/2022   SpO2 98%   BMI 39.50 kg/m  Physical Exam Vitals and nursing note reviewed.  Constitutional:      General: She is not in acute distress.    Appearance: Normal appearance. She is well-developed. She is not ill-appearing or diaphoretic.  HENT:     Head: Normocephalic and atraumatic.     Right Ear: External ear normal.     Left Ear: External ear normal.     Nose: Nose normal.     Mouth/Throat:     Mouth: Mucous membranes are moist.  Eyes:     General: No scleral icterus.       Right eye: No discharge.        Left eye: No discharge.  Cardiovascular:     Rate and Rhythm: Normal rate and regular rhythm.     Pulses: Normal pulses.     Heart sounds: Normal heart sounds.  Pulmonary:     Effort: Pulmonary effort is normal. No tachypnea, accessory muscle usage or respiratory distress.     Breath sounds: Normal breath sounds.  Abdominal:     General: Abdomen is flat.     Palpations: Abdomen is soft.     Tenderness: There is abdominal tenderness in the right upper quadrant. There is no guarding or rebound.     Comments: Surgical wounds appear to be healing appropriately, no drainage or cellulitic findings a/w the wounds   Musculoskeletal:        General: Normal range of motion.     Cervical back: Normal range of motion.     Right lower leg: No edema.     Left lower leg: No edema.  Skin:    General: Skin is warm and dry.     Capillary Refill: Capillary refill takes less than 2 seconds.  Neurological:     Mental Status: She is alert and oriented to person, place, and time.     GCS: GCS eye subscore is 4. GCS verbal subscore is 5. GCS motor subscore is 6.  Psychiatric:        Mood and Affect: Mood normal.        Behavior: Behavior normal.     ED Results / Procedures / Treatments   Labs (all labs ordered are listed, but only abnormal results are displayed) Labs Reviewed  CBC WITH DIFFERENTIAL/PLATELET - Abnormal; Notable for the following  components:      Result Value   RDW 15.7 (*)    All other components within normal limits  COMPREHENSIVE METABOLIC PANEL - Abnormal; Notable for the following components:   Glucose, Bld 113 (*)    BUN 5 (*)    AST 463 (*)    ALT 525 (*)  Alkaline Phosphatase 475 (*)    All other components within normal limits  URINALYSIS, ROUTINE W REFLEX MICROSCOPIC - Abnormal; Notable for the following components:   Color, Urine AMBER (*)    APPearance HAZY (*)    All other components within normal limits  LIPASE, BLOOD  I-STAT BETA HCG BLOOD, ED ([facility], [facility], [facility] ONLY)    EKG None  Radiology No results found.  Procedures Procedures  {Document cardiac monitor, telemetry assessment procedure when appropriate:1}  Medications Ordered in ED Medications  sodium chloride 0.9 % bolus 1,000 mL (has no administration in time range)  HYDROmorphone (DILAUDID) injection 0.5 mg (has no administration in time range)  oxyCODONE-acetaminophen (PERCOCET/ROXICET) 5-325 MG per tablet 1 tablet (1 tablet Oral Given 03/07/22 1451)  ondansetron (ZOFRAN-ODT) disintegrating tablet 4 mg (4 mg Oral Given 03/07/22 1451)  oxyCODONE-acetaminophen (PERCOCET/ROXICET) 5-325 MG per tablet 1 tablet (1 tablet Oral Given 03/07/22 1844)    ED Course/ Medical Decision Making/ A&P                           Medical Decision Making Amount and/or Complexity of Data Reviewed Radiology: ordered.  Risk Prescription drug management.   This patient presents to the ED with chief complaint(s) of abd pain, nausea with pertinent past medical history of recent lap chole which further complicates the presenting complaint. The complaint involves an extensive differential diagnosis and also carries with it a high risk of complications and morbidity.     Differential diagnosis includes but is not exclusive to acute cholecystitis, intrathoracic causes for epigastric abdominal pain, gastritis, duodenitis, pancreatitis, small bowel or large  bowel obstruction, abdominal aortic aneurysm, hernia, gastritis, etc.  Serious etiologies were considered.   The initial plan is to screening labs, ctap, pain control   Additional history obtained: Additional history obtained from  na Records reviewed previous admission documents prior labs/imaging >> Dilated CBD on [place] 8/22 prior to lap chole , MRCP w/o choledocho 8/22.   Independent labs interpretation:  The following labs were independently interpreted:  AST 463 ALT 525 Alk phos 475 Tbili 1.2 WBC 8.4, HGB 12.8 UA w/o evidence of acute infection  Independent visualization of imaging: - I independently visualized the following imaging with scope of interpretation limited to determining acute life threatening conditions related to emergency care: CTAP, which revealed ***  Cardiac monitoring was reviewed and interpreted by myself which shows NSR  Treatment and Reassessment: Ivf Analgesics  >> improved   Consultation: - Consulted or discussed management/test interpretation w/ external professional: ***  Consideration for admission or further workup: Admission was considered ***  Social Determinants of health: Social History   Tobacco Use   Smoking status: Former    Packs/day: 0.50    Years: 11.00    Total pack years: 5.50    Types: Cigarettes    Quit date: 11/2021    Years since quitting: 0.3   Smokeless tobacco: Never  Vaping Use   Vaping Use: Never used  Substance Use Topics   Alcohol use: Yes    Comment: social   Drug use: No      {Document critical care time when appropriate:1} {Document review of labs and clinical decision tools ie heart score, Chads2Vasc2 etc:1}  {Document your independent review of radiology images, and any outside records:1} {Document your discussion with family members, caretakers, and with consultants:1} {Document social determinants of health affecting pt's care:1} {Document your decision making why or why not admission,  treatments  were needed:1} Final Clinical Impression(s) / ED Diagnoses Final diagnoses:  None    Rx / DC Orders ED Discharge Orders     None

## 2022-03-07 NOTE — ED Provider Triage Note (Signed)
Emergency Medicine Provider Triage Evaluation Note  Daveda Larock , a 36 y.o. female  was evaluated in triage.  Pt complains of RUQ abdominal pain. Just had her gallbladder removed and has follow up with the general surgery team. Has associated nausea. Denies vomiting at this time.     Review of Systems  Positive: As per HPI Negative:   Physical Exam  BP 120/67 (BP Location: Right Arm)   Pulse 73   Temp (!) 97 F (36.1 C) (Temporal)   Resp 16   Ht 5\' 3"  (1.6 m)   Wt 101.2 kg   LMP 02/18/2022   SpO2 97%   BMI 39.50 kg/m  Gen:   Awake, no distress   Resp:  Normal effort  MSK:   Moves extremities without difficulty  Other:  RUQ, epigastric TTP. Incisions in place without erythema or drainage noted to the areas.   Medical Decision Making  Medically screening exam initiated at 2:49 PM.  Appropriate orders placed.  Jazelyn Sipe was informed that the remainder of the evaluation will be completed by another provider, this initial triage assessment does not replace that evaluation, and the importance of remaining in the ED until their evaluation is complete.  Work-up initiated.    Ayane Delancey A, PA-C 03/07/22 1450

## 2022-03-08 ENCOUNTER — Encounter (HOSPITAL_COMMUNITY): Payer: Self-pay | Admitting: Internal Medicine

## 2022-03-08 ENCOUNTER — Inpatient Hospital Stay (HOSPITAL_COMMUNITY): Payer: No Typology Code available for payment source

## 2022-03-08 ENCOUNTER — Emergency Department (HOSPITAL_COMMUNITY): Payer: No Typology Code available for payment source

## 2022-03-08 DIAGNOSIS — K838 Other specified diseases of biliary tract: Secondary | ICD-10-CM | POA: Diagnosis not present

## 2022-03-08 DIAGNOSIS — R1010 Upper abdominal pain, unspecified: Secondary | ICD-10-CM | POA: Diagnosis not present

## 2022-03-08 DIAGNOSIS — Z79899 Other long term (current) drug therapy: Secondary | ICD-10-CM | POA: Diagnosis not present

## 2022-03-08 DIAGNOSIS — K831 Obstruction of bile duct: Principal | ICD-10-CM | POA: Insufficient documentation

## 2022-03-08 DIAGNOSIS — Z87891 Personal history of nicotine dependence: Secondary | ICD-10-CM | POA: Diagnosis not present

## 2022-03-08 DIAGNOSIS — K8050 Calculus of bile duct without cholangitis or cholecystitis without obstruction: Secondary | ICD-10-CM

## 2022-03-08 DIAGNOSIS — Z9049 Acquired absence of other specified parts of digestive tract: Secondary | ICD-10-CM | POA: Diagnosis not present

## 2022-03-08 DIAGNOSIS — R748 Abnormal levels of other serum enzymes: Secondary | ICD-10-CM | POA: Diagnosis not present

## 2022-03-08 DIAGNOSIS — Z6839 Body mass index (BMI) 39.0-39.9, adult: Secondary | ICD-10-CM | POA: Diagnosis not present

## 2022-03-08 DIAGNOSIS — Z888 Allergy status to other drugs, medicaments and biological substances status: Secondary | ICD-10-CM | POA: Diagnosis not present

## 2022-03-08 DIAGNOSIS — E860 Dehydration: Secondary | ICD-10-CM | POA: Diagnosis present

## 2022-03-08 DIAGNOSIS — K8051 Calculus of bile duct without cholangitis or cholecystitis with obstruction: Secondary | ICD-10-CM | POA: Diagnosis present

## 2022-03-08 DIAGNOSIS — R1011 Right upper quadrant pain: Secondary | ICD-10-CM | POA: Diagnosis not present

## 2022-03-08 DIAGNOSIS — E669 Obesity, unspecified: Secondary | ICD-10-CM | POA: Diagnosis present

## 2022-03-08 DIAGNOSIS — F32A Depression, unspecified: Secondary | ICD-10-CM | POA: Diagnosis present

## 2022-03-08 HISTORY — DX: Dehydration: E86.0

## 2022-03-08 HISTORY — DX: Calculus of bile duct without cholangitis or cholecystitis without obstruction: K80.50

## 2022-03-08 HISTORY — DX: Right upper quadrant pain: R10.11

## 2022-03-08 LAB — COMPREHENSIVE METABOLIC PANEL
ALT: 375 U/L — ABNORMAL HIGH (ref 0–44)
AST: 205 U/L — ABNORMAL HIGH (ref 15–41)
Albumin: 3.1 g/dL — ABNORMAL LOW (ref 3.5–5.0)
Alkaline Phosphatase: 361 U/L — ABNORMAL HIGH (ref 38–126)
Anion gap: 8 (ref 5–15)
BUN: 5 mg/dL — ABNORMAL LOW (ref 6–20)
CO2: 24 mmol/L (ref 22–32)
Calcium: 8.5 mg/dL — ABNORMAL LOW (ref 8.9–10.3)
Chloride: 106 mmol/L (ref 98–111)
Creatinine, Ser: 0.87 mg/dL (ref 0.44–1.00)
GFR, Estimated: >60 mL/min (ref >60)
Glucose, Bld: 98 mg/dL (ref 70–99)
Potassium: 3.9 mmol/L (ref 3.5–5.1)
Sodium: 138 mmol/L (ref 135–145)
Total Bilirubin: 0.8 mg/dL (ref 0.3–1.2)
Total Protein: 6.1 g/dL — ABNORMAL LOW (ref 6.5–8.1)

## 2022-03-08 LAB — CBC WITH DIFFERENTIAL/PLATELET
Abs Immature Granulocytes: 0.03 10*3/uL (ref 0.00–0.07)
Basophils Absolute: 0 10*3/uL (ref 0.0–0.1)
Basophils Relative: 1 %
Eosinophils Absolute: 0.1 10*3/uL (ref 0.0–0.5)
Eosinophils Relative: 1 %
HCT: 37.3 % (ref 36.0–46.0)
Hemoglobin: 11.8 g/dL — ABNORMAL LOW (ref 12.0–15.0)
Immature Granulocytes: 0 %
Lymphocytes Relative: 29 %
Lymphs Abs: 2.1 10*3/uL (ref 0.7–4.0)
MCH: 26.8 pg (ref 26.0–34.0)
MCHC: 31.6 g/dL (ref 30.0–36.0)
MCV: 84.6 fL (ref 80.0–100.0)
Monocytes Absolute: 0.6 10*3/uL (ref 0.1–1.0)
Monocytes Relative: 8 %
Neutro Abs: 4.4 10*3/uL (ref 1.7–7.7)
Neutrophils Relative %: 61 %
Platelets: 322 10*3/uL (ref 150–400)
RBC: 4.41 MIL/uL (ref 3.87–5.11)
RDW: 16.2 % — ABNORMAL HIGH (ref 11.5–15.5)
WBC: 7.3 10*3/uL (ref 4.0–10.5)
nRBC: 0 % (ref 0.0–0.2)

## 2022-03-08 LAB — MAGNESIUM: Magnesium: 2 mg/dL (ref 1.7–2.4)

## 2022-03-08 LAB — PROTIME-INR
INR: 1.1 (ref 0.8–1.2)
Prothrombin Time: 13.7 seconds (ref 11.4–15.2)

## 2022-03-08 MED ORDER — OXYCODONE HCL 5 MG PO TABS
5.0000 mg | ORAL_TABLET | ORAL | Status: DC | PRN
  Administered 2022-03-08 – 2022-03-09 (×3): 5 mg via ORAL
  Filled 2022-03-08 (×3): qty 1

## 2022-03-08 MED ORDER — MORPHINE SULFATE (PF) 4 MG/ML IV SOLN
4.0000 mg | Freq: Once | INTRAVENOUS | Status: AC
  Administered 2022-03-08: 4 mg via INTRAVENOUS
  Filled 2022-03-08: qty 1

## 2022-03-08 MED ORDER — LACTATED RINGERS IV SOLN: INTRAVENOUS | Status: DC

## 2022-03-08 MED ORDER — ACETAMINOPHEN 325 MG PO TABS
650.0000 mg | ORAL_TABLET | Freq: Four times a day (QID) | ORAL | Status: DC | PRN
Start: 2022-03-08 — End: 2022-03-08

## 2022-03-08 MED ORDER — SODIUM CHLORIDE 0.9 % IV SOLN: INTRAVENOUS | Status: AC

## 2022-03-08 MED ORDER — ONDANSETRON HCL 4 MG/2ML IJ SOLN
4.0000 mg | Freq: Four times a day (QID) | INTRAMUSCULAR | Status: DC | PRN
  Administered 2022-03-08: 4 mg via INTRAVENOUS
  Filled 2022-03-08: qty 2

## 2022-03-08 MED ORDER — HYDROMORPHONE HCL 1 MG/ML IJ SOLN
0.5000 mg | INTRAMUSCULAR | Status: DC | PRN
  Administered 2022-03-08 (×6): 0.5 mg via INTRAVENOUS
  Filled 2022-03-08: qty 0.5
  Filled 2022-03-08 (×5): qty 1

## 2022-03-08 MED ORDER — ACETAMINOPHEN 650 MG RE SUPP: 650.0000 mg | Freq: Four times a day (QID) | RECTAL | Status: DC | PRN

## 2022-03-08 MED ORDER — SENNOSIDES-DOCUSATE SODIUM 8.6-50 MG PO TABS
1.0000 | ORAL_TABLET | Freq: Two times a day (BID) | ORAL | Status: DC
  Administered 2022-03-08 – 2022-03-09 (×3): 1 via ORAL
  Filled 2022-03-08 (×3): qty 1

## 2022-03-08 MED ORDER — IOHEXOL 300 MG/ML  SOLN
100.0000 mL | Freq: Once | INTRAMUSCULAR | Status: AC | PRN
  Administered 2022-03-08: 100 mL via INTRAVENOUS

## 2022-03-08 MED ORDER — CEFAZOLIN SODIUM-DEXTROSE 2-4 GM/100ML-% IV SOLN
2.0000 g | Freq: Once | INTRAVENOUS | Status: DC
  Filled 2022-03-08: qty 100

## 2022-03-08 MED ORDER — NALOXONE HCL 0.4 MG/ML IJ SOLN: 0.4000 mg | INTRAMUSCULAR | Status: DC | PRN

## 2022-03-08 MED ORDER — GADOBUTROL 1 MMOL/ML IV SOLN
10.0000 mL | Freq: Once | INTRAVENOUS | Status: AC | PRN
  Administered 2022-03-08: 10 mL via INTRAVENOUS

## 2022-03-08 MED ORDER — PROCHLORPERAZINE EDISYLATE 10 MG/2ML IJ SOLN
10.0000 mg | Freq: Four times a day (QID) | INTRAMUSCULAR | Status: DC | PRN
  Administered 2022-03-08: 10 mg via INTRAVENOUS
  Filled 2022-03-08: qty 2

## 2022-03-08 MED ORDER — POLYETHYLENE GLYCOL 3350 17 G PO PACK: 17.0000 g | PACK | Freq: Every day | ORAL | Status: DC | PRN

## 2022-03-08 NOTE — H&P (Signed)
History and Physical    PLEASE NOTE THAT DRAGON DICTATION SOFTWARE WAS USED IN THE CONSTRUCTION OF THIS NOTE.   Brenda Navarro TGY:[id number] DOB: 10-05-1985 DOA: 03/07/2022  PCP: Pcp, No (will further assess) Patient coming from: home   I have personally briefly reviewed patient's old medical records in [place]  Chief Complaint: Abdominal pain  HPI: Brenda Navarro is a 36 y.o. female with medical history significant for acute cholecystitis status post recent laparoscopic cholecystectomy, who is admitted to [facility] on 03/07/2022 with obstructing choledocholithiasis after presenting from home to [facility] ED complaining of abdominal pain.   Patient recently hospitalized at [facility] from 03/01/2022 due to stricture*for acute cholecystitis, during which she underwent laparoscopic cholecystectomy on 03/02/2022 with Dr.**General surgery.  She was subsequently discharged home on 03/04/2022, but over the last 2 days has noted worsening sharp, nonradiating right upper quadrant abdominal discomfort, worse with palpation and worse with attempting to eat or drink anything.  This has been associated with intermittent nausea in the absence of vomiting.  She continues to experience regular bowel movements, most recent bowel movement occurring on 03/07/2022, and denies any associated melena or hematochezia.  No associated subjective fever, chills, rigors, or generalized myalgias.  No rash.  No dysuria or gross hematuria.  Not on any blood thinners as an outpatient, including no aspirin.  Of note, final set of liver enzymes during most recent hospitalization were drawn on 03/03/2022 and were notable for the following: Alkaline phosphatase 116, AST 72, ALT 194, total bilirubin 0.4.    ED Course:  Vital signs in the ED were notable for the following: Afebrile; heart rate 73-91; blood pressure 120/67; respiratory rate 16-18, oxygen saturation 96 to 100% on room air.  Labs were notable for the  following: CMP notable for the following: Creatinine 0.88, alkaline phosphatase 475, AST 463, ALT 525, total bilirubin 1.2.  CBC notable for white blood cell count 8400.  Urinalysis showed no white blood cells, no bacteria, and was sissy with elevated specific gravity of 1.023.  Imaging and additional notable ED work-up: CT abdomen/pelvis with contrast showed status post cholecystectomy, will demonstrating evidence of a 6 mm distal common bile duct stone, just above the ampulla, with associated mild central intrahepatic and extrahepatic ductal dilation.  EDP has notified on-call [facility], Dr. Loletha Carrow, of request for morning consultation and consideration for ERCP in the setting of suspected obstructing choledocholithiasis.  While in the ED, the following were administered: Zofran 4 mg IV x1, morphine 4 mg IV x1, Dilaudid 0.5 mg IV x1, Percocet 5/325 mg p.o. x2 tabs, normal saline x1 L bolus.     Review of Systems: As per HPI otherwise 10 point review of systems negative.   Past Medical History:  Diagnosis Date   Abnormal Pap smear    Back pain    Depression    Obesity    Uterine perforation by intrauterine contraceptive device 2017    Past Surgical History:  Procedure Laterality Date   ABDOMINAL SURGERY  2017   iud perforated uterus, had to remove   CHOLECYSTECTOMY N/A 03/02/2022   Procedure: LAPAROSCOPIC CHOLECYSTECTOMY;  Surgeon: Greer Pickerel, MD;  Location: [facility];  Service: General;  Laterality: N/A;   COLPOSCOPY  2011    Social History:  reports that she quit smoking about 3 months ago. Her smoking use included cigarettes. She has a 5.50 pack-year smoking history. She has never used smokeless tobacco. She reports current alcohol use. She reports that she does  not use drugs.   Allergies  Allergen Reactions   Nickel Rash    Blistering rash with prolonged contact    Family History  Adopted: Yes  Family history unknown: Yes    Family history reviewed and not  pertinent    Prior to Admission medications   Medication Sig Start Date End Date Taking? Authorizing Provider  acetaminophen (TYLENOL) 500 MG tablet Take 2 tablets (1,000 mg total) by mouth every 6 (six) hours as needed for mild pain. 03/04/22  Yes Meuth, Brooke A, PA-C  ibuprofen (ADVIL) 600 MG tablet Take 1 tablet (600 mg total) by mouth every 6 (six) hours as needed for moderate pain. 03/04/22  Yes Meuth, Brooke A, PA-C  methocarbamol (ROBAXIN) 500 MG tablet Take 1 tablet (500 mg total) by mouth every 6 (six) hours as needed for muscle spasms. 03/04/22  Yes Meuth, Brooke A, PA-C  ondansetron (ZOFRAN-ODT) 4 MG disintegrating tablet Dissolve 1 tablet (4 mg total) by mouth every 8 (eight) hours as needed for nausea for up to 7 days 03/07/22  Yes   Oxycodone HCl 10 MG TABS Take 0.5-1 tablets (5-10 mg total) by mouth every 6 (six) hours as needed. Patient taking differently: Take 5 mg by mouth every 6 (six) hours as needed (for pain). 03/04/22  Yes Meuth, Brooke A, PA-C  polyethylene glycol (MIRALAX / GLYCOLAX) 17 g packet Take 17 g by mouth daily as needed for mild constipation. 03/04/22  Yes Meuth, Brooke A, PA-C  buPROPion (WELLBUTRIN SR) 200 MG 12 hr tablet Take 1 tablet (200 mg total) by mouth 2 (two) times daily. Patient not taking: Reported on 03/01/2022 04/19/21 04/19/22  Flossie Buffy, NP  Liraglutide -Weight Management (SAXENDA) 18 MG/3ML SOPN Inject 2.67m under the skin daily Patient not taking: Reported on 03/01/2022 09/03/21   Nche, CCharlene Brooke NP  valACYclovir (VALTREX) 1000 MG tablet Take 2 tablets (2,000 mg total) by mouth 2 (two) times daily. Patient not taking: Reported on 03/07/2022 03/04/22   MWellington Hampshire PA-C     Objective    Physical Exam: Vitals:   03/08/22 0600 03/08/22 0645 03/08/22 0700 03/08/22 0748  BP:  125/74 123/72   Pulse:  73 73   Resp:   16   Temp: 98 F (36.7 C)   98.5 F (36.9 C)  TempSrc:    Oral  SpO2:  96% 95%   Weight:      Height:         General: appears to be stated age; alert, oriented Skin: warm, dry, no rash Head:  AT/[place] Mouth:  Oral mucosa membranes appear dry, normal dentition Neck: supple; trachea midline Heart:  RRR; did not appreciate any M/R/G Lungs: CTAB, did not appreciate any wheezes, rales, or rhonchi Abdomen: + BS; soft, ND, tenderness to palpation over the right upper quadrant, in the absence of any associated guarding, rigidity, or rebound tenderness. Vascular: 2+ pedal pulses b/l; 2+ radial pulses b/l Extremities: no peripheral edema, no muscle wasting Neuro: strength and sensation intact in upper and lower extremities b/l   Labs on Admission: I have personally reviewed following labs and imaging studies  CBC: Recent Labs  Lab 03/02/22 0330 03/07/22 1443 03/08/22 0338  WBC 6.9 8.4 7.3  NEUTROABS  --  4.9 4.4  HGB 11.4* 12.8 11.8*  HCT 35.9* 40.7 37.3  MCV 83.7 83.7 84.6  PLT 330 379 3409  Basic Metabolic Panel: Recent Labs  Lab 03/02/22 0330 03/03/22 0304 03/07/22 1443 03/08/22 0338  NA  139 137 139 138  K 3.7 3.8 4.1 3.9  CL 106 107 106 106  CO2 _0 GLUCOSE 99 166* 113* 98  BUN 6 6 5* 5*  CREATININE 0.90 0.88 0.88 0.87  CALCIUM 8.7* 8.5* 9.2 8.5*  MG  --   --   --  2.0   GFR: Estimated Creatinine Clearance: 103.4 mL/min (by C-G formula based on SCr of 0.87 mg/dL). Liver Function Tests: Recent Labs  Lab 03/02/22 0330 03/03/22 0304 03/07/22 1443 03/08/22 0338  AST 170* 72* 463* 205*  ALT 290* 194* 525* 375*  ALKPHOS 131* 116 475* 361*  BILITOT 1.0 0.4 1.2 0.8  PROT 6.0* 5.7* 7.1 6.1*  ALBUMIN 3.0* 2.9* 3.6 3.1*   Recent Labs  Lab 03/07/22 1443  LIPASE 24   No results for input(s): "AMMONIA" in the last 168 hours. Coagulation Profile: Recent Labs  Lab 03/08/22 0338  INR 1.1   Cardiac Enzymes: No results for input(s): "CKTOTAL", "CKMB", "CKMBINDEX", "TROPONINI" in the last 168 hours. BNP (last 3 results) No results for input(s): "PROBNP" in the  last 8760 hours. HbA1C: No results for input(s): "HGBA1C" in the last 72 hours. CBG: No results for input(s): "GLUCAP" in the last 168 hours. Lipid Profile: No results for input(s): "CHOL", "HDL", "LDLCALC", "TRIG", "CHOLHDL", "LDLDIRECT" in the last 72 hours. Thyroid Function Tests: No results for input(s): "TSH", "T4TOTAL", "FREET4", "T3FREE", "THYROIDAB" in the last 72 hours. Anemia Panel: No results for input(s): "VITAMINB12", "FOLATE", "FERRITIN", "TIBC", "IRON", "RETICCTPCT" in the last 72 hours. Urine analysis:    Component Value Date/Time   COLORURINE AMBER (A) 03/07/2022 2110   APPEARANCEUR HAZY (A) 03/07/2022 2110   LABSPEC 1.023 03/07/2022 2110   PHURINE 5.0 03/07/2022 2110   GLUCOSEU NEGATIVE 03/07/2022 2110   HGBUR NEGATIVE 03/07/2022 2110   BILIRUBINUR NEGATIVE 03/07/2022 2110   BILIRUBINUR Negative 02/25/2021 [place] 03/07/2022 2110   PROTEINUR NEGATIVE 03/07/2022 2110   UROBILINOGEN 0.2 02/25/2021 0954   UROBILINOGEN 2.0 (H) 12/08/2012 2144   NITRITE NEGATIVE 03/07/2022 2110   LEUKOCYTESUR NEGATIVE 03/07/2022 2110    Radiological Exams on Admission: CT ABDOMEN PELVIS W CONTRAST  Result Date: 03/08/2022 CLINICAL DATA:  Right upper quadrant pain, status post cholecystectomy EXAM: CT ABDOMEN AND PELVIS WITH CONTRAST TECHNIQUE: Multidetector CT imaging of the abdomen and pelvis was performed using the standard protocol following bolus administration of intravenous contrast. RADIATION DOSE REDUCTION: This exam was performed according to the departmental dose-optimization program which includes automated exposure control, adjustment of the mA and/or kV according to patient size and/or use of iterative reconstruction technique. CONTRAST:  158m OMNIPAQUE IOHEXOL 300 MG/ML  SOLN COMPARISON:  MRI abdomen dated 03/01/2022 FINDINGS: Lower chest: Lung bases are clear. Hepatobiliary: Liver is within normal limits. Status post cholecystectomy. Trace fluid in the  surgical bed (series 3/image 24), postoperative. No findings suspicious for bile leak. Mild central intrahepatic ductal dilatation. Dilated common duct, measuring 12 mm (coronal image 46). While poorly visualized on CT, a 6 mm distal CBD stone is suspected just above the ampulla (series 3/image 26). Pancreas: Within normal limits. Spleen: Within normal limits. Adrenals/Urinary Tract: Adrenal glands are within normal limits. Kidneys are within normal limits.  No hydronephrosis. Bladder is within normal limits. Stomach/Bowel: Stomach is within normal limits. No evidence of bowel obstruction. Normal appendix (series 3/image 47). No colonic wall thickening or inflammatory changes. Vascular/Lymphatic: No evidence of abdominal aortic aneurysm. No suspicious abdominopelvic lymphadenopathy. Reproductive: Uterus and bilateral ovaries are within  normal limits. Other: No abdominopelvic ascites. Postsurgical changes along the midline anterior abdominal wall. Musculoskeletal: Mild degenerative changes at L5-S1. IMPRESSION: Status post cholecystectomy. Suspected 6 mm distal CBD stone just above the ampulla. Associated mild central intrahepatic and extrahepatic ductal dilatation. ERCP is suggested. Electronically Signed   By: Julian Hy M.D.   On: 03/08/2022 00:32       Assessment/Plan   Principal Problem:   Common bile duct calculus Active Problems:   Dehydration   RUQ pain      #) Obstructing choledocholithiasis: In setting of recent laparoscopic cholecystectomy, patient presents with worsening right upper quadrant discomfort, with prandial exacerbation, associated nausea, with labs reflecting interval transaminitis, as well as threefold increase in alkaline phosphatase and total bilirubin relative to most recent set of previous liver enzymes, with the latter concerning for cholestatic pattern in setting of updated CT abdomen/pelvis which showed evidence of 6 mm distal common bile duct stone associated  with central intrahepatic and extrahepatic ductal dilation, as further detailed above.   EDP has notified on-call [facility], Dr. Loletha Carrow, of request for morning consultation and consideration for ERCP in the setting of suspected obstructing choledocholithiasis.  It does not appear that presentation is consistent with a; tinnitus, noting no evidence of associated jaundice or fever.  Additionally, presentation is not associate with any hypotension or altered mental status, and criteria for sepsis not currently met.  Consequently, we will refrain from empiric initiation of IV antibiotics at this time.   Plan: GI consulted, as above.  NPO.  Continuous IV fluids.  As needed IV Dilaudid.  As needed IV Zofran.  Check INR.  Repeat CMP/CBC in the morning.  Add on serum magnesium level.             #) Dehydration: Clinical suspicion for such, including the appearance of dry oral mucous membranes as well as laboratory findings notable UA, which demonstrates elevated specific gravity.  This is in the setting of 2 days of intermittent nausea as well as prandial abdominal discomfort resulting in significant reduction in oral intake over that timeframe.   Plan: Monitor strict I's and O's.  Daily weights.  Repeat CMP in the morning.  Continuous IV fluids, as above.        DVT prophylaxis: SCD's   Code Status: Full code Family Communication: none Disposition Plan: Per Rounding Team Consults called: EDP has contacted on-call [facility] (Dr. Loletha Carrow), as further detailed above;  Admission status: Inpatient    PLEASE NOTE THAT DRAGON DICTATION SOFTWARE WAS USED IN THE CONSTRUCTION OF THIS NOTE.   [place] DO [facility]  From [place]   03/08/2022, 7:48 AM

## 2022-03-08 NOTE — TOC CM/SW Note (Addendum)
  Transition of Care (TOC) Screening Note   Patient Details  Name:  Date of Birth: Aug 07, 1985   Patient has no PCP listed .   NCM spoke to patient regarding PCP. Patient is looking for a new PCP . She is aware she can be provided a list of providers in network with her insurance by calling number on insurance card.   NCM offered assistance if she needed. Patient actively looking .    Transition of Care Department [facility]) has reviewed patient and no TOC needs have been identified at this time. We will continue to monitor patient advancement through interdisciplinary progression rounds. If new patient transition needs arise, please place a TOC consult.

## 2022-03-08 NOTE — Progress Notes (Signed)
PROGRESS NOTE    Brenda Navarro  YNW:[id number] DOB: 12-10-1985 DOA: 03/07/2022 PCP: Pcp, No   Chief Complaint  Patient presents with   Abdominal Pain    Brief Narrative:   This is a no charge note as patient was seen and admitted earlier today, chart, imaging and labs were reviewed patient was seen and examined    36 y.o. female with medical history significant for acute cholecystitis status post recent laparoscopic cholecystectomy, who is admitted to [facility] on 03/07/2022 with obstructing choledocholithiasis after presenting from home to [facility] ED complaining of abdominal pain.   Assessment & Plan:   Principal Problem:   Common bile duct calculus Active Problems:   Dehydration   Right upper quadrant abdominal pain  Obstructing choledocholithiasis:  In setting of recent laparoscopic cholecystectomy, patient presents with worsening right upper quadrant discomfort, with prandial exacerbation, associated nausea, with labs reflecting interval transaminitis. -GI input greatly appreciated, plan for ERCP tomorrow around 1 PM, GI will obtain MRCP given total bili not significantly elevated. -Keep clear liquid diet today, n.p.o. at midnight - Continue with IV fluids -Monitor LFTs closely -Continue with as needed pain medications    DVT prophylaxis: SCD, hold subcu Lovenox for ERCP in a.m. Code Status: Full Family Communication:None at bedside Disposition:   Status is: Inpatient    Consultants:  GI  Subjective:  Denies any nausea or vomiting, but complains of abdominal pain  Objective: Vitals:   03/08/22 1045 03/08/22 1203 03/08/22 1215 03/08/22 1252  BP: 137/83  114/69 (!) 134/98  Pulse: 80  85 86  Resp: 16  16   Temp:  98.4 F (36.9 C)    TempSrc:  Oral    SpO2: 96%  96% 100%  Weight:      Height:        Intake/Output Summary (Last 24 hours) at 03/08/2022 1352 Last data filed at 03/08/2022 0748 Gross per 24 hour  Intake 1000 ml  Output --  Net 1000  ml   Filed Weights   03/07/22 1409  Weight: 101.2 kg    Examination:  Awake Alert, Oriented X 3, No new F.N deficits, Normal affect Symmetrical Chest wall movement, Good air movement bilaterally, CTAB +ve B.Sounds, Abd Soft, tenderness in right upper quadrant/epigastric area  no Cyanosis, Clubbing or edema, No new Rash or bruise       Data Reviewed: I have personally reviewed following labs and imaging studies  CBC: Recent Labs  Lab 03/02/22 0330 03/07/22 1443 03/08/22 0338  WBC 6.9 8.4 7.3  NEUTROABS  --  4.9 4.4  HGB 11.4* 12.8 11.8*  HCT 35.9* 40.7 37.3  MCV 83.7 83.7 84.6  PLT 330 379 322    Basic Metabolic Panel: Recent Labs  Lab 03/02/22 0330 03/03/22 0304 03/07/22 1443 03/08/22 0338  NA 139 137 139 138  K 3.7 3.8 4.1 3.9  CL 106 107 106 106  CO2 26 25 23 24   GLUCOSE 99 166* 113* 98  BUN 6 6 5* 5*  CREATININE 0.90 0.88 0.88 0.87  CALCIUM 8.7* 8.5* 9.2 8.5*  MG  --   --   --  2.0    GFR: Estimated Creatinine Clearance: 103.4 mL/min (by C-G formula based on SCr of 0.87 mg/dL).  Liver Function Tests: Recent Labs  Lab 03/02/22 0330 03/03/22 0304 03/07/22 1443 03/08/22 0338  AST 170* 72* 463* 205*  ALT 290* 194* 525* 375*  ALKPHOS 131* 116 475* 361*  BILITOT 1.0 0.4 1.2 0.8  PROT 6.0*  5.7* 7.1 6.1*  ALBUMIN 3.0* 2.9* 3.6 3.1*    CBG: No results for input(s): "GLUCAP" in the last 168 hours.   No results found for this or any previous visit (from the past 240 hour(s)).       Radiology Studies: CT ABDOMEN PELVIS W CONTRAST  Result Date: 03/08/2022 CLINICAL DATA:  Right upper quadrant pain, status post cholecystectomy EXAM: CT ABDOMEN AND PELVIS WITH CONTRAST TECHNIQUE: Multidetector CT imaging of the abdomen and pelvis was performed using the standard protocol following bolus administration of intravenous contrast. RADIATION DOSE REDUCTION: This exam was performed according to the departmental dose-optimization program which includes  automated exposure control, adjustment of the mA and/or kV according to patient size and/or use of iterative reconstruction technique. CONTRAST:  OMNIPAQUE IOHEXOL 300 MG/ML  SOLN COMPARISON:  MRI abdomen dated 03/01/2022 FINDINGS: Lower chest: Lung bases are clear. Hepatobiliary: Liver is within normal limits. Status post cholecystectomy. Trace fluid in the surgical bed (series 3/image 24), postoperative. No findings suspicious for bile leak. Mild central intrahepatic ductal dilatation. Dilated common duct, measuring 12 mm (coronal image 46). While poorly visualized on CT, a 6 mm distal CBD stone is suspected just above the ampulla (series 3/image 26). Pancreas: Within normal limits. Spleen: Within normal limits. Adrenals/Urinary Tract: Adrenal glands are within normal limits. Kidneys are within normal limits.  No hydronephrosis. Bladder is within normal limits. Stomach/Bowel: Stomach is within normal limits. No evidence of bowel obstruction. Normal appendix (series 3/image 47). No colonic wall thickening or inflammatory changes. Vascular/Lymphatic: No evidence of abdominal aortic aneurysm. No suspicious abdominopelvic lymphadenopathy. Reproductive: Uterus and bilateral ovaries are within normal limits. Other: No abdominopelvic ascites. Postsurgical changes along the midline anterior abdominal wall. Musculoskeletal: Mild degenerative changes at L5-S1. IMPRESSION: Status post cholecystectomy. Suspected 6 mm distal CBD stone just above the ampulla. Associated mild central intrahepatic and extrahepatic ductal dilatation. ERCP is suggested. Electronically Signed   By: Charline Bills M.D.   On: 03/08/2022 00:32        Scheduled Meds:  senna-docusate  1 tablet Oral BID   Continuous Infusions:   LOS: 0 days       Huey Bienenstock, MD [facility]   To contact the attending provider between 7A-7P or the covering provider during after hours 7P-7A, please log into the web site  [email or web address] and access using universal [place] password for that web site. If you do not have the password, please call the hospital operator.  03/08/2022, 1:52 PM

## 2022-03-08 NOTE — Consult Note (Addendum)
[facility] Consult: 8:16 AM 03/08/2022  LOS: 0 days    Referring Provider: Dr Waldron Labs  Primary Care Physician:  Pcp, No Primary Gastroenterologist:   unassigned    Reason for Consultation: Choledocholithiasis.   HPI: Brenda Navarro is a 36 y.o. female.  Prior uterine perforation of IUD.    Acute cholecystitis about a week ago.  03/02/22 Lap chole.  "Patient had both an anterior and posterior cystic artery both going into the gallbladder.  (Dr Redmond Pulling) ended up taking down the anterior branch of the cystic artery in order to get around the cystic duct easily".  A cystic duct stone was milked out.  Unable to thread cholangiogram catheter into cystic duct but good flow of bile noted from duct.  Thus C gram was aborted.   Pre-op ultrasound which showed cholelithiasis and 7 mm CBD, normal Dopplers.  Preop MRCP displaying no biliary ductal dilatation or choledocholithiasis.  There was mild, nonspecific periportal edema. LFTs were elevated with normal T. bili, active alk phos 131, AST/ALT as high as 371/203 on or before the day of surgery.  These were on downward trend 1 day postop.\  Following discharge on this past Friday, the patient had expected abdominal soreness but overall pain was not severe.  She was tolerating food.  Yesterday morning developed more intense pain in the epigastric region, the same pain that had led up to her surgery.  She felt nauseous but did not vomit.  Return to the ER for evaluation.  No fever, hypotension, tachycardia, hypoxia.  T. bili normal 1.2.  Alk phos 475.  AST/ALT 463/525.  On repeat this morning these are elevated but downtrending.  Lipase 24.  WBCs normal. CTAP revealing changes of cholecystectomy and suspicion for 6 mm CBD stone down just proximal to the ampulla associated with  some mild intra and extrahepatic ductal dilatation.   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  No ETOH.  No tobacco.  No fm hx of GB dz, gi bleeds, GI cancers.      Past Medical History:  Diagnosis Date   Abnormal Pap smear    Back pain    Depression    Obesity    Uterine perforation by intrauterine contraceptive device 2017    Past Surgical History:  Procedure Laterality Date   ABDOMINAL SURGERY  2017   iud perforated uterus, had to remove   CHOLECYSTECTOMY N/A 03/02/2022   Procedure: LAPAROSCOPIC CHOLECYSTECTOMY;  Surgeon: Greer Pickerel, MD;  Location: [place];  Service: General;  Laterality: N/A;   COLPOSCOPY  2011    Prior to Admission medications   Medication Sig Start Date End Date Taking? Authorizing Provider  acetaminophen (TYLENOL) 500 MG tablet Take 2 tablets (1,000 mg total) by mouth every 6 (six) hours as needed for mild pain. 03/04/22  Yes Meuth, Brooke A, PA-C  ibuprofen (ADVIL) 600 MG tablet Take 1 tablet (600 mg total) by mouth every 6 (six) hours as needed for moderate pain. 03/04/22  Yes Meuth, Brooke A, PA-C  methocarbamol (ROBAXIN) 500 MG tablet Take 1 tablet (  500 mg total) by mouth every 6 (six) hours as needed for muscle spasms. 03/04/22  Yes Meuth, Brooke A, PA-C  ondansetron (ZOFRAN-ODT) 4 MG disintegrating tablet Dissolve 1 tablet (4 mg total) by mouth every 8 (eight) hours as needed for nausea for up to 7 days 03/07/22  Yes   Oxycodone HCl 10 MG TABS Take 0.5-1 tablets (5-10 mg total) by mouth every 6 (six) hours as needed. Patient taking differently: Take 5 mg by mouth every 6 (six) hours as needed (for pain). 03/04/22  Yes Meuth, Brooke A, PA-C  polyethylene glycol (MIRALAX / GLYCOLAX) 17 g packet Take 17 g by mouth daily as needed for mild constipation. 03/04/22  Yes Meuth, Brooke A, PA-C  buPROPion (WELLBUTRIN SR) 200 MG 12 hr tablet Take 1 tablet (200 mg total) by mouth 2 (two) times daily. Patient not taking: Reported on 03/01/2022 04/19/21 04/19/22  Flossie Buffy, NP  Liraglutide -Weight Management (SAXENDA) 18 MG/3ML SOPN Inject 2.37m under the skin daily Patient not taking: Reported on 03/01/2022 09/03/21   Nche, CCharlene Brooke NP  valACYclovir (VALTREX) 1000 MG tablet Take 2 tablets (2,000 mg total) by mouth 2 (two) times daily. Patient not taking: Reported on 03/07/2022 03/04/22   MMargie BilletA, PA-C    Scheduled Meds:  Infusions:  sodium chloride 125 mL/hr at 03/08/22 0800   PRN Meds: acetaminophen **OR** acetaminophen, HYDROmorphone (DILAUDID) injection, naLOXone (NARCAN)  injection, ondansetron (ZOFRAN) IV   Allergies as of 03/07/2022 - Review Complete 03/07/2022  Allergen Reaction Noted   Nickel Rash 03/01/2022    Family History  Adopted: Yes  Family history unknown: Yes    Social History   Socioeconomic History   Marital status: Divorced    Spouse name: Not on file   Number of children: 3   Years of education: Not on file   Highest education level: Not on file  Occupational History   Occupation: nChartered certified accountant Tobacco Use   Smoking status: Former    Packs/day: 0.50    Years: 11.00    Total pack years: 5.50    Types: Cigarettes    Quit date: 11/2021    Years since quitting: 0.3   Smokeless tobacco: Never  Vaping Use   Vaping Use: Never used  Substance and Sexual Activity   Alcohol use: Yes    Comment: social   Drug use: No   Sexual activity: Not Currently    Birth control/protection: None  Other Topics Concern   Not on file  Social History Narrative   Not on file   Social Determinants of Health   Financial Resource Strain: Not on file  Food Insecurity: Not on file  Transportation Needs: Not on file  Physical Activity: Not on file  Stress: Not on file  Social Connections: Not on file  Intimate Partner Violence: Not on file    REVIEW OF SYSTEMS: Constitutional: Slight fatigue but no profound weakness. ENT:  No nose bleeds Pulm: No shortness of breath but when she takes a deeper breath, it  triggers the epigastric pain. CV:  No palpitations, no LE edema.  No angina GU:  No hematuria, no frequency GI: See HPI Heme: Denies unusual or excessive bleeding or bruising. Transfusions: None Neuro:  No headaches, no peripheral tingling or numbness.  No dizziness or syncope. Derm:  No itching, no rash or sores.  Endocrine:  No sweats or chills.  No polyuria or dysuria Immunization: Reviewed. Travel: Not queried.   PHYSICAL EXAM: Vital signs in  last 24 hours: Vitals:   03/08/22 0730 03/08/22 0748  BP: 128/85   Pulse: 75   Resp: 16   Temp:  98.5 F (36.9 C)  SpO2: 95%    Wt Readings from Last 3 Encounters:  03/07/22 101.2 kg  03/03/22 101.3 kg  06/30/21 107.9 kg    General: Pleasant, cooperative, does not look ill.  Resting comfortably on stretcher in ED. Head: No facial asymmetry or swelling.  No signs of head trauma. Eyes: No scleral icterus Ears: No hearing deficit Nose: No discharge Mouth: Good dentition.  Mucosa is moist and clear.  Tongue midline. Neck: No JVD Lungs: Clear bilaterally with quiet, unlabored breathing.  No cough Heart: RRR.  No MRG.  S1, S2 present. Abdomen: Obese, soft.  Bowel sounds hypoactive but none are high-pitched or tympanitic.  Generalized very mild tenderness but more pronounced tenderness to at the epigastrium without guarding or rebound.  All surgical incisions are intact with glue and display no erythema or discharge..   Rectal: Deferred Musc/Skeltl: No joint redness, swelling, gross deformity Extremities: No CCE. Neurologic: Alert.  Oriented x3.  Moves all 4 limbs without difficulty.  Formal strength testing not performed. Skin: No jaundice.  No rash, no suspicious lesions. Tattoos: Several professional grade tattoos on trunk and upper limbs. Nodes: No cervical adenopathy Psych: Pleasant, calm, cooperative.  Fluid speech.  Intake/Output from previous day: No intake/output data recorded. Intake/Output this shift: Total I/O In:  1000 [IV Piggyback:1000] Out: -   LAB RESULTS: Recent Labs    03/07/22 1443 03/08/22 0338  WBC 8.4 7.3  HGB 12.8 11.8*  HCT 40.7 37.3  PLT 379 322   BMET Lab Results  Component Value Date   NA 138 03/08/2022   NA 139 03/07/2022   NA 137 03/03/2022   K 3.9 03/08/2022   K 4.1 03/07/2022   K 3.8 03/03/2022   CL 106 03/08/2022   CL 106 03/07/2022   CL 107 03/03/2022   CO2 24 03/08/2022   CO2 23 03/07/2022   CO2 25 03/03/2022   GLUCOSE 98 03/08/2022   GLUCOSE 113 (H) 03/07/2022   GLUCOSE 166 (H) 03/03/2022   BUN 5 (L) 03/08/2022   BUN 5 (L) 03/07/2022   BUN 6 03/03/2022   CREATININE 0.87 03/08/2022   CREATININE 0.88 03/07/2022   CREATININE 0.88 03/03/2022   CALCIUM 8.5 (L) 03/08/2022   CALCIUM 9.2 03/07/2022   CALCIUM 8.5 (L) 03/03/2022   LFT Recent Labs    03/07/22 1443 03/08/22 0338  PROT 7.1 6.1*  ALBUMIN 3.6 3.1*  AST 463* 205*  ALT 525* 375*  ALKPHOS 475* 361*  BILITOT 1.2 0.8   PT/INR Lab Results  Component Value Date   INR 1.1 03/08/2022   Hepatitis Panel No results for input(s): "HEPBSAG", "HCVAB", "HEPAIGM", "HEPBIGM" in the last 72 hours. C-Diff No components found for: "CDIFF" Lipase     Component Value Date/Time   LIPASE 24 03/07/2022 1443    Drugs of Abuse  No results found for: "LABOPIA", "COCAINSCRNUR", "LABBENZ", "AMPHETMU", "THCU", "LABBARB"   RADIOLOGY STUDIES: CT ABDOMEN PELVIS W CONTRAST  Result Date: 03/08/2022 CLINICAL DATA:  Right upper quadrant pain, status post cholecystectomy EXAM: CT ABDOMEN AND PELVIS WITH CONTRAST TECHNIQUE: Multidetector CT imaging of the abdomen and pelvis was performed using the standard protocol following bolus administration of intravenous contrast. RADIATION DOSE REDUCTION: This exam was performed according to the departmental dose-optimization program which includes automated exposure control, adjustment of the mA and/or kV according to patient  size and/or use of iterative reconstruction  technique. CONTRAST:  122m OMNIPAQUE IOHEXOL 300 MG/ML  SOLN COMPARISON:  MRI abdomen dated 03/01/2022 FINDINGS: Lower chest: Lung bases are clear. Hepatobiliary: Liver is within normal limits. Status post cholecystectomy. Trace fluid in the surgical bed (series 3/image 24), postoperative. No findings suspicious for bile leak. Mild central intrahepatic ductal dilatation. Dilated common duct, measuring 12 mm (coronal image 46). While poorly visualized on CT, a 6 mm distal CBD stone is suspected just above the ampulla (series 3/image 26). Pancreas: Within normal limits. Spleen: Within normal limits. Adrenals/Urinary Tract: Adrenal glands are within normal limits. Kidneys are within normal limits.  No hydronephrosis. Bladder is within normal limits. Stomach/Bowel: Stomach is within normal limits. No evidence of bowel obstruction. Normal appendix (series 3/image 47). No colonic wall thickening or inflammatory changes. Vascular/Lymphatic: No evidence of abdominal aortic aneurysm. No suspicious abdominopelvic lymphadenopathy. Reproductive: Uterus and bilateral ovaries are within normal limits. Other: No abdominopelvic ascites. Postsurgical changes along the midline anterior abdominal wall. Musculoskeletal: Mild degenerative changes at L5-S1. IMPRESSION: Status post cholecystectomy. Suspected 6 mm distal CBD stone just above the ampulla. Associated mild central intrahepatic and extrahepatic ductal dilatation. ERCP is suggested. Electronically Signed   By: SJulian HyM.D.   On: 03/08/2022 00:32     IMPRESSION:   Lap chole 8/23.  Unable to perform intraoperative cholangiogram but preop ultrasound and MRCP did not display evidence for CBD stone.  Now represents nearly 1 week following surgery with suspicion for CBD stone.    PLAN:       ERCP. looks like timing will need to be tomorrow at 1 PM as anesthesia availability limited today.  Risks of bleeding, inability to remove stone, pancreatitis discussed  with patient.  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 preoperative antibiotic ordered for tomorrow but as she currently has no fever and no white count, have not added antibiotics today.  May have clear liquids and n.p.o. after midnight.   SAzucena Freed 03/08/2022, 8:16 AM Phone [phone]  I have taken a history, reviewed the chart and examined the patient. I performed a substantive portion of this encounter, including complete performance of at least one of the key components, in conjunction with the APP. I agree with the APP's note, impression and recommendations except that we will proceed with MRCP prior to ERCP.  36y/o fm with recent cholecystectomy for cholecystitis, initially recovered well, but then experienced recurrent pain, identical to her index gallstone pain.  Liver enzymes with new elevation in AST, ALT, ALP but normal tbili.  CT with dilated CBD and suspected stone at level of ampulla. Patient still experiencing pain and nausea which are controlled with medications.   A retained stone is very likely based on her symptoms, but with a normal tbili and no definitive stone on imaging, an MRCP is recommended prior to proceeding with an ERCP.  Will plan for MRCP today, tentative ERCP with Dr. GLyndel Safe   Plan was discussed with Dr. GLyndel Safe  Avery Klingbeil E. CCandis Schatz MD LMoh[facility] CenterGastroenterology

## 2022-03-08 NOTE — ED Notes (Signed)
ED TO INPATIENT HANDOFF REPORT  ED Nurse Name and Phone #: [phone]  S Name/Age/Gender Brenda Navarro 36 y.o. female Room/Bed: 008C/008C  Code Status   Code Status: Full Code  Home/SNF/Other Home Patient oriented to: self, place, time, and situation Is this baseline? Yes   Triage Complete: Triage complete  Chief Complaint Common bile duct calculus [K80.50]  Triage Note Was here last Monday and had gallbladder surgery but this am started having pain again in RUQ.  +nausea.     Allergies Allergies  Allergen Reactions   Nickel Rash    Blistering rash with prolonged contact    Level of Care/Admitting Diagnosis ED Disposition     ED Disposition  Admit   Condition  --   Comment  Hospital Area: [facility] [[id number]]  Level of Care: Telemetry Medical [104]  May admit patient to [facility] or [facility] if equivalent level of care is available:: No  Covid Evaluation: Asymptomatic - no recent exposure (last 10 days) testing not required  Diagnosis: Common bile duct calculus [967893]  Admitting Physician: Angie Fava [[id number]]  Attending Physician: Angie Fava [[id number]]  Certification:: I certify this patient will need inpatient services for at least 2 midnights  Estimated Length of Stay: 2          B Medical/Surgery History Past Medical History:  Diagnosis Date   Abnormal Pap smear    Back pain    Depression    Obesity    Uterine perforation by intrauterine contraceptive device 2017   Past Surgical History:  Procedure Laterality Date   ABDOMINAL SURGERY  2017   iud perforated uterus, had to remove   CHOLECYSTECTOMY N/A 03/02/2022   Procedure: LAPAROSCOPIC CHOLECYSTECTOMY;  Surgeon: Gaynelle Adu, MD;  Location: [facility] OR;  Service: General;  Laterality: N/A;   COLPOSCOPY  2011     A IV Location/Drains/Wounds Patient Lines/Drains/Airways Status     Active Line/Drains/Airways     Name Placement date Placement time Site Days    Peripheral IV 03/07/22 18 G Right Antecubital 03/07/22  2224  Antecubital  1   Incision (Closed) 03/02/22 Abdomen Other (Comment) 03/02/22  1054  -- 6   Incision - 4 Ports Abdomen 1: Umbilicus 2: Upper 3: Right 4: Right;Lower 03/02/22  1047  -- 6            Intake/Output Last 24 hours  Intake/Output Summary (Last 24 hours) at 03/08/2022 1204 Last data filed at 03/08/2022 0748 Gross per 24 hour  Intake 1000 ml  Output --  Net 1000 ml    Labs/Imaging Results for orders placed or performed during the hospital encounter of 03/07/22 (from the past 48 hour(s))  CBC with Differential     Status: Abnormal   Collection Time: 03/07/22  2:43 PM  Result Value Ref Range   WBC 8.4 4.0 - 10.5 K/uL   RBC 4.86 3.87 - 5.11 MIL/uL   Hemoglobin 12.8 12.0 - 15.0 g/dL   HCT 78.2 42.3 - 53.6 %   MCV 83.7 80.0 - 100.0 fL   MCH 26.3 26.0 - 34.0 pg   MCHC 31.4 30.0 - 36.0 g/dL   RDW 14.4 (H) 31.5 - 40.0 %   Platelets 379 150 - 400 K/uL   nRBC 0.0 0.0 - 0.2 %   Neutrophils Relative % 59 %   Neutro Abs 4.9 1.7 - 7.7 K/uL   Lymphocytes Relative 31 %   Lymphs Abs 2.6 0.7 - 4.0 K/uL   Monocytes  Relative 9 %   Monocytes Absolute 0.7 0.1 - 1.0 K/uL   Eosinophils Relative 1 %   Eosinophils Absolute 0.1 0.0 - 0.5 K/uL   Basophils Relative 0 %   Basophils Absolute 0.0 0.0 - 0.1 K/uL   Immature Granulocytes 0 %   Abs Immature Granulocytes 0.03 0.00 - 0.07 K/uL    Comment: Performed at [facility], [address], [place], [place] [address]  Comprehensive metabolic panel     Status: Abnormal   Collection Time: 03/07/22  2:43 PM  Result Value Ref Range   Sodium 139 135 - 145 mmol/L   Potassium 4.1 3.5 - 5.1 mmol/L   Chloride 106 98 - 111 mmol/L   CO2 23 22 - 32 mmol/L   Glucose, Bld 113 (H) 70 - 99 mg/dL    Comment: Glucose reference range applies only to samples taken after fasting for at least 8 hours.   BUN 5 (L) 6 - 20 mg/dL   Creatinine, Ser 6.07 0.44 - 1.00 mg/dL   Calcium 9.2 8.9 -  37.1 mg/dL   Total Protein 7.1 6.5 - 8.1 g/dL   Albumin 3.6 3.5 - 5.0 g/dL   AST 062 (H) 15 - 41 U/L   ALT 525 (H) 0 - 44 U/L   Alkaline Phosphatase 475 (H) 38 - 126 U/L   Total Bilirubin 1.2 0.3 - 1.2 mg/dL   GFR, Estimated >69 >48 mL/min    Comment: (NOTE) Calculated using the CKD-EPI Creatinine Equation (2021)    Anion gap 10 5 - 15    Comment: Performed at [facility], [address], [place], [place] [address]  Lipase, blood     Status: None   Collection Time: 03/07/22  2:43 PM  Result Value Ref Range   Lipase 24 11 - 51 U/L    Comment: Performed at [facility], [address] [place], [place] [address]  Urinalysis, Routine w reflex microscopic     Status: Abnormal   Collection Time: 03/07/22  9:10 PM  Result Value Ref Range   Color, Urine AMBER (A) YELLOW    Comment: BIOCHEMICALS MAY BE AFFECTED BY COLOR   APPearance HAZY (A) CLEAR   Specific Gravity, Urine 1.023 1.005 - 1.030   pH 5.0 5.0 - 8.0   Glucose, UA NEGATIVE NEGATIVE mg/dL   Hgb urine dipstick NEGATIVE NEGATIVE   Bilirubin Urine NEGATIVE NEGATIVE   Ketones, ur NEGATIVE NEGATIVE mg/dL   Protein, ur NEGATIVE NEGATIVE mg/dL   Nitrite NEGATIVE NEGATIVE   Leukocytes,Ua NEGATIVE NEGATIVE    Comment: Performed at [facility], [address], [place], [place] [address]  I-Stat beta hCG blood, ED     Status: None   Collection Time: 03/07/22 10:30 PM  Result Value Ref Range   I-stat hCG, quantitative <5.0 <5 mIU/mL   Comment 3            Comment:   GEST. AGE      CONC.  (mIU/mL)   <=1 WEEK        5 - 50     2 WEEKS       50 - 500     3 WEEKS       100 - 10,000     4 WEEKS     1,000 - 30,000        FEMALE AND NON-PREGNANT FEMALE:     LESS THAN 5 mIU/mL   CBC with Differential/Platelet  Status: Abnormal   Collection Time: 03/08/22  3:38 AM  Result Value Ref Range   WBC 7.3 4.0 - 10.5 K/uL   RBC 4.41 3.87 - 5.11 MIL/uL   Hemoglobin 11.8 (L) 12.0 - 15.0 g/dL   HCT 82.9 56.2 - 13.0 %    MCV 84.6 80.0 - 100.0 fL   MCH 26.8 26.0 - 34.0 pg   MCHC 31.6 30.0 - 36.0 g/dL   RDW 86.5 (H) 78.4 - 69.6 %   Platelets 322 150 - 400 K/uL   nRBC 0.0 0.0 - 0.2 %   Neutrophils Relative % 61 %   Neutro Abs 4.4 1.7 - 7.7 K/uL   Lymphocytes Relative 29 %   Lymphs Abs 2.1 0.7 - 4.0 K/uL   Monocytes Relative 8 %   Monocytes Absolute 0.6 0.1 - 1.0 K/uL   Eosinophils Relative 1 %   Eosinophils Absolute 0.1 0.0 - 0.5 K/uL   Basophils Relative 1 %   Basophils Absolute 0.0 0.0 - 0.1 K/uL   Immature Granulocytes 0 %   Abs Immature Granulocytes 0.03 0.00 - 0.07 K/uL    Comment: Performed at [facility], [address] [place], [place] [address]  Comprehensive metabolic panel     Status: Abnormal   Collection Time: 03/08/22  3:38 AM  Result Value Ref Range   Sodium 138 135 - 145 mmol/L   Potassium 3.9 3.5 - 5.1 mmol/L   Chloride 106 98 - 111 mmol/L   CO2 24 22 - 32 mmol/L   Glucose, Bld 98 70 - 99 mg/dL    Comment: Glucose reference range applies only to samples taken after fasting for at least 8 hours.   BUN 5 (L) 6 - 20 mg/dL   Creatinine, Ser 4.13 0.44 - 1.00 mg/dL   Calcium 8.5 (L) 8.9 - 10.3 mg/dL   Total Protein 6.1 (L) 6.5 - 8.1 g/dL   Albumin 3.1 (L) 3.5 - 5.0 g/dL   AST 244 (H) 15 - 41 U/L   ALT 375 (H) 0 - 44 U/L   Alkaline Phosphatase 361 (H) 38 - 126 U/L   Total Bilirubin 0.8 0.3 - 1.2 mg/dL   GFR, Estimated >01 >02 mL/min    Comment: (NOTE) Calculated using the CKD-EPI Creatinine Equation (2021)    Anion gap 8 5 - 15    Comment: Performed at [facility], [address] [place], [place] [address]  Magnesium     Status: None   Collection Time: 03/08/22  3:38 AM  Result Value Ref Range   Magnesium 2.0 1.7 - 2.4 mg/dL    Comment: Performed at [facility], [address], [place], [place] [address]  Protime-INR     Status: None   Collection Time: 03/08/22  3:38 AM  Result Value Ref Range   Prothrombin Time 13.7 11.4 - 15.2 seconds   INR 1.1 0.8 -  1.2    Comment: (NOTE) INR goal varies based on device and disease states. Performed at [facility], [address], [place], [place] [address]    CT ABDOMEN PELVIS W CONTRAST  Result Date: 03/08/2022 CLINICAL DATA:  Right upper quadrant pain, status post cholecystectomy EXAM: CT ABDOMEN AND PELVIS WITH CONTRAST TECHNIQUE: Multidetector CT imaging of the abdomen and pelvis was performed using the standard protocol following bolus administration of intravenous contrast. RADIATION DOSE REDUCTION: This exam was performed according to the departmental dose-optimization program which includes automated exposure control, adjustment of the  mA and/or kV according to patient size and/or use of iterative reconstruction technique. CONTRAST:  OMNIPAQUE IOHEXOL 300 MG/ML  SOLN COMPARISON:  MRI abdomen dated 03/01/2022 FINDINGS: Lower chest: Lung bases are clear. Hepatobiliary: Liver is within normal limits. Status post cholecystectomy. Trace fluid in the surgical bed (series 3/image 24), postoperative. No findings suspicious for bile leak. Mild central intrahepatic ductal dilatation. Dilated common duct, measuring 12 mm (coronal image 46). While poorly visualized on CT, a 6 mm distal CBD stone is suspected just above the ampulla (series 3/image 26). Pancreas: Within normal limits. Spleen: Within normal limits. Adrenals/Urinary Tract: Adrenal glands are within normal limits. Kidneys are within normal limits.  No hydronephrosis. Bladder is within normal limits. Stomach/Bowel: Stomach is within normal limits. No evidence of bowel obstruction. Normal appendix (series 3/image 47). No colonic wall thickening or inflammatory changes. Vascular/Lymphatic: No evidence of abdominal aortic aneurysm. No suspicious abdominopelvic lymphadenopathy. Reproductive: Uterus and bilateral ovaries are within normal limits. Other: No abdominopelvic ascites. Postsurgical changes along the midline anterior abdominal wall.  Musculoskeletal: Mild degenerative changes at L5-S1. IMPRESSION: Status post cholecystectomy. Suspected 6 mm distal CBD stone just above the ampulla. Associated mild central intrahepatic and extrahepatic ductal dilatation. ERCP is suggested. Electronically Signed   By: Charline Bills M.D.   On: 03/08/2022 00:32    Pending Labs Unresulted Labs (From admission, onward)    None       Vitals/Pain Today's Vitals   03/08/22 0930 03/08/22 1045 03/08/22 1203 03/08/22 1203  BP: 138/64 137/83    Pulse: 91 80    Resp: 16 16    Temp:    98.4 F (36.9 C)  TempSrc:    Oral  SpO2: 99% 96%    Weight:      Height:      PainSc:   5      Isolation Precautions No active isolations  Medications Medications  0.9 %  sodium chloride infusion ( Intravenous New Bag/Given 03/08/22 0800)  naloxone [facility]) injection 0.4 mg (has no administration in time range)  HYDROmorphone (DILAUDID) injection 0.5 mg (0.5 mg Intravenous Given 03/08/22 1036)  ondansetron (ZOFRAN) injection 4 mg (4 mg Intravenous Given 03/08/22 1036)  senna-docusate (Senokot-S) tablet 1 tablet (1 tablet Oral Given 03/08/22 1036)  polyethylene glycol (MIRALAX / GLYCOLAX) packet 17 g (has no administration in time range)  oxyCODONE (Oxy IR/ROXICODONE) immediate release tablet 5 mg (5 mg Oral Given 03/08/22 1036)  oxyCODONE-acetaminophen (PERCOCET/ROXICET) 5-325 MG per tablet 1 tablet (1 tablet Oral Given 03/07/22 1451)  ondansetron (ZOFRAN-ODT) disintegrating tablet 4 mg (4 mg Oral Given 03/07/22 1451)  oxyCODONE-acetaminophen (PERCOCET/ROXICET) 5-325 MG per tablet 1 tablet (1 tablet Oral Given 03/07/22 1844)  sodium chloride 0.9 % bolus 1,000 mL (0 mLs Intravenous Stopped 03/08/22 0748)  HYDROmorphone (DILAUDID) injection 0.5 mg (0.5 mg Intravenous Given 03/07/22 2225)  iohexol (OMNIPAQUE) 300 MG/ML solution 100 mL (100 mLs Intravenous Contrast Given 03/08/22 0009)  morphine (PF) 4 MG/ML injection 4 mg (4 mg Intravenous Given 03/08/22 0052)     Mobility walks Low fall risk   Focused Assessments     R Recommendations: See Admitting Provider Note  Report given to:   Additional Notes:

## 2022-03-09 ENCOUNTER — Telehealth: Payer: Self-pay

## 2022-03-09 ENCOUNTER — Other Ambulatory Visit: Payer: Self-pay

## 2022-03-09 DIAGNOSIS — K8050 Calculus of bile duct without cholangitis or cholecystitis without obstruction: Secondary | ICD-10-CM

## 2022-03-09 LAB — COMPREHENSIVE METABOLIC PANEL
ALT: 258 U/L — ABNORMAL HIGH (ref 0–44)
AST: 86 U/L — ABNORMAL HIGH (ref 15–41)
Albumin: 3.1 g/dL — ABNORMAL LOW (ref 3.5–5.0)
Alkaline Phosphatase: 264 U/L — ABNORMAL HIGH (ref 38–126)
Anion gap: 13 (ref 5–15)
BUN: <5 mg/dL — ABNORMAL LOW (ref 6–20)
CO2: 22 mmol/L (ref 22–32)
Calcium: 9.1 mg/dL (ref 8.9–10.3)
Chloride: 104 mmol/L (ref 98–111)
Creatinine, Ser: 0.81 mg/dL (ref 0.44–1.00)
GFR, Estimated: >60 mL/min (ref >60)
Glucose, Bld: 107 mg/dL — ABNORMAL HIGH (ref 70–99)
Potassium: 4.1 mmol/L (ref 3.5–5.1)
Sodium: 139 mmol/L (ref 135–145)
Total Bilirubin: 0.6 mg/dL (ref 0.3–1.2)
Total Protein: 5.9 g/dL — ABNORMAL LOW (ref 6.5–8.1)

## 2022-03-09 LAB — CBC
HCT: 35.3 % — ABNORMAL LOW (ref 36.0–46.0)
Hemoglobin: 11 g/dL — ABNORMAL LOW (ref 12.0–15.0)
MCH: 26.6 pg (ref 26.0–34.0)
MCHC: 31.2 g/dL (ref 30.0–36.0)
MCV: 85.3 fL (ref 80.0–100.0)
Platelets: 332 10*3/uL (ref 150–400)
RBC: 4.14 MIL/uL (ref 3.87–5.11)
RDW: 16.1 % — ABNORMAL HIGH (ref 11.5–15.5)
WBC: 6.9 10*3/uL (ref 4.0–10.5)
nRBC: 0 % (ref 0.0–0.2)

## 2022-03-09 SURGERY — ENDOSCOPIC RETROGRADE CHOLANGIOPANCREATOGRAPHY (ERCP) WITH PROPOFOL
Anesthesia: General

## 2022-03-09 NOTE — Progress Notes (Signed)
[facility] ROUNDING NOTE   Subjective: Patient feeling much better today.  Mild pain, minimal nausea.  Tolerating regular diet without worsening of symptoms.  Liver enzymes downtrending   Objective: Vital signs in last 24 hours: Temp:  [98.4 F (36.9 C)-99 F (37.2 C)] 98.9 F (37.2 C) (08/30 0811) Pulse Rate:  [80-97] 97 (08/30 0811) Resp:  [16-17] 17 (08/30 0811) BP: (111-143)/(53-98) 125/67 (08/30 0811) SpO2:  [95 %-100 %] 95 % (08/30 0811) Weight:  [998 kg] 114 kg (08/30 0500)   General: NAD Lungs:  CTA b/l, no w/r/r Heart:  RRR, no m/r/g Abdomen:  Soft, NT, ND, +BS Ext:  No c/c/e    Intake/Output from previous day: 08/29 0701 - 08/30 0700 In: 2205 [P.O.:120; I.V.:1085; IV Piggyback:1000] Out: -  Intake/Output this shift: Total I/O In: 360 [P.O.:360] Out: -    Lab Results: Recent Labs    03/07/22 1443 03/08/22 0338 03/09/22 0322  WBC 8.4 7.3 6.9  HGB 12.8 11.8* 11.0*  PLT 379 322 332  MCV 83.7 84.6 85.3   BMET Recent Labs    03/07/22 1443 03/08/22 0338 03/09/22 0322  NA 139 138 139  K 4.1 3.9 4.1  CL 106 106 104  CO2 23 24 22   GLUCOSE 113* 98 107*  BUN 5* 5* <5*  CREATININE 0.88 0.87 0.81  CALCIUM 9.2 8.5* 9.1   LFT Recent Labs    03/07/22 1443 03/08/22 0338 03/09/22 0322  PROT 7.1 6.1* 5.9*  ALBUMIN 3.6 3.1* 3.1*  AST 463* 205* 86*  ALT 525* 375* 258*  ALKPHOS 475* 361* 264*  BILITOT 1.2 0.8 0.6   PT/INR Recent Labs    03/08/22 0338  INR 1.1      Imaging/Other results: MR ABDOMEN MRCP W WO CONTAST  Result Date: 03/08/2022 CLINICAL DATA:  Concern for biliary obstruction. Recent laparoscopic cholecystectomy. EXAM: MRI ABDOMEN WITHOUT AND WITH CONTRAST (INCLUDING MRCP) TECHNIQUE: Multiplanar multisequence MR imaging of the abdomen was performed both before and after the administration of intravenous contrast. Heavily T2-weighted images of the biliary and pancreatic ducts were obtained, and three-dimensional MRCP  images were rendered by post processing. CONTRAST:  16mL GADAVIST GADOBUTROL 1 MMOL/ML IV SOLN COMPARISON:  None Available. FINDINGS: Lower chest:  Lung bases are clear. Hepatobiliary: No intrahepatic biliary duct dilatation. Common hepatic duct measures 11 mm. Common bile duct measures 8 mm (image 18/series 5). There is no filling defect within the common bile duct. Common bile duct tapers normally to the ampulla (image 17/series 5). Patient status post cholecystectomy. No abnormal fluid collections within the gallbladder fossa. Pancreas: Normal pancreatic parenchymal intensity. No ductal dilatation or inflammation. Spleen: Normal spleen. Adrenals/urinary tract: Adrenal glands and kidneys are normal. Stomach/Bowel: Stomach and limited of the small bowel is unremarkable Vascular/Lymphatic: Abdominal aortic normal caliber. No retroperitoneal periportal lymphadenopathy. Musculoskeletal: No aggressive osseous lesion IMPRESSION: 1. No choledocholithiasis. Mild dilatation of the common bile duct following cholecystectomy without obstructing lesion. 2. No intrahepatic biliary duct dilatation. 3. No complication following cholecystectomy. 4. No pancreatitis or pancreatic duct dilatation. Electronically Signed   By: 9m M.D.   On: 03/08/2022 16:22   MR 3D Recon At Scanner  Result Date: 03/08/2022 CLINICAL DATA:  Concern for biliary obstruction. Recent laparoscopic cholecystectomy. EXAM: MRI ABDOMEN WITHOUT AND WITH CONTRAST (INCLUDING MRCP) TECHNIQUE: Multiplanar multisequence MR imaging of the abdomen was performed both before and after the administration of intravenous contrast. Heavily T2-weighted images of the biliary and pancreatic ducts were obtained, and three-dimensional MRCP images were  rendered by post processing. CONTRAST:  46mL GADAVIST GADOBUTROL 1 MMOL/ML IV SOLN COMPARISON:  None Available. FINDINGS: Lower chest:  Lung bases are clear. Hepatobiliary: No intrahepatic biliary duct dilatation.  Common hepatic duct measures 11 mm. Common bile duct measures 8 mm (image 18/series 5). There is no filling defect within the common bile duct. Common bile duct tapers normally to the ampulla (image 17/series 5). Patient status post cholecystectomy. No abnormal fluid collections within the gallbladder fossa. Pancreas: Normal pancreatic parenchymal intensity. No ductal dilatation or inflammation. Spleen: Normal spleen. Adrenals/urinary tract: Adrenal glands and kidneys are normal. Stomach/Bowel: Stomach and limited of the small bowel is unremarkable Vascular/Lymphatic: Abdominal aortic normal caliber. No retroperitoneal periportal lymphadenopathy. Musculoskeletal: No aggressive osseous lesion IMPRESSION: 1. No choledocholithiasis. Mild dilatation of the common bile duct following cholecystectomy without obstructing lesion. 2. No intrahepatic biliary duct dilatation. 3. No complication following cholecystectomy. 4. No pancreatitis or pancreatic duct dilatation. Electronically Signed   By: Genevive Bi M.D.   On: 03/08/2022 16:22   CT ABDOMEN PELVIS W CONTRAST  Result Date: 03/08/2022 CLINICAL DATA:  Right upper quadrant pain, status post cholecystectomy EXAM: CT ABDOMEN AND PELVIS WITH CONTRAST TECHNIQUE: Multidetector CT imaging of the abdomen and pelvis was performed using the standard protocol following bolus administration of intravenous contrast. RADIATION DOSE REDUCTION: This exam was performed according to the departmental dose-optimization program which includes automated exposure control, adjustment of the mA and/or kV according to patient size and/or use of iterative reconstruction technique. CONTRAST:  OMNIPAQUE IOHEXOL 300 MG/ML  SOLN COMPARISON:  MRI abdomen dated 03/01/2022 FINDINGS: Lower chest: Lung bases are clear. Hepatobiliary: Liver is within normal limits. Status post cholecystectomy. Trace fluid in the surgical bed (series 3/image 24), postoperative. No findings suspicious for bile  leak. Mild central intrahepatic ductal dilatation. Dilated common duct, measuring 12 mm (coronal image 46). While poorly visualized on CT, a 6 mm distal CBD stone is suspected just above the ampulla (series 3/image 26). Pancreas: Within normal limits. Spleen: Within normal limits. Adrenals/Urinary Tract: Adrenal glands are within normal limits. Kidneys are within normal limits.  No hydronephrosis. Bladder is within normal limits. Stomach/Bowel: Stomach is within normal limits. No evidence of bowel obstruction. Normal appendix (series 3/image 47). No colonic wall thickening or inflammatory changes. Vascular/Lymphatic: No evidence of abdominal aortic aneurysm. No suspicious abdominopelvic lymphadenopathy. Reproductive: Uterus and bilateral ovaries are within normal limits. Other: No abdominopelvic ascites. Postsurgical changes along the midline anterior abdominal wall. Musculoskeletal: Mild degenerative changes at L5-S1. IMPRESSION: Status post cholecystectomy. Suspected 6 mm distal CBD stone just above the ampulla. Associated mild central intrahepatic and extrahepatic ductal dilatation. ERCP is suggested. Electronically Signed   By: Charline Bills M.D.   On: 03/08/2022 00:32      Assessment and Plan:  36 year old female admitted with what was most likely a retained stone following her cholecystectomy last week.  The stone appears to have passed spontaneously based on the negative MRCP, downtrending liver enzymes and improvement in symptoms.  Will plan to check liver enzymes as outpatient next week.  No additional GI follow up is necessary, unless patient has recurrent symptoms. Ok to discharge home from GI standpoint.  Choledocholithiasis, resolved spontaneously - Discharge home today - Recheck liver enzymes in 1 week    [name], MD  03/09/2022, 9:25 AM [facility]

## 2022-03-09 NOTE — Discharge Summary (Signed)
Physician Discharge Summary  Brenda Navarro JKK:[id number] DOB: 19-Jul-1985 DOA: 03/07/2022  PCP: Pcp, No  Admit date: 03/07/2022 Discharge date: 03/09/2022  Admitted From: Home Disposition: Home  Recommendations for Outpatient Follow-up:  Follow up with PCP in 1-2 weeks Follow-up with GI as scheduled  Home Health: None Equipment/Devices: None  Discharge Condition: Stable CODE STATUS: Full Diet recommendation: Low-salt low-fat bland diet  Brief/Interim Summary: Brenda Navarro is a 36 y.o. female with medical history significant for acute cholecystitis status post recent laparoscopic cholecystectomy, who is admitted to [facility] on 03/07/2022 with obstructing choledocholithiasis after presenting from home to [facility] ED complaining of abdominal pain.  Patient admitted as above with suspected postoperative obstructive choledocholithiasis.  Elevated LFTs and abdominal pain with nausea vomiting postprandially, clinically consistent with choledocholithiasis.  In the interim patient was planned for MRCP to confirm diagnosis with subsequent ERCP.  MRCP confirmed no stone, likely passed prior to imaging with downtrending labs.  At this time no indication for ERCP or further intervention.  GI to follow-up in the outpatient setting.  Given ongoing improvement, tolerance of p.o. with no further abdominal pain or nausea or vomiting patient is otherwise stable and agreeable for discharge home.  Discharge Diagnoses:  Principal Problem:   Common bile duct calculus Active Problems:   Dehydration   Right upper quadrant abdominal pain  Discharge Instructions   Allergies as of 03/09/2022       Reactions   Nickel Rash   Blistering rash with prolonged contact        Medication List     STOP taking these medications    buPROPion 200 MG 12 hr tablet Commonly known as: WELLBUTRIN SR   Saxenda 18 MG/3ML Sopn Generic drug: Liraglutide -Weight Management   valACYclovir 1000 MG  tablet Commonly known as: VALTREX       TAKE these medications    acetaminophen 500 MG tablet Commonly known as: TYLENOL Take 2 tablets (1,000 mg total) by mouth every 6 (six) hours as needed for mild pain.   ibuprofen 600 MG tablet Commonly known as: ADVIL Take 1 tablet (600 mg total) by mouth every 6 (six) hours as needed for moderate pain.   methocarbamol 500 MG tablet Commonly known as: ROBAXIN Take 1 tablet (500 mg total) by mouth every 6 (six) hours as needed for muscle spasms.   ondansetron 4 MG disintegrating tablet Commonly known as: ZOFRAN-ODT Dissolve 1 tablet (4 mg total) by mouth every 8 (eight) hours as needed for nausea for up to 7 days   Oxycodone HCl 10 MG Tabs Take 0.5-1 tablets (5-10 mg total) by mouth every 6 (six) hours as needed. What changed:  how much to take reasons to take this   polyethylene glycol 17 g packet Commonly known as: MIRALAX / GLYCOLAX Take 17 g by mouth daily as needed for mild constipation.        Allergies  Allergen Reactions   Nickel Rash    Blistering rash with prolonged contact    Consultations: GI  Procedures/Studies: MR ABDOMEN MRCP W WO CONTAST  Result Date: 03/08/2022 CLINICAL DATA:  Concern for biliary obstruction. Recent laparoscopic cholecystectomy. EXAM: MRI ABDOMEN WITHOUT AND WITH CONTRAST (INCLUDING MRCP) TECHNIQUE: Multiplanar multisequence MR imaging of the abdomen was performed both before and after the administration of intravenous contrast. Heavily T2-weighted images of the biliary and pancreatic ducts were obtained, and three-dimensional MRCP images were rendered by post processing. CONTRAST:  80mL GADAVIST GADOBUTROL 1 MMOL/ML IV SOLN COMPARISON:  None Available.  FINDINGS: Lower chest:  Lung bases are clear. Hepatobiliary: No intrahepatic biliary duct dilatation. Common hepatic duct measures 11 mm. Common bile duct measures 8 mm (image 18/series 5). There is no filling defect within the common bile duct.  Common bile duct tapers normally to the ampulla (image 17/series 5). Patient status post cholecystectomy. No abnormal fluid collections within the gallbladder fossa. Pancreas: Normal pancreatic parenchymal intensity. No ductal dilatation or inflammation. Spleen: Normal spleen. Adrenals/urinary tract: Adrenal glands and kidneys are normal. Stomach/Bowel: Stomach and limited of the small bowel is unremarkable Vascular/Lymphatic: Abdominal aortic normal caliber. No retroperitoneal periportal lymphadenopathy. Musculoskeletal: No aggressive osseous lesion IMPRESSION: 1. No choledocholithiasis. Mild dilatation of the common bile duct following cholecystectomy without obstructing lesion. 2. No intrahepatic biliary duct dilatation. 3. No complication following cholecystectomy. 4. No pancreatitis or pancreatic duct dilatation. Electronically Signed   By: Genevive Bi M.D.   On: 03/08/2022 16:22   MR 3D Recon At Scanner  Result Date: 03/08/2022 CLINICAL DATA:  Concern for biliary obstruction. Recent laparoscopic cholecystectomy. EXAM: MRI ABDOMEN WITHOUT AND WITH CONTRAST (INCLUDING MRCP) TECHNIQUE: Multiplanar multisequence MR imaging of the abdomen was performed both before and after the administration of intravenous contrast. Heavily T2-weighted images of the biliary and pancreatic ducts were obtained, and three-dimensional MRCP images were rendered by post processing. CONTRAST:  82mL GADAVIST GADOBUTROL 1 MMOL/ML IV SOLN COMPARISON:  None Available. FINDINGS: Lower chest:  Lung bases are clear. Hepatobiliary: No intrahepatic biliary duct dilatation. Common hepatic duct measures 11 mm. Common bile duct measures 8 mm (image 18/series 5). There is no filling defect within the common bile duct. Common bile duct tapers normally to the ampulla (image 17/series 5). Patient status post cholecystectomy. No abnormal fluid collections within the gallbladder fossa. Pancreas: Normal pancreatic parenchymal intensity. No ductal  dilatation or inflammation. Spleen: Normal spleen. Adrenals/urinary tract: Adrenal glands and kidneys are normal. Stomach/Bowel: Stomach and limited of the small bowel is unremarkable Vascular/Lymphatic: Abdominal aortic normal caliber. No retroperitoneal periportal lymphadenopathy. Musculoskeletal: No aggressive osseous lesion IMPRESSION: 1. No choledocholithiasis. Mild dilatation of the common bile duct following cholecystectomy without obstructing lesion. 2. No intrahepatic biliary duct dilatation. 3. No complication following cholecystectomy. 4. No pancreatitis or pancreatic duct dilatation. Electronically Signed   By: Genevive Bi M.D.   On: 03/08/2022 16:22   CT ABDOMEN PELVIS W CONTRAST  Result Date: 03/08/2022 CLINICAL DATA:  Right upper quadrant pain, status post cholecystectomy EXAM: CT ABDOMEN AND PELVIS WITH CONTRAST TECHNIQUE: Multidetector CT imaging of the abdomen and pelvis was performed using the standard protocol following bolus administration of intravenous contrast. RADIATION DOSE REDUCTION: This exam was performed according to the departmental dose-optimization program which includes automated exposure control, adjustment of the mA and/or kV according to patient size and/or use of iterative reconstruction technique. CONTRAST:  OMNIPAQUE IOHEXOL 300 MG/ML  SOLN COMPARISON:  MRI abdomen dated 03/01/2022 FINDINGS: Lower chest: Lung bases are clear. Hepatobiliary: Liver is within normal limits. Status post cholecystectomy. Trace fluid in the surgical bed (series 3/image 24), postoperative. No findings suspicious for bile leak. Mild central intrahepatic ductal dilatation. Dilated common duct, measuring 12 mm (coronal image 46). While poorly visualized on CT, a 6 mm distal CBD stone is suspected just above the ampulla (series 3/image 26). Pancreas: Within normal limits. Spleen: Within normal limits. Adrenals/Urinary Tract: Adrenal glands are within normal limits. Kidneys are within  normal limits.  No hydronephrosis. Bladder is within normal limits. Stomach/Bowel: Stomach is within normal limits. No evidence of bowel obstruction. Normal  appendix (series 3/image 47). No colonic wall thickening or inflammatory changes. Vascular/Lymphatic: No evidence of abdominal aortic aneurysm. No suspicious abdominopelvic lymphadenopathy. Reproductive: Uterus and bilateral ovaries are within normal limits. Other: No abdominopelvic ascites. Postsurgical changes along the midline anterior abdominal wall. Musculoskeletal: Mild degenerative changes at L5-S1. IMPRESSION: Status post cholecystectomy. Suspected 6 mm distal CBD stone just above the ampulla. Associated mild central intrahepatic and extrahepatic ductal dilatation. ERCP is suggested. Electronically Signed   By: Charline Bills M.D.   On: 03/08/2022 00:32   MR ABDOMEN MRCP W WO CONTAST  Result Date: 03/01/2022 CLINICAL DATA:  Abdominal pain times a few weeks.  Cholelithiasis. EXAM: MRI ABDOMEN WITHOUT AND WITH CONTRAST (INCLUDING MRCP) TECHNIQUE: Multiplanar multisequence MR imaging of the abdomen was performed both before and after the administration of intravenous contrast. Heavily T2-weighted images of the biliary and pancreatic ducts were obtained, and three-dimensional MRCP images were rendered by post processing. CONTRAST:  62mL GADAVIST GADOBUTROL 1 MMOL/ML IV SOLN COMPARISON:  Abdominal ultrasound dated March 01, 2022. FINDINGS: Lower chest: No acute abnormality. Hepatobiliary: No significant hepatic steatosis. No suspicious hepatic lesion. Mild periportal edema. Cholelithiasis without findings of acute cholecystitis. No biliary ductal dilation the common bile duct measures 4-5 mm. No choledocholithiasis. Pancreas: Intrinsic T1 signal of the pancreatic parenchyma is within normal limits. No pancreatic ductal dilation. No pancreatic divisum. No cystic or solid hyperenhancing pancreatic lesion identified. Spleen:  No splenomegaly or focal  splenic lesion. Adrenals/Urinary Tract: No masses identified. No evidence of hydronephrosis. Stomach/Bowel: Visualized portions within the abdomen are unremarkable. Vascular/Lymphatic: No pathologically enlarged lymph nodes identified. No abdominal aortic aneurysm demonstrated. Other:  None. Musculoskeletal: No suspicious bone lesions identified. IMPRESSION: 1. Cholelithiasis without MRI findings of acute cholecystitis. 2. No biliary ductal dilation or choledocholithiasis. 3. Mild periportal edema is nonspecific but most commonly seen in the setting of hepatitis or abundant intravenous rehydration. Electronically Signed   By: Maudry Mayhew M.D.   On: 03/01/2022 11:57   MR 3D Recon At Scanner  Result Date: 03/01/2022 CLINICAL DATA:  Abdominal pain times a few weeks.  Cholelithiasis. EXAM: MRI ABDOMEN WITHOUT AND WITH CONTRAST (INCLUDING MRCP) TECHNIQUE: Multiplanar multisequence MR imaging of the abdomen was performed both before and after the administration of intravenous contrast. Heavily T2-weighted images of the biliary and pancreatic ducts were obtained, and three-dimensional MRCP images were rendered by post processing. CONTRAST:  12mL GADAVIST GADOBUTROL 1 MMOL/ML IV SOLN COMPARISON:  Abdominal ultrasound dated March 01, 2022. FINDINGS: Lower chest: No acute abnormality. Hepatobiliary: No significant hepatic steatosis. No suspicious hepatic lesion. Mild periportal edema. Cholelithiasis without findings of acute cholecystitis. No biliary ductal dilation the common bile duct measures 4-5 mm. No choledocholithiasis. Pancreas: Intrinsic T1 signal of the pancreatic parenchyma is within normal limits. No pancreatic ductal dilation. No pancreatic divisum. No cystic or solid hyperenhancing pancreatic lesion identified. Spleen:  No splenomegaly or focal splenic lesion. Adrenals/Urinary Tract: No masses identified. No evidence of hydronephrosis. Stomach/Bowel: Visualized portions within the abdomen are  unremarkable. Vascular/Lymphatic: No pathologically enlarged lymph nodes identified. No abdominal aortic aneurysm demonstrated. Other:  None. Musculoskeletal: No suspicious bone lesions identified. IMPRESSION: 1. Cholelithiasis without MRI findings of acute cholecystitis. 2. No biliary ductal dilation or choledocholithiasis. 3. Mild periportal edema is nonspecific but most commonly seen in the setting of hepatitis or abundant intravenous rehydration. Electronically Signed   By: Maudry Mayhew M.D.   On: 03/01/2022 11:57   US Abdomen Limited RUQ (LIVER/GB)  Result Date: 03/01/2022 CLINICAL DATA:  Right upper quadrant  pain EXAM: ULTRASOUND ABDOMEN LIMITED RIGHT UPPER QUADRANT COMPARISON:  None Available. FINDINGS: Gallbladder: Multiple gallstones. No gallbladder wall thickening or pericholecystic fluid. A negative sonographic Eulah PontMurphy sign was reported by the sonographer. Common bile duct: Diameter: 7 mm Liver: No focal lesion identified. Within normal limits in parenchymal echogenicity. Portal vein is patent on color Doppler imaging with normal direction of blood flow towards the liver. Other: None. IMPRESSION: 1. Cholelithiasis without evidence of acute cholecystitis. 2. Mildly dilated common bile duct. Electronically Signed   By: Deatra RobinsonKevin  Herman M.D.   On: 03/01/2022 02:48     Subjective: No acute issues or events overnight denies nausea vomiting diarrhea constipation any fevers chills or chest pain   Discharge Exam: Vitals:   03/09/22 0300 03/09/22 0811  BP: (!) 111/53 125/67  Pulse: 81 97  Resp:  17  Temp: 98.9 F (37.2 C) 98.9 F (37.2 C)  SpO2: 98% 95%   Vitals:   03/08/22 2309 03/09/22 0300 03/09/22 0500 03/09/22 0811  BP: 125/70 (!) 111/53  125/67  Pulse: 88 81  97  Resp:    17  Temp:  98.9 F (37.2 C)  98.9 F (37.2 C)  TempSrc:  Oral  Oral  SpO2: 97% 98%  95%  Weight:   114 kg   Height:        General: Pt is alert, awake, not in acute distress Cardiovascular: RRR, S1/S2 +, no  rubs, no gallops Respiratory: CTA bilaterally, no wheezing, no rhonchi Abdominal: Soft, NT, ND, bowel sounds + Extremities: no edema, no cyanosis    The results of significant diagnostics from this hospitalization (including imaging, microbiology, ancillary and laboratory) are listed below for reference.     Microbiology: No results found for this or any previous visit (from the past 240 hour(s)).   Labs: BNP (last 3 results) No results for input(s): "BNP" in the last 8760 hours. Basic Metabolic Panel: Recent Labs  Lab 03/03/22 0304 03/07/22 1443 03/08/22 0338 03/09/22 0322  NA 137 139 138 139  K 3.8 4.1 3.9 4.1  CL 107 106 106 104  CO2 25 23 24 22   GLUCOSE 166* 113* 98 107*  BUN 6 5* 5* <5*  CREATININE 0.88 0.88 0.87 0.81  CALCIUM 8.5* 9.2 8.5* 9.1  MG  --   --  2.0  --    Liver Function Tests: Recent Labs  Lab 03/03/22 0304 03/07/22 1443 03/08/22 0338 03/09/22 0322  AST 72* 463* 205* 86*  ALT 194* 525* 375* 258*  ALKPHOS 116 475* 361* 264*  BILITOT 0.4 1.2 0.8 0.6  PROT 5.7* 7.1 6.1* 5.9*  ALBUMIN 2.9* 3.6 3.1* 3.1*   Recent Labs  Lab 03/07/22 1443  LIPASE 24   No results for input(s): "AMMONIA" in the last 168 hours. CBC: Recent Labs  Lab 03/07/22 1443 03/08/22 0338 03/09/22 0322  WBC 8.4 7.3 6.9  NEUTROABS 4.9 4.4  --   HGB 12.8 11.8* 11.0*  HCT 40.7 37.3 35.3*  MCV 83.7 84.6 85.3  PLT 379 322 332   Cardiac Enzymes: No results for input(s): "CKTOTAL", "CKMB", "CKMBINDEX", "TROPONINI" in the last 168 hours. BNP: Invalid input(s): "POCBNP" CBG: No results for input(s): "GLUCAP" in the last 168 hours. D-Dimer No results for input(s): "DDIMER" in the last 72 hours. Hgb A1c No results for input(s): "HGBA1C" in the last 72 hours. Lipid Profile No results for input(s): "CHOL", "HDL", "LDLCALC", "TRIG", "CHOLHDL", "LDLDIRECT" in the last 72 hours. Thyroid function studies No results for input(s): "TSH", "T4TOTAL", "T3FREE", "  THYROIDAB" in the  last 72 hours.  Invalid input(s): "FREET3" Anemia work up No results for input(s): "VITAMINB12", "FOLATE", "FERRITIN", "TIBC", "IRON", "RETICCTPCT" in the last 72 hours. Urinalysis    Component Value Date/Time   COLORURINE AMBER (A) 03/07/2022 2110   APPEARANCEUR HAZY (A) 03/07/2022 2110   LABSPEC 1.023 03/07/2022 2110   PHURINE 5.0 03/07/2022 2110   GLUCOSEU NEGATIVE 03/07/2022 2110   HGBUR NEGATIVE 03/07/2022 2110   BILIRUBINUR NEGATIVE 03/07/2022 2110   BILIRUBINUR Negative 02/25/2021 0954   KETONESUR NEGATIVE 03/07/2022 2110   PROTEINUR NEGATIVE 03/07/2022 2110   UROBILINOGEN 0.2 02/25/2021 0954   UROBILINOGEN 2.0 (H) 12/08/2012 2144   NITRITE NEGATIVE 03/07/2022 2110   LEUKOCYTESUR NEGATIVE 03/07/2022 2110   Sepsis Labs Recent Labs  Lab 03/07/22 1443 03/08/22 0338 03/09/22 0322  WBC 8.4 7.3 6.9   Microbiology No results found for this or any previous visit (from the past 240 hour(s)).   Time coordinating discharge: Over 30 minutes  SIGNED:   Azucena Fallen, DO [facility] 03/09/2022, 8:54 AM Pager   If 7PM-7AM, please contact night-coverage [email or web address]

## 2022-03-09 NOTE — Telephone Encounter (Signed)
-----   Message from [name], MD sent at 03/09/2022  9:29 AM EDT ----- Regarding: follow up labs [name],  Can you please order a hepatic function panel to be drawn for 1 week from today for Brenda Navarro?  Thanks

## 2022-03-09 NOTE — Telephone Encounter (Signed)
Order in as requested.

## 2022-03-09 NOTE — Telephone Encounter (Signed)
Order in epic as requested.

## 2022-03-09 NOTE — Progress Notes (Signed)
Discharge instructions reviewed with pt.  Copy of instructions  given to pt. No new scripts.  Pt d/c'd via wheelchair with belongings.           Escorted by staff.

## 2022-03-09 NOTE — Progress Notes (Signed)
Mobility Specialist Progress Note:   03/09/22 1000  Mobility  Activity Ambulated independently in hallway  Level of Assistance Independent  Assistive Device None  Distance Ambulated (ft) 550 ft  Activity Response Tolerated well  $Mobility charge 1 Mobility   Pt eager for mobility session. Ambulated independently with no complaints throughout. Pt back in room with all needs met, eager for d/c.  Nelta Numbers Acute Rehab Secure Chat or Office Phone: [phone]

## 2022-03-09 NOTE — Telephone Encounter (Signed)
-----   Message from Scott E Cunningham, MD sent at 03/09/2022  9:29 AM EDT ----- Regarding: follow up labs Nour Rodrigues,  Can you please order a hepatic function panel to be drawn for 1 week from today for Brenda Navarro?  Thanks  

## 2022-03-10 ENCOUNTER — Other Ambulatory Visit: Payer: Self-pay | Admitting: *Deleted

## 2022-03-10 ENCOUNTER — Other Ambulatory Visit: Payer: Self-pay

## 2022-03-10 DIAGNOSIS — K831 Obstruction of bile duct: Secondary | ICD-10-CM

## 2022-03-10 DIAGNOSIS — K8050 Calculus of bile duct without cholangitis or cholecystitis without obstruction: Secondary | ICD-10-CM

## 2022-03-10 NOTE — Patient Outreach (Signed)
  Care Coordination [facility] Note Transition Care Management Unsuccessful Follow-up Telephone Call  Date of discharge and from where:  03/09/2022  Attempts:  1st Attempt  Reason for unsuccessful TCM follow-up call:  Left voice message   Elliot Cousin, RN Care Management Coordinator [facility] [employer] Main Office [phone]

## 2022-03-10 NOTE — Patient Outreach (Signed)
[facility]) Care Management  03/10/2022  Brenda Navarro Oct 24, 1985 [id number]   [facility] [[facility]]  Accountable Care Organization [ACO] Patient: Brenda Navarro Employee Plan  Primary Care Provider:  Pcp, No   Patient on the readmission list, has already transitioned.  Reviewed for readmission prevention and care coordination needs.     Plan: Patient will be referred to a [facility] RN Care Coordinator for post hospital support and identified at seeking a PCP follow up.   For additional questions or referrals please contact:   Charlesetta Shanks, RN BSN CCM [facility]  [phone] business mobile phone Toll free office [phone]  Fax number: [phone] [email or web address]

## 2022-03-11 ENCOUNTER — Other Ambulatory Visit: Payer: Self-pay | Admitting: *Deleted

## 2022-03-11 NOTE — Patient Instructions (Signed)
Visit Information  Thank you for taking time to visit with me today. Please don't hesitate to contact me if I can be of assistance to you.   Following are the goals we discussed today:   Goals Addressed   None     Please call the care guide team at [phone] if you need to cancel or reschedule your appointment.   If you are experiencing a Mental Health or Behavioral Health Crisis or need someone to talk to, please call the Suicide and Crisis Lifeline: 988  Patient verbalizes understanding of instructions and care plan provided today and agrees to view in MyChart. Active MyChart status and patient understanding of how to access instructions and care plan via MyChart confirmed with patient.     No further follow up required: No further needs at this time.    Elliot Cousin, RN Care Management Coordinator [facility] Main Office [phone]

## 2022-03-11 NOTE — Patient Outreach (Signed)
  Care Coordination TOC Note Transition Care Management Follow-up Telephone Call Date of discharge and from where: 03/09/2022 How have you been since you were released from the hospital? Good no issues Any questions or concerns? No  Items Reviewed: Did the pt receive and understand the discharge instructions provided? Yes  Medications obtained and verified? Yes  Other? No  Any new allergies since your discharge? No  Dietary orders reviewed? Yes Do you have support at home? Yes   Home Care and Equipment/Supplies: Were home health services ordered? no If so, what is the name of the agency? na  Has the agency set up a time to come to the patient's home? not applicable Were any new equipment or medical supplies ordered?  No What is the name of the medical supply agency? na Were you able to get the supplies/equipment? no Do you have any questions related to the use of the equipment or supplies? No  Functional Questionnaire: (I = Independent and D = Dependent) ADLs: I  Bathing/Dressing- I  Meal Prep- I  Eating- I  Maintaining continence- I  Transferring/Ambulation- I  Managing Meds- I  Follow up appointments reviewed:  PCP Hospital f/u appt confirmed?  Will only follow up with the surgeon at this time.   Specialist Hospital f/u appt confirmed? Yes  Scheduled to see surgeon on 03/24/2022 @ 2:00 PM. Are transportation arrangements needed? No  If their condition worsens, is the pt aware to call PCP or go to the Emergency Dept.? Yes Was the patient provided with contact information for the PCP's office or ED? Yes (No PCP noted provided [facility] contact to obtain a PCP-[phone]. Was to pt encouraged to call back with questions or concerns? Yes  SDOH assessments and interventions completed:   Yes  Care Coordination Interventions Activated:  Yes   Care Coordination Interventions:   Provided info on obtaining a PCP and FMLA form needed to be completed-encouraged pt to seek  this information from her surgeon     Encounter Outcome:  Pt. Visit Completed    Elliot Cousin, RN Care Management Coordinator [facility] [employer] Main Office [phone]

## 2022-03-16 ENCOUNTER — Other Ambulatory Visit (HOSPITAL_COMMUNITY): Payer: Self-pay

## 2022-03-16 ENCOUNTER — Other Ambulatory Visit (INDEPENDENT_AMBULATORY_CARE_PROVIDER_SITE_OTHER): Payer: No Typology Code available for payment source

## 2022-03-16 DIAGNOSIS — K8050 Calculus of bile duct without cholangitis or cholecystitis without obstruction: Secondary | ICD-10-CM | POA: Diagnosis not present

## 2022-03-16 LAB — HEPATIC FUNCTION PANEL
ALT: 37 U/L — ABNORMAL HIGH (ref 0–35)
AST: 13 U/L (ref 0–37)
Albumin: 4.1 g/dL (ref 3.5–5.2)
Alkaline Phosphatase: 172 U/L — ABNORMAL HIGH (ref 39–117)
Bilirubin, Direct: 0.1 mg/dL (ref 0.0–0.3)
Total Bilirubin: 0.4 mg/dL (ref 0.2–1.2)
Total Protein: 7.1 g/dL (ref 6.0–8.3)

## 2022-03-17 NOTE — Progress Notes (Signed)
Brenda Navarro,  Your labs have improved significantly over the past week.  I expect they will continue to normalize.  No need for follow-up in GI, unless you have recurrence of any symptoms.  Please follow-up as needed with your primary care provider.

## 2022-03-24 ENCOUNTER — Other Ambulatory Visit (HOSPITAL_COMMUNITY): Payer: Self-pay

## 2022-03-24 MED ORDER — CHOLESTYRAMINE 4 G PO PACK
4.0000 g | PACK | Freq: Two times a day (BID) | ORAL | 0 refills | Status: DC
Start: 2022-03-24 — End: 2022-09-09
  Filled 2022-03-24: qty 60, 30d supply, fill #0

## 2022-03-25 ENCOUNTER — Other Ambulatory Visit (HOSPITAL_COMMUNITY): Payer: Self-pay

## 2022-04-05 ENCOUNTER — Other Ambulatory Visit (HOSPITAL_COMMUNITY): Payer: Self-pay

## 2022-09-09 ENCOUNTER — Ambulatory Visit (INDEPENDENT_AMBULATORY_CARE_PROVIDER_SITE_OTHER): Payer: 59 | Admitting: Nurse Practitioner

## 2022-09-09 ENCOUNTER — Other Ambulatory Visit (HOSPITAL_COMMUNITY): Payer: Self-pay

## 2022-09-09 ENCOUNTER — Encounter: Payer: Self-pay | Admitting: Nurse Practitioner

## 2022-09-09 VITALS — BP 126/84 | HR 98 | Ht 64.5 in | Wt 247.4 lb

## 2022-09-09 DIAGNOSIS — E559 Vitamin D deficiency, unspecified: Secondary | ICD-10-CM

## 2022-09-09 DIAGNOSIS — Z0000 Encounter for general adult medical examination without abnormal findings: Secondary | ICD-10-CM

## 2022-09-09 DIAGNOSIS — R7303 Prediabetes: Secondary | ICD-10-CM | POA: Diagnosis not present

## 2022-09-09 DIAGNOSIS — F339 Major depressive disorder, recurrent, unspecified: Secondary | ICD-10-CM

## 2022-09-09 DIAGNOSIS — Z789 Other specified health status: Secondary | ICD-10-CM | POA: Insufficient documentation

## 2022-09-09 DIAGNOSIS — Z6841 Body Mass Index (BMI) 40.0 and over, adult: Secondary | ICD-10-CM

## 2022-09-09 DIAGNOSIS — F411 Generalized anxiety disorder: Secondary | ICD-10-CM

## 2022-09-09 DIAGNOSIS — E538 Deficiency of other specified B group vitamins: Secondary | ICD-10-CM | POA: Diagnosis not present

## 2022-09-09 HISTORY — DX: Other specified health status: Z78.9

## 2022-09-09 HISTORY — DX: Encounter for general adult medical examination without abnormal findings: Z00.00

## 2022-09-09 LAB — LIPID PANEL

## 2022-09-09 MED ORDER — ESCITALOPRAM OXALATE 10 MG PO TABS
ORAL_TABLET | ORAL | 3 refills | Status: DC
  Filled 2022-09-09: qty 30, 30d supply, fill #0
  Filled 2022-10-04: qty 30, fill #1
  Filled 2022-10-13: qty 30, 30d supply, fill #1
  Filled 2022-11-15: qty 30, 30d supply, fill #2

## 2022-09-09 MED ORDER — LORAZEPAM 0.5 MG PO TABS
0.2500 mg | ORAL_TABLET | Freq: Two times a day (BID) | ORAL | 0 refills | Status: DC | PRN
  Filled 2022-09-09: qty 30, 15d supply, fill #0

## 2022-09-09 NOTE — Progress Notes (Signed)
Brenda Keeler, DNP, AGNP-c [facility], [place] [address] Main Office [phone]  BP 126/84   Pulse 98   Ht 5' 4.5" (1.638 m)   Wt 247 lb 6.4 oz (112.2 kg)   BMI 41.81 kg/m    Subjective:    Patient ID: Brenda Navarro, female    DOB: 07/05/1986, 37 y.o.   MRN: [id number]  HPI: Brenda Navarro is a 37 y.o. female presenting on 09/09/2022 for comprehensive medical examination.   Truddie Crumble presents today to establish care and has requested a referral to Obstetrics for Nexplanon insertion. Additionally, the patient is seeking support for anxiety management, expressing a preference for an as-needed medication over a daily regimen.  The patient has been grappling with mental health issues for the past six months, which have impacted her work performance, mood, and ability to perform daily activities. She has been attending therapy sessions and reports experiencing panic attacks approximately one to two times per week. Sleep disturbances are also a concern, with the patient having trouble falling asleep, staying asleep, and returning to sleep after waking during the night.  In her medical history, the patient notes that she has previously been prescribed Paxil during her teenage years and Wellbutrin within the last two years.  With respect to her menstrual health, the patient indicates that her periods are typically regular. However, she reports that her last menstrual cycle was unusually prolonged, lasting nine days, which she attributes to taking Plan B in November. She is currently awaiting the normalization of her menstrual cycle.    IMMUNIZATIONS:   Flu: Flu vaccine completed elsewhere this season Prevnar 13: Prevnar 13 N/A for this patient Prevnar 20: Prevnar 20 N/A for this patient Pneumovax 23: Pneumovax 23 N/A for this patient Vac Shingrix: Shingrix N/A for this patient HPV: HPV N/A for this patient Tetanus: Tetanus completed in the last 10  years  HEALTH MAINTENANCE: Pap Smear HM Status: is up to date Mammogram HM Status: is not applicable for this patient Colon Cancer Screening HM Status: is not applicable for this patient Bone Density HM Status: is not applicable for this patient STI Testing HM Status: was completed today  She reports regular vision exams q1-5y: Yes  She reports regular dental exams q 38m  Yes  The patient eats a regular, healthy diet. She endorses exercise and/or activity of:  active at work   Pertinent items are noted in HPI.  Most Recent Depression Screen:     09/09/2022   10:52 AM 06/30/2021    1:41 PM 08/21/2020   12:26 PM 02/20/2020   10:50 AM 02/07/2020   11:16 AM  Depression screen PHQ 2/9  Decreased Interest 1 0 1 3 0  Down, Depressed, Hopeless 1 0 1 2 0  PHQ - 2 Score 2 0 2 5 0  Altered sleeping '3 3 3 2   '$ Tired, decreased energy '3 3 2 3   '$ Change in appetite 3 0 2 2   Feeling bad or failure about yourself  1 0 0 3   Trouble concentrating 3 0 1 1   Moving slowly or fidgety/restless 0 0 0 3   Suicidal thoughts 0 0 0 1   PHQ-9 Score '15 6 10 20   '$ Difficult doing work/chores Somewhat difficult Somewhat difficult Somewhat difficult Very difficult    Most Recent Anxiety Screen:     09/09/2022   10:54 AM 06/30/2021    1:41 PM 08/21/2020   12:27 PM  GAD 7 :  Generalized Anxiety Score  Nervous, Anxious, on Edge '1 2 2  '$ Control/stop worrying '2 1 2  '$ Worry too much - different things '3 1 2  '$ Trouble relaxing '3 1 1  '$ Restless 1 0 0  Easily annoyed or irritable 3 0 2  Afraid - awful might happen 1 0 3  Total GAD 7 Score '14 5 12  '$ Anxiety Difficulty Very difficult Somewhat difficult Somewhat difficult   Most Recent Fall Screen:    09/09/2022   10:52 AM 02/07/2020   11:16 AM 09/25/2019    9:25 AM  Fall Risk   Falls in the past year? 0 0   Number falls in past yr: 0  0  Injury with Fall? 0  0  Risk for fall due to : No Fall Risks    Follow up Falls evaluation completed      Past medical  history, surgical history, medications, allergies, family history and social history reviewed with patient today and changes made to appropriate areas of the chart.  Past Medical History:  Past Medical History:  Diagnosis Date   Abnormal Pap smear    Back pain    Depression    Obesity    Uterine perforation by intrauterine contraceptive device 2017   Medications:  No current outpatient medications on file prior to visit.   No current facility-administered medications on file prior to visit.   Surgical History:  Past Surgical History:  Procedure Laterality Date   ABDOMINAL SURGERY  2017   iud perforated uterus, had to remove   CHOLECYSTECTOMY N/A 03/02/2022   Procedure: LAPAROSCOPIC CHOLECYSTECTOMY;  Surgeon: Greer Pickerel, MD;  Location: [facility];  Service: General;  Laterality: N/A;   COLPOSCOPY  2011   Allergies:  Allergies  Allergen Reactions   Nickel Rash    Blistering rash with prolonged contact   Family History:  Family History  Adopted: Yes  Family history unknown: Yes       Objective:    BP 126/84   Pulse 98   Ht 5' 4.5" (1.638 m)   Wt 247 lb 6.4 oz (112.2 kg)   BMI 41.81 kg/m   Wt Readings from Last 3 Encounters:  09/09/22 247 lb 6.4 oz (112.2 kg)  03/09/22 251 lb 5.2 oz (114 kg)  03/03/22 223 lb 5.2 oz (101.3 kg)    Physical Exam Vitals and nursing note reviewed.  Constitutional:      General: She is not in acute distress.    Appearance: Normal appearance. She is obese.  HENT:     Head: Normocephalic and atraumatic.     Right Ear: Hearing, tympanic membrane, ear canal and external ear normal.     Left Ear: Hearing, tympanic membrane, ear canal and external ear normal.     Nose: Nose normal.     Right Sinus: No maxillary sinus tenderness or frontal sinus tenderness.     Left Sinus: No maxillary sinus tenderness or frontal sinus tenderness.     Mouth/Throat:     Lips: Pink.     Mouth: Mucous membranes are moist.     Pharynx: Oropharynx is clear.   Eyes:     General: Lids are normal. Vision grossly intact.     Extraocular Movements: Extraocular movements intact.     Conjunctiva/sclera: Conjunctivae normal.     Pupils: Pupils are equal, round, and reactive to light.     Funduscopic exam:    Right eye: Red reflex present.        Left eye:  Red reflex present.    Visual Fields: Right eye visual fields normal and left eye visual fields normal.  Neck:     Thyroid: No thyromegaly.     Vascular: No carotid bruit.  Cardiovascular:     Rate and Rhythm: Normal rate and regular rhythm.     Chest Wall: PMI is not displaced.     Pulses: Normal pulses.          Dorsalis pedis pulses are 2+ on the right side and 2+ on the left side.       Posterior tibial pulses are 2+ on the right side and 2+ on the left side.     Heart sounds: Normal heart sounds. No murmur heard. Pulmonary:     Effort: Pulmonary effort is normal. No respiratory distress.     Breath sounds: Normal breath sounds.  Abdominal:     General: Abdomen is flat. Bowel sounds are normal. There is no distension.     Palpations: Abdomen is soft. There is no hepatomegaly, splenomegaly or mass.     Tenderness: There is no abdominal tenderness. There is no right CVA tenderness, left CVA tenderness, guarding or rebound.  Musculoskeletal:        General: Normal range of motion.     Cervical back: Full passive range of motion without pain, normal range of motion and neck supple. No tenderness.     Right lower leg: No edema.     Left lower leg: No edema.  Feet:     Left foot:     Toenail Condition: Left toenails are normal.  Lymphadenopathy:     Cervical: No cervical adenopathy.     Upper Body:     Right upper body: No supraclavicular adenopathy.     Left upper body: No supraclavicular adenopathy.  Skin:    General: Skin is warm and dry.     Capillary Refill: Capillary refill takes less than 2 seconds.     Nails: There is no clubbing.  Neurological:     General: No focal deficit  present.     Mental Status: She is alert and oriented to person, place, and time.     GCS: GCS eye subscore is 4. GCS verbal subscore is 5. GCS motor subscore is 6.     Sensory: Sensation is intact.     Motor: Motor function is intact.     Coordination: Coordination is intact.     Gait: Gait is intact.     Deep Tendon Reflexes: Reflexes are normal and symmetric.  Psychiatric:        Attention and Perception: Attention normal.        Mood and Affect: Mood normal.        Speech: Speech normal.        Behavior: Behavior is cooperative.        Thought Content: Thought content normal.        Cognition and Memory: Cognition and memory normal.     Results for orders placed or performed in visit on 03/16/22  Hepatic function panel  Result Value Ref Range   Total Bilirubin 0.4 0.2 - 1.2 mg/dL   Bilirubin, Direct 0.1 0.0 - 0.3 mg/dL   Alkaline Phosphatase 172 (H) 39 - 117 U/L   AST 13 0 - 37 U/L   ALT 37 (H) 0 - 35 U/L   Total Protein 7.1 6.0 - 8.3 g/dL   Albumin 4.1 3.5 - 5.2 g/dL  Assessment & Plan:   Problem List Items Addressed This Visit     Prediabetes    Labs pending      Relevant Orders   CBC with Differential/Platelet   Comprehensive metabolic panel   Hemoglobin A1c   Iron, TIBC and Ferritin Panel   Lipid panel   VITAMIN D 25 Hydroxy (Vit-D Deficiency, Fractures)   TSH   Class 3 severe obesity with serious comorbidity and body mass index (BMI) of 40.0 to 44.9 in adult [facility])    Routine labs pending      Relevant Orders   CBC with Differential/Platelet   Comprehensive metabolic panel   Hemoglobin A1c   Iron, TIBC and Ferritin Panel   Lipid panel   VITAMIN D 25 Hydroxy (Vit-D Deficiency, Fractures)   TSH   Generalized anxiety disorder    She has been experiencing anxiety and panic attacks over the last six months, with an increase in panic attack frequency to 1-2 times weekly. We discussed the importance of both daily medication in addition to as  needed treatment. She is in agreement to trial lexapro and requests ativan.  Plan: - Begin treatment with Lexapro for daily anxiety management and Ativan for as-needed use. - Schedule a follow-up appointment in three months to assess medication efficacy and adjust treatment if necessary.      Relevant Medications   LORazepam (ATIVAN) 0.5 MG tablet   escitalopram (LEXAPRO) 10 MG tablet   Other Relevant Orders   CBC with Differential/Platelet   Comprehensive metabolic panel   Hemoglobin A1c   Iron, TIBC and Ferritin Panel   Lipid panel   VITAMIN D 25 Hydroxy (Vit-D Deficiency, Fractures)   TSH   Uses contraceptive implant for birth control    The patient is interested in the Nexplanon implant for contraception. Typically this is not performed in this office, but I am comfortable with the insertion. I discussed with her that I will check to see if we can order the device and if so, I will be happy to place it.  Plan: - Check on ability to order nexplanon, if unable to order, send referral to an OB for the Nexplanon implant procedure.       Relevant Orders   Ambulatory referral to Obstetrics / Gynecology   Ct, Ng, Mycoplasmas NAA, Urine   HSV(herpes simplex vrs) 1+2 ab-IgG   Hepatitis B surface antigen   RPR   HIV Antibody (routine testing w rflx)   Hepatitis C antibody   Encounter for annual physical exam - Primary    CPE today with no abnormalities noted on exam.  Labs pending. Will make changes as necessary based on results.  Review of HM activities and recommendations discussed and provided on AVS Anticipatory guidance, diet, and exercise recommendations provided.  Medications, allergies, and hx reviewed and updated as necessary.  Plan to f/u with CPE in 1 year or sooner for acute/chronic health needs as directed.        Vitamin B12 deficiency   Relevant Orders   CBC with Differential/Platelet   Comprehensive metabolic panel   Hemoglobin A1c   Iron, TIBC and Ferritin  Panel   Lipid panel   VITAMIN D 25 Hydroxy (Vit-D Deficiency, Fractures)   TSH   Vitamin D deficiency   Relevant Orders   CBC with Differential/Platelet   Comprehensive metabolic panel   Hemoglobin A1c   Iron, TIBC and Ferritin Panel   Lipid panel   VITAMIN D 25 Hydroxy (Vit-D Deficiency, Fractures)  TSH   Other Visit Diagnoses     Depression, recurrent ([place])       Relevant Medications   LORazepam (ATIVAN) 0.5 MG tablet   escitalopram (LEXAPRO) 10 MG tablet   Other Relevant Orders   CBC with Differential/Platelet   Comprehensive metabolic panel   Hemoglobin A1c   Iron, TIBC and Ferritin Panel   Lipid panel   VITAMIN D 25 Hydroxy (Vit-D Deficiency, Fractures)   TSH          Follow up plan: Return in about 3 months (around 12/10/2022) for virtual- Mood.  NEXT PREVENTATIVE PHYSICAL DUE IN 1 YEAR.  PATIENT COUNSELING PROVIDED FOR ALL ADULT PATIENTS:  Consume a well balanced diet low in saturated fats, cholesterol, and moderation in carbohydrates.   This can be as simple as monitoring portion sizes and cutting back on sugary beverages such as soda and juice to start with.    Daily water consumption of at least 64 ounces.  Physical activity at least 180 minutes per week, if just starting out.   This can be as simple as taking the stairs instead of the elevator and walking 2-3 laps around the office  purposefully every day.   STD protection, partner selection, and regular testing if high risk.  Limited consumption of alcoholic beverages if alcohol is consumed.  For women, I recommend no more than 7 alcoholic beverages per week, spread out throughout the week.  Avoid "binge" drinking or consuming large quantities of alcohol in one setting.   Please let me know if you feel you may need help with reduction or quitting alcohol consumption.   Avoidance of nicotine, if used.  Please let me know if you feel you may need help with reduction or quitting nicotine use.   Daily  mental health attention.  This can be in the form of 5 minute daily meditation, prayer, journaling, yoga, reflection, etc.   Purposeful attention to your emotions and mental state can significantly improve your overall wellbeing and Health.  Please know that I am here to help you with all of your health care goals and am happy to work with you to find a solution that works best for you.  The greatest advice I have received with any changes in life are to take it one step at a time, that even means if all you can focus on is the next 60 seconds, then do that and celebrate your victories.  With any changes in life, you will have set backs, and that is OK. The important thing to remember is, if you have a set back, it is not a failure, it is an opportunity to try again!  Health Maintenance Recommendations Screening Testing Mammogram Every 1 -2 years based on history and risk factors Starting at age 31 Pap Smear Ages 21-39 every 3 years Ages 90-65 every 5 years with HPV testing More frequent testing may be required based on results and history Colon Cancer Screening Every 1-10 years based on test performed, risk factors, and history Starting at age 70 Bone Density Screening Every 2-10 years based on history Starting at age 44 for women Recommendations for men differ based on medication usage, history, and risk factors AAA Screening One time ultrasound Men 80-36 years old who have every smoked Lung Cancer Screening Low Dose Lung CT every 12 months Age 103-80 years with a 30 pack-year smoking history who still smoke or who have quit within the last 15 years  Screening Labs Routine  Labs:  Complete Blood Count (CBC), Complete Metabolic Panel (CMP), Cholesterol (Lipid Panel) Every 6-12 months based on history and medications May be recommended more frequently based on current conditions or previous results Hemoglobin A1c Lab Every 3-12 months based on history and previous results Starting  at age 36 or earlier with diagnosis of diabetes, high cholesterol, BMI >26, and/or risk factors Frequent monitoring for patients with diabetes to ensure blood sugar control Thyroid Panel (TSH w/ T3 & T4) Every 6 months based on history, symptoms, and risk factors May be repeated more often if on medication HIV One time testing for all patients 76 and older May be repeated more frequently for patients with increased risk factors or exposure Hepatitis C One time testing for all patients 70 and older May be repeated more frequently for patients with increased risk factors or exposure Gonorrhea, Chlamydia Every 12 months for all sexually active persons 13-24 years Additional monitoring may be recommended for those who are considered high risk or who have symptoms PSA Men 62-16 years old with risk factors Additional screening may be recommended from age 23-69 based on risk factors, symptoms, and history  Vaccine Recommendations Tetanus Booster All adults every 10 years Flu Vaccine All patients 6 months and older every year COVID Vaccine All patients 12 years and older Initial dosing with booster May recommend additional booster based on age and health history HPV Vaccine 2 doses all patients age 57-26 Dosing may be considered for patients over 26 Shingles Vaccine (Shingrix) 2 doses all adults 46 years and older Pneumonia (Pneumovax 23) All adults 18 years and older May recommend earlier dosing based on health history Pneumonia (Prevnar 74) All adults 53 years and older Dosed 1 year after Pneumovax 23  Additional Screening, Testing, and Vaccinations may be recommended on an individualized basis based on family history, health history, risk factors, and/or exposure.

## 2022-09-09 NOTE — Assessment & Plan Note (Signed)
She has been experiencing anxiety and panic attacks over the last six months, with an increase in panic attack frequency to 1-2 times weekly. We discussed the importance of both daily medication in addition to as needed treatment. She is in agreement to trial lexapro and requests ativan.  Plan: - Begin treatment with Lexapro for daily anxiety management and Ativan for as-needed use. - Schedule a follow-up appointment in three months to assess medication efficacy and adjust treatment if necessary.

## 2022-09-09 NOTE — Assessment & Plan Note (Signed)
The patient is interested in the Nexplanon implant for contraception. Typically this is not performed in this office, but I am comfortable with the insertion. I discussed with her that I will check to see if we can order the device and if so, I will be happy to place it.  Plan: - Check on ability to order nexplanon, if unable to order, send referral to an OB for the Nexplanon implant procedure.

## 2022-09-09 NOTE — Assessment & Plan Note (Signed)

## 2022-09-09 NOTE — Assessment & Plan Note (Signed)
Labs pending.  

## 2022-09-09 NOTE — Assessment & Plan Note (Signed)
Routine labs pending

## 2022-09-10 LAB — CBC WITH DIFFERENTIAL/PLATELET
Basophils Absolute: 0 10*3/uL (ref 0.0–0.2)
Basos: 0 %
EOS (ABSOLUTE): 0.1 10*3/uL (ref 0.0–0.4)
Eos: 1 %
Hematocrit: 40.2 % (ref 34.0–46.6)
Hemoglobin: 12.9 g/dL (ref 11.1–15.9)
Immature Grans (Abs): 0 10*3/uL (ref 0.0–0.1)
Immature Granulocytes: 0 %
Lymphocytes Absolute: 2.2 10*3/uL (ref 0.7–3.1)
Lymphs: 23 %
MCH: 26.3 pg — ABNORMAL LOW (ref 26.6–33.0)
MCHC: 32.1 g/dL (ref 31.5–35.7)
MCV: 82 fL (ref 79–97)
Monocytes Absolute: 0.6 10*3/uL (ref 0.1–0.9)
Monocytes: 6 %
Neutrophils Absolute: 6.7 10*3/uL (ref 1.4–7.0)
Neutrophils: 70 %
Platelets: 386 10*3/uL (ref 150–450)
RBC: 4.91 x10E6/uL (ref 3.77–5.28)
RDW: 13.2 % (ref 11.7–15.4)
WBC: 9.6 10*3/uL (ref 3.4–10.8)

## 2022-09-10 LAB — COMPREHENSIVE METABOLIC PANEL
ALT: 14 IU/L (ref 0–32)
AST: 12 IU/L (ref 0–40)
Albumin/Globulin Ratio: 1.8 (ref 1.2–2.2)
Albumin: 4.2 g/dL (ref 3.9–4.9)
Alkaline Phosphatase: 121 IU/L (ref 44–121)
BUN/Creatinine Ratio: 11 (ref 9–23)
BUN: 10 mg/dL (ref 6–20)
Bilirubin Total: <0.2 mg/dL (ref 0.0–1.2)
CO2: 23 mmol/L (ref 20–29)
Calcium: 9.4 mg/dL (ref 8.7–10.2)
Chloride: 103 mmol/L (ref 96–106)
Creatinine, Ser: 0.92 mg/dL (ref 0.57–1.00)
Globulin, Total: 2.3 g/dL (ref 1.5–4.5)
Glucose: 122 mg/dL — ABNORMAL HIGH (ref 70–99)
Potassium: 4.5 mmol/L (ref 3.5–5.2)
Sodium: 141 mmol/L (ref 134–144)
Total Protein: 6.5 g/dL (ref 6.0–8.5)
eGFR: 83 mL/min/{1.73_m2} (ref >59)

## 2022-09-10 LAB — IRON,TIBC AND FERRITIN PANEL
Ferritin: 28 ng/mL (ref 15–150)
Iron Saturation: 20 % (ref 15–55)
Iron: 53 ug/dL (ref 27–159)
Total Iron Binding Capacity: 268 ug/dL (ref 250–450)
UIBC: 215 ug/dL (ref 131–425)

## 2022-09-10 LAB — RPR: RPR Ser Ql: NONREACTIVE

## 2022-09-10 LAB — LIPID PANEL
Chol/HDL Ratio: 3.2 ratio (ref 0.0–4.4)
Cholesterol, Total: 152 mg/dL (ref 100–199)
HDL: 47 mg/dL (ref >39)
LDL Chol Calc (NIH): 80 mg/dL (ref 0–99)
Triglycerides: 144 mg/dL (ref 0–149)
VLDL Cholesterol Cal: 25 mg/dL (ref 5–40)

## 2022-09-10 LAB — VITAMIN D 25 HYDROXY (VIT D DEFICIENCY, FRACTURES): Vit D, 25-Hydroxy: 15 ng/mL — ABNORMAL LOW (ref 30.0–100.0)

## 2022-09-10 LAB — HIV ANTIBODY (ROUTINE TESTING W REFLEX): HIV Screen 4th Generation wRfx: NONREACTIVE

## 2022-09-10 LAB — HSV 1 AND 2 AB, IGG
HSV 1 Glycoprotein G Ab, IgG: 31.3 {index} — ABNORMAL HIGH (ref 0.00–0.90)
HSV 2 IgG, Type Spec: <0.91 {index} (ref 0.00–0.90)

## 2022-09-10 LAB — TSH: TSH: 0.876 u[IU]/mL (ref 0.450–4.500)

## 2022-09-10 LAB — HEPATITIS B SURFACE ANTIGEN: Hepatitis B Surface Ag: NEGATIVE

## 2022-09-10 LAB — HEMOGLOBIN A1C
Est. average glucose Bld gHb Est-mCnc: 120 mg/dL
Hgb A1c MFr Bld: 5.8 % — ABNORMAL HIGH (ref 4.8–5.6)

## 2022-09-10 LAB — HEPATITIS C ANTIBODY: Hep C Virus Ab: NONREACTIVE

## 2022-09-14 ENCOUNTER — Encounter: Payer: Self-pay | Admitting: Nurse Practitioner

## 2022-09-15 LAB — CT, NG, MYCOPLASMAS NAA, URINE
Chlamydia trachomatis, NAA: NEGATIVE
Mycoplasma genitalium NAA: NEGATIVE
Mycoplasma hominis NAA: NEGATIVE
Neisseria gonorrhoeae, NAA: NEGATIVE
Ureaplasma spp NAA: POSITIVE — AB

## 2022-09-16 ENCOUNTER — Other Ambulatory Visit: Payer: Self-pay | Admitting: Nurse Practitioner

## 2022-09-16 DIAGNOSIS — N341 Nonspecific urethritis: Secondary | ICD-10-CM

## 2022-09-16 DIAGNOSIS — A493 Mycoplasma infection, unspecified site: Secondary | ICD-10-CM

## 2022-09-16 MED ORDER — AZITHROMYCIN 500 MG PO TABS
1000.0000 mg | ORAL_TABLET | Freq: Once | ORAL | 0 refills | Status: AC
  Filled 2022-09-16: qty 2, 1d supply, fill #0

## 2022-09-17 ENCOUNTER — Other Ambulatory Visit (HOSPITAL_COMMUNITY): Payer: Self-pay

## 2022-09-19 ENCOUNTER — Other Ambulatory Visit (HOSPITAL_COMMUNITY): Payer: Self-pay

## 2022-09-19 ENCOUNTER — Other Ambulatory Visit: Payer: Self-pay

## 2022-09-19 MED ORDER — METFORMIN HCL 500 MG PO TABS
500.0000 mg | ORAL_TABLET | Freq: Every day | ORAL | 1 refills | Status: DC
  Filled 2022-09-19: qty 90, 90d supply, fill #0

## 2022-09-29 ENCOUNTER — Encounter: Payer: Self-pay | Admitting: Nurse Practitioner

## 2022-09-29 ENCOUNTER — Ambulatory Visit (INDEPENDENT_AMBULATORY_CARE_PROVIDER_SITE_OTHER): Payer: 59 | Admitting: Nurse Practitioner

## 2022-09-29 VITALS — BP 114/70 | HR 68 | Wt 243.2 lb

## 2022-09-29 DIAGNOSIS — Z30017 Encounter for initial prescription of implantable subdermal contraceptive: Secondary | ICD-10-CM | POA: Diagnosis not present

## 2022-09-29 NOTE — Progress Notes (Signed)
  Orma Render, DNP, AGNP-c [facility] [place], [place] [address] [phone]  Subjective:   Brenda Navarro is a 37 y.o. female presents to day for evaluation of: Insertion of nexplanon birth control implant.  Procedure discussed with patient including expectations, common side effects, risks, and benefits. She verbally consented to the procedure.   PMH, Medications, and Allergies reviewed and updated in chart as appropriate.   ROS negative except for what is listed in HPI. Objective:  BP 114/70   Pulse 68   Wt 243 lb 3.2 oz (110.3 kg)   BMI 41.10 kg/m  Physical Exam Vitals and nursing note reviewed.  Constitutional:      Appearance: Normal appearance. She is obese.  HENT:     Head: Normocephalic.  Eyes:     Pupils: Pupils are equal, round, and reactive to light.  Cardiovascular:     Rate and Rhythm: Normal rate.     Pulses: Normal pulses.     Heart sounds: Normal heart sounds.  Pulmonary:     Effort: Pulmonary effort is normal.  Musculoskeletal:        General: Normal range of motion.     Cervical back: Normal range of motion.  Skin:    General: Skin is warm and dry.     Capillary Refill: Capillary refill takes less than 2 seconds.  Neurological:     General: No focal deficit present.     Mental Status: She is alert and oriented to person, place, and time.     Motor: No weakness.  Psychiatric:        Mood and Affect: Mood normal.        Assessment & Plan:   Problem List Items Addressed This Visit     Nexplanon insertion - Primary    Procedure: Consent obtained.   Patient positioned in supine position on exam table with her left arm flexed at the elbow and externally rotated. 5 rights reviewed. Area cleansed with Betadine x 3 and draped in normal sterile fashion.  Insertion site and surrounding tissue anesthetized with 1% Lidocaine without epinephrine buffered with HCO3 for comfort. Total of 3cc used.  After ensuring anesthesia  of site and measuring to confirm placement, insertion needle was inserted into the skin, just below the surface. Needle advanced ensuring elevation of skin to keep device from having a deep placement. Insertion handle depressed and applicator removed.  Nexplanon easily palpated in the arm by both myself and the patient.  Small amount of bleeding at insertion site made hemostatic with pressure.  Sterile dressing placed and patient provided with information on how long to keep dressing in place and monitoring for side effects. Patient tolerated procedure well without any adverse events.           Orma Render, DNP, AGNP-c 10/04/2022  8:31 PM    History, Medications, Surgery, SDOH, and Family History reviewed and updated as appropriate.

## 2022-09-29 NOTE — Patient Instructions (Signed)
You did FANTASTIC!! I am so proud of you!!    NEXPLANON POSTINSERTION CARE AND FOLLOW-UP  Your Nexplanon will provide contraceptive management to protect against pregnancy for the next 3 years. Some women choose to keep this in place for up to 5 years, which has shown to be effective.   Most patients do not experience pain after insertion, but if it occurs Tylenol or Ibuprofen may be used as directed on the packaging to help.  Back-up contraception -- Abstinence or back-up contraception is suggested for the first 7 days after insertion if the implant is inserted.   Symptoms requiring evaluation -- Please notify us if you have any change in their health status such as new medical diagnosis or initiation of new medications.   If you experience new onset of the following symptoms, let us know or seek evaluation immediately: ?Persistent lower leg pain ?Severe chest pain or heaviness ?Sudden shortness of breath, sharp chest pain, or coughing blood ?Symptoms of a severe allergic reaction, such as swollen face, tongue or pharynx; trouble swallowing; or hives and trouble breathing ?Sudden severe headache that is not consistent with usual headaches ?Weakness or numbness in an arm or leg, or difficulty speaking ?Sudden partial or complete blindness ?Yellowing skin or whites of eyes, especially with fever; tiredness; loss of appetite; dark-colored urine; or light-colored bowel movements ?Severe pain, swelling, or tenderness in the lower abdomen ?New breast lump or mass ?Problems sleeping, lack of energy, tiredness, or change in mood ?Heavy menstrual bleeding ?Concern that the implant may have broken or bent

## 2022-10-04 DIAGNOSIS — Z30017 Encounter for initial prescription of implantable subdermal contraceptive: Secondary | ICD-10-CM | POA: Insufficient documentation

## 2022-10-04 NOTE — Assessment & Plan Note (Signed)
Procedure: Consent obtained.   Patient positioned in supine position on exam table with her left arm flexed at the elbow and externally rotated. 5 rights reviewed. Area cleansed with Betadine x 3 and draped in normal sterile fashion.  Insertion site and surrounding tissue anesthetized with 1% Lidocaine without epinephrine buffered with HCO3 for comfort. Total of 3cc used.  After ensuring anesthesia of site and measuring to confirm placement, insertion needle was inserted into the skin, just below the surface. Needle advanced ensuring elevation of skin to keep device from having a deep placement. Insertion handle depressed and applicator removed.  Nexplanon easily palpated in the arm by both myself and the patient.  Small amount of bleeding at insertion site made hemostatic with pressure.  Sterile dressing placed and patient provided with information on how long to keep dressing in place and monitoring for side effects. Patient tolerated procedure well without any adverse events.

## 2022-10-05 ENCOUNTER — Other Ambulatory Visit (HOSPITAL_COMMUNITY): Payer: Self-pay

## 2022-10-13 ENCOUNTER — Other Ambulatory Visit (HOSPITAL_COMMUNITY): Payer: Self-pay

## 2022-11-15 ENCOUNTER — Other Ambulatory Visit: Payer: Self-pay | Admitting: Nurse Practitioner

## 2022-11-15 ENCOUNTER — Other Ambulatory Visit: Payer: Self-pay

## 2022-11-15 ENCOUNTER — Other Ambulatory Visit (HOSPITAL_COMMUNITY): Payer: Self-pay

## 2022-11-15 DIAGNOSIS — F411 Generalized anxiety disorder: Secondary | ICD-10-CM

## 2022-11-15 DIAGNOSIS — F339 Major depressive disorder, recurrent, unspecified: Secondary | ICD-10-CM

## 2022-11-15 NOTE — Telephone Encounter (Signed)
Refill request last apt 09/09/22 next apt 12/12/22.

## 2022-11-21 ENCOUNTER — Other Ambulatory Visit (HOSPITAL_COMMUNITY): Payer: Self-pay

## 2022-11-21 MED ORDER — LORAZEPAM 0.5 MG PO TABS
0.2500 mg | ORAL_TABLET | Freq: Two times a day (BID) | ORAL | 0 refills | Status: DC | PRN
  Filled 2022-11-21: qty 30, 15d supply, fill #0

## 2022-11-28 ENCOUNTER — Other Ambulatory Visit (HOSPITAL_COMMUNITY): Payer: Self-pay

## 2022-12-12 ENCOUNTER — Ambulatory Visit: Payer: 59 | Admitting: Nurse Practitioner

## 2022-12-16 ENCOUNTER — Encounter: Payer: Self-pay | Admitting: Nurse Practitioner

## 2023-01-19 ENCOUNTER — Other Ambulatory Visit (HOSPITAL_COMMUNITY): Payer: Self-pay

## 2023-01-19 MED ORDER — AMOXICILLIN 250 MG PO CAPS
250.0000 mg | ORAL_CAPSULE | Freq: Three times a day (TID) | ORAL | 0 refills | Status: DC
  Filled 2023-01-19: qty 21, 7d supply, fill #0

## 2023-01-19 MED ORDER — IBUPROFEN 800 MG PO TABS
800.0000 mg | ORAL_TABLET | Freq: Four times a day (QID) | ORAL | 0 refills | Status: DC | PRN
  Filled 2023-01-19: qty 16, 4d supply, fill #0

## 2023-01-23 ENCOUNTER — Other Ambulatory Visit (HOSPITAL_COMMUNITY): Payer: Self-pay

## 2023-01-30 ENCOUNTER — Other Ambulatory Visit (HOSPITAL_COMMUNITY): Payer: Self-pay

## 2023-03-14 ENCOUNTER — Encounter: Payer: Self-pay | Admitting: Nurse Practitioner

## 2023-03-14 ENCOUNTER — Other Ambulatory Visit (HOSPITAL_COMMUNITY): Payer: Self-pay

## 2023-03-14 ENCOUNTER — Ambulatory Visit (INDEPENDENT_AMBULATORY_CARE_PROVIDER_SITE_OTHER): Payer: 59 | Admitting: Nurse Practitioner

## 2023-03-14 VITALS — BP 120/82 | HR 82 | Ht 64.5 in | Wt 259.4 lb

## 2023-03-14 DIAGNOSIS — F411 Generalized anxiety disorder: Secondary | ICD-10-CM | POA: Diagnosis not present

## 2023-03-14 DIAGNOSIS — F339 Major depressive disorder, recurrent, unspecified: Secondary | ICD-10-CM | POA: Diagnosis not present

## 2023-03-14 DIAGNOSIS — R7303 Prediabetes: Secondary | ICD-10-CM | POA: Diagnosis not present

## 2023-03-14 DIAGNOSIS — L02419 Cutaneous abscess of limb, unspecified: Secondary | ICD-10-CM | POA: Diagnosis not present

## 2023-03-14 MED ORDER — DOXYCYCLINE HYCLATE 100 MG PO TABS
100.0000 mg | ORAL_TABLET | Freq: Two times a day (BID) | ORAL | 0 refills | Status: DC
Start: 2023-03-14 — End: 2023-04-06
  Filled 2023-03-14: qty 14, 7d supply, fill #0

## 2023-03-14 MED ORDER — ESCITALOPRAM OXALATE 10 MG PO TABS
ORAL_TABLET | ORAL | 3 refills | Status: DC
Start: 2023-03-14 — End: 2024-03-20
  Filled 2023-03-14: qty 90, 90d supply, fill #0
  Filled 2023-09-29: qty 90, 90d supply, fill #1

## 2023-03-14 MED ORDER — LORAZEPAM 0.5 MG PO TABS
0.2500 mg | ORAL_TABLET | Freq: Two times a day (BID) | ORAL | 0 refills | Status: DC | PRN
Start: 2023-03-14 — End: 2023-09-29
  Filled 2023-03-14: qty 30, 15d supply, fill #0

## 2023-03-14 MED ORDER — METFORMIN HCL 500 MG PO TABS
500.0000 mg | ORAL_TABLET | Freq: Every day | ORAL | 1 refills | Status: DC
  Filled 2023-03-14: qty 90, 90d supply, fill #0

## 2023-03-14 NOTE — Assessment & Plan Note (Signed)
Abscessed area of the left axilla with no drainage present at this time. Area appears swollen, erythematous, and very tender to light palpation. Unclear if this is possible HS or folliculitis. Will start treatment with doxycycline BID for 7 days and monitor. If patient experiences repeat of the lesion or similar lesions in the axilla or groin, will monitor testosterone levels and consider spironolactone.

## 2023-03-14 NOTE — Assessment & Plan Note (Signed)
Refill on lexapro and xanax. Mood is stable at this time with intermittent anxiety. Will continue to monitor.

## 2023-03-14 NOTE — Assessment & Plan Note (Signed)
Metformin refilled. Recommend taking every other day. Labs are pending. If A1c is elevated to DM level, consider GLP-1 for better treatment.

## 2023-03-14 NOTE — Progress Notes (Signed)
Tollie Eth, DNP, AGNP-c [facility] [address] [place], [place] [address] [phone]   ACUTE VISIT- ESTABLISHED PATIENT  Blood pressure 120/82, pulse 82, height 5' 4.5" (1.638 m), weight 259 lb 6.4 oz (117.7 kg), SpO2 98%.  Subjective:  HPI Brenda Navarro is a 37 y.o. female presents to day for evaluation of acute concern(s).    Brenda Navarro reports swelling, pain, and drainage from the left axilla with a hard knot under the skin. This initially started a couple of weeks ago with pain then slow started to drain. She reports the drainage was malodorous. She tells me the drainage has stopped at this point, but the knot does not seem to be getting smaller and is still very painful. This is the first time she has had this happen.   She also tells me that she has not been taking her medication as she knows she should. She would like to restart all of her medications and plans to take these regularly. She did have excessive diarrhea with the metformin, but is willing to retry this for her pre-dm.   ROS negative except for what is listed in HPI. History, Medications, Surgery, SDOH, and Family History reviewed and updated as appropriate.  Objective:  Physical Exam Vitals and nursing note reviewed.  Constitutional:      Appearance: Normal appearance.  HENT:     Head: Normocephalic.  Eyes:     Conjunctiva/sclera: Conjunctivae normal.  Musculoskeletal:        General: Swelling and tenderness present.  Skin:    General: Skin is warm and dry.     Comments: Left axilla reveals a 2cm erythematous nodularity under the skin. No drainage present.   Neurological:     Mental Status: She is alert.         Assessment & Plan:   Problem List Items Addressed This Visit     Prediabetes    Metformin refilled. Recommend taking every other day. Labs are pending. If A1c is elevated to DM level, consider GLP-1 for better treatment.       Relevant Orders   Hemoglobin A1c   CBC  with Differential/Platelet   Comprehensive metabolic panel   VITAMIN D 25 Hydroxy (Vit-D Deficiency, Fractures)   Generalized anxiety disorder    Refill on lexapro and xanax. Mood is stable at this time with intermittent anxiety. Will continue to monitor.       Relevant Medications   escitalopram (LEXAPRO) 10 MG tablet   LORazepam (ATIVAN) 0.5 MG tablet   Other Relevant Orders   Hemoglobin A1c   CBC with Differential/Platelet   Comprehensive metabolic panel   VITAMIN D 25 Hydroxy (Vit-D Deficiency, Fractures)   Axillary abscess - Primary    Abscessed area of the left axilla with no drainage present at this time. Area appears swollen, erythematous, and very tender to light palpation. Unclear if this is possible HS or folliculitis. Will start treatment with doxycycline BID for 7 days and monitor. If patient experiences repeat of the lesion or similar lesions in the axilla or groin, will monitor testosterone levels and consider spironolactone.       Relevant Medications   doxycycline (VIBRA-TABS) 100 MG tablet   Other Relevant Orders   Hemoglobin A1c   CBC with Differential/Platelet   Comprehensive metabolic panel   VITAMIN D 25 Hydroxy (Vit-D Deficiency, Fractures)   Other Visit Diagnoses     Depression, recurrent (HCC)       Relevant Medications   escitalopram (LEXAPRO) 10  MG tablet   LORazepam (ATIVAN) 0.5 MG tablet   Other Relevant Orders   Hemoglobin A1c   CBC with Differential/Platelet   Comprehensive metabolic panel   VITAMIN D 25 Hydroxy (Vit-D Deficiency, Fractures)         Time: 22 minutes, >50% spent counseling, care coordination, chart review, and documentation.   Tollie Eth, DNP, AGNP-c

## 2023-03-14 NOTE — Patient Instructions (Signed)
Hidradenitis Suppurativa Hidradenitis suppurativa is a long-term (chronic) skin disease. It is similar to a severe form of acne, but it affects areas of the body where acne would be unusual, especially areas of the body where skin rubs against skin and becomes moist. These include: Underarms. Groin. Genital area. Buttocks. Upper thighs. Breasts. Hidradenitis suppurativa may start out as small lumps or pimples caused by blocked skin pores, sweat glands, or hair follicles. Pimples may develop into deep sores that break open (rupture) and drain pus. Over time, affected areas of skin may thicken and become scarred. This condition is rare and does not spread from person to person (non-contagious). What are the causes? The exact cause of this condition is not known. It may be related to: Female and female hormones. An overactive disease-fighting system (immune system). The immune system may over-react to blocked hair follicles or sweat glands and cause swelling and pus-filled sores. What increases the risk? You are more likely to develop this condition if you: Are female. Are 11-55 years old. Have a family history of hidradenitis suppurativa. Have a personal history of acne. Are overweight. Smoke. Take the medicine lithium. What are the signs or symptoms? The first symptoms are usually painful bumps in the skin, similar to pimples. The condition may get worse over time (progress), or it may only cause mild symptoms. If the disease progresses, symptoms may include: Skin bumps getting bigger and growing deeper into the skin. Bumps rupturing and draining pus. Itchy, infected skin. Skin getting thicker and scarred. Tunnels under the skin (fistulas) where pus drains from a bump. Pain during daily activities, such as pain during walking if your groin area is affected. Emotional problems, such as stress or depression. This condition may affect your appearance and your ability or willingness to wear  certain clothes or do certain activities. How is this diagnosed? This condition is diagnosed by a health care provider who specializes in skin conditions (dermatologist). You may be diagnosed based on: Your symptoms and medical history. A physical exam. Testing a pus sample for infection. Blood tests. How is this treated? Your treatment will depend on how severe your symptoms are. The same treatment will not work for everybody with this condition. You may need to try several treatments to find what works best for you. Treatment may include: Cleaning and bandaging (dressing) your wounds as needed. Lifestyle changes, such as new skin care routines. Taking medicines, such as: Antibiotics. Acne medicines. Medicines to reduce the activity of the immune system. A diabetes medicine (metformin). Birth control pills, for women. Steroids to reduce swelling and pain. Working with a mental health care provider, if you experience emotional distress due to this condition. If you have severe symptoms that do not get better with medicine, you may need surgery. Surgery may involve: Using a laser to clear the skin and remove hair follicles. Opening and draining deep sores. Removing the areas of skin that are diseased and scarred. Follow these instructions at home: Medicines  Take over-the-counter and prescription medicines only as told by your health care provider. If you were prescribed antibiotics, take them as told by your health care provider. Do not stop using the antibiotic even if your condition improves. Skin care If you have open wounds, cover them with a clean dressing as told by your health care provider. Keep wounds clean by washing them gently with soap and water when you bathe. Do not shave the areas where you get hidradenitis suppurativa. Wear loose-fitting clothes. Try to avoid   getting overheated or sweaty. If you get sweaty or wet, change into clean, dry clothes as soon as you can. To  help relieve pain and itchiness, cover sore areas with a warm, clean washcloth (warm compress) for 5-10 minutes as often as needed. Your healthcare provider may recommend an antiperspirant deodorant that may be gentle on your skin. A daily antiseptic wash to cleanse affected areas may be suggested by your healthcare provider. General instructions Learn as much as you can about your disease so that you have an active role in your treatment. Work closely with your health care provider to find treatments that work for you. If you are overweight, work with your health care provider to lose weight as recommended. Do not use any products that contain nicotine or tobacco. These products include cigarettes, chewing tobacco, and vaping devices, such as e-cigarettes. If you need help quitting, ask your health care provider. If you struggle with living with this condition, talk with your health care provider or work with a mental health care provider as recommended. Keep all follow-up visits. Where to find more information Hidradenitis Suppurativa Foundation, Inc.: www.hs-foundation.org American Academy of Dermatology: www.aad.org Contact a health care provider if: You have a flare-up of hidradenitis suppurativa. You have a fever or chills. You have trouble controlling your symptoms at home. You have trouble doing your daily activities because of your symptoms. You have trouble dealing with emotional problems related to your condition. Summary Hidradenitis suppurativa is a long-term (chronic) skin disease. It is similar to a severe form of acne, but it affects areas of the body where acne would be unusual. The first symptoms are usually painful bumps in the skin, similar to pimples. The condition may only cause mild symptoms, or it may get worse over time (progress). If you have open wounds, cover them with a clean dressing as told by your health care provider. Keep wounds clean by washing them gently with  soap and water when you bathe. Besides skin care, treatment may include medicines, laser treatment, and surgery. This information is not intended to replace advice given to you by your health care provider. Make sure you discuss any questions you have with your health care provider. Document Revised: 08/18/2021 Document Reviewed: 08/18/2021 Elsevier Patient Education  2024 Elsevier Inc.  

## 2023-03-15 ENCOUNTER — Other Ambulatory Visit: Payer: Self-pay | Admitting: Nurse Practitioner

## 2023-03-15 DIAGNOSIS — E559 Vitamin D deficiency, unspecified: Secondary | ICD-10-CM

## 2023-03-15 LAB — CBC WITH DIFFERENTIAL/PLATELET
Basophils Absolute: 0 10*3/uL (ref 0.0–0.2)
Basos: 0 %
EOS (ABSOLUTE): 0.1 10*3/uL (ref 0.0–0.4)
Eos: 1 %
Hematocrit: 42.3 % (ref 34.0–46.6)
Hemoglobin: 13.2 g/dL (ref 11.1–15.9)
Immature Grans (Abs): 0 10*3/uL (ref 0.0–0.1)
Immature Granulocytes: 0 %
Lymphocytes Absolute: 2.3 10*3/uL (ref 0.7–3.1)
Lymphs: 30 %
MCH: 26.6 pg (ref 26.6–33.0)
MCHC: 31.2 g/dL — ABNORMAL LOW (ref 31.5–35.7)
MCV: 85 fL (ref 79–97)
Monocytes Absolute: 0.5 10*3/uL (ref 0.1–0.9)
Monocytes: 7 %
Neutrophils Absolute: 4.7 10*3/uL (ref 1.4–7.0)
Neutrophils: 62 %
Platelets: 367 10*3/uL (ref 150–450)
RBC: 4.96 x10E6/uL (ref 3.77–5.28)
RDW: 13.4 % (ref 11.7–15.4)
WBC: 7.6 10*3/uL (ref 3.4–10.8)

## 2023-03-15 LAB — COMPREHENSIVE METABOLIC PANEL
ALT: 11 IU/L (ref 0–32)
AST: 11 IU/L (ref 0–40)
Albumin: 4.3 g/dL (ref 3.9–4.9)
Alkaline Phosphatase: 128 IU/L — ABNORMAL HIGH (ref 44–121)
BUN/Creatinine Ratio: 9 (ref 9–23)
BUN: 9 mg/dL (ref 6–20)
Bilirubin Total: <0.2 mg/dL (ref 0.0–1.2)
CO2: 24 mmol/L (ref 20–29)
Calcium: 9.7 mg/dL (ref 8.7–10.2)
Chloride: 103 mmol/L (ref 96–106)
Creatinine, Ser: 0.97 mg/dL (ref 0.57–1.00)
Globulin, Total: 2.9 g/dL (ref 1.5–4.5)
Glucose: 99 mg/dL (ref 70–99)
Potassium: 4.5 mmol/L (ref 3.5–5.2)
Sodium: 140 mmol/L (ref 134–144)
Total Protein: 7.2 g/dL (ref 6.0–8.5)
eGFR: 78 mL/min/{1.73_m2} (ref >59)

## 2023-03-15 LAB — HEMOGLOBIN A1C
Est. average glucose Bld gHb Est-mCnc: 131 mg/dL
Hgb A1c MFr Bld: 6.2 % — ABNORMAL HIGH (ref 4.8–5.6)

## 2023-03-15 LAB — VITAMIN D 25 HYDROXY (VIT D DEFICIENCY, FRACTURES): Vit D, 25-Hydroxy: 29.3 ng/mL — ABNORMAL LOW (ref 30.0–100.0)

## 2023-03-15 MED ORDER — VITAMIN D (ERGOCALCIFEROL) 1.25 MG (50000 UNIT) PO CAPS
50000.0000 [IU] | ORAL_CAPSULE | ORAL | 3 refills | Status: DC
Start: 2023-03-15 — End: 2023-08-31
  Filled 2023-03-15: qty 12, 84d supply, fill #0

## 2023-03-16 ENCOUNTER — Other Ambulatory Visit (HOSPITAL_COMMUNITY): Payer: Self-pay

## 2023-03-28 ENCOUNTER — Other Ambulatory Visit (HOSPITAL_COMMUNITY): Payer: Self-pay

## 2023-04-01 ENCOUNTER — Encounter: Payer: Self-pay | Admitting: Nurse Practitioner

## 2023-04-06 ENCOUNTER — Ambulatory Visit (INDEPENDENT_AMBULATORY_CARE_PROVIDER_SITE_OTHER): Payer: 59 | Admitting: Nurse Practitioner

## 2023-04-06 ENCOUNTER — Other Ambulatory Visit (HOSPITAL_COMMUNITY): Payer: Self-pay

## 2023-04-06 ENCOUNTER — Encounter: Payer: Self-pay | Admitting: Nurse Practitioner

## 2023-04-06 VITALS — BP 126/80 | HR 84 | Wt 264.0 lb

## 2023-04-06 DIAGNOSIS — L02419 Cutaneous abscess of limb, unspecified: Secondary | ICD-10-CM | POA: Diagnosis not present

## 2023-04-06 MED ORDER — ONDANSETRON HCL 8 MG PO TABS
8.0000 mg | ORAL_TABLET | Freq: Three times a day (TID) | ORAL | 2 refills | Status: DC | PRN
Start: 2023-04-06 — End: 2023-08-21
  Filled 2023-04-06: qty 20, 7d supply, fill #0

## 2023-04-06 MED ORDER — CHLORHEXIDINE GLUCONATE 4 % EX SOLN
Freq: Every day | CUTANEOUS | 1 refills | Status: DC | PRN
Start: 2023-04-06 — End: 2024-01-15
  Filled 2023-04-06: qty 236, fill #0

## 2023-04-06 MED ORDER — TRAMADOL HCL 50 MG PO TABS
50.0000 mg | ORAL_TABLET | Freq: Four times a day (QID) | ORAL | 0 refills | Status: DC | PRN
Start: 2023-04-06 — End: 2023-08-21
  Filled 2023-04-06: qty 60, 15d supply, fill #0

## 2023-04-06 MED ORDER — MUPIROCIN 2 % EX OINT
TOPICAL_OINTMENT | CUTANEOUS | 0 refills | Status: DC
Start: 2023-04-06 — End: 2023-08-21
  Filled 2023-04-06: qty 30, 7d supply, fill #0
  Filled 2023-04-06: qty 22, 7d supply, fill #0

## 2023-04-06 MED ORDER — SULFAMETHOXAZOLE-TRIMETHOPRIM 800-160 MG PO TABS
1.0000 | ORAL_TABLET | Freq: Two times a day (BID) | ORAL | 0 refills | Status: DC
Start: 2023-04-06 — End: 2023-08-21
  Filled 2023-04-06: qty 20, 10d supply, fill #0

## 2023-04-06 NOTE — Patient Instructions (Addendum)
Skin Abscess  A skin abscess is an infected area on or under your skin. It contains pus and other material. An abscess may also be called a furuncle, carbuncle, or boil. It is often the result of an infection caused by bacteria. An abscess can occur in or on almost any part of your body. Sometimes, an abscess may break open (rupture) on its own. In most cases, it will keep getting worse unless it is treated. An abscess can cause pain and make you feel ill. An untreated abscess can cause infection to spread to other parts of your body or your bloodstream. The abscess may need to be drained. You may also need to take antibiotics. What are the causes? An abscess occurs when germs, like bacteria, pass through your skin and cause an infection. This may be caused by: A scrape or cut on your skin. A puncture wound through your skin, such as a needle injection or insect bite. Blocked oil or sweat glands. Blocked and infected hair follicles. A fluid-filled sac that forms beneath your skin (sebaceous cyst) and becomes infected. What increases the risk? You may be more likely to develop an abscess if: You have problems with blood circulation, or you have a weak body defense system (immune system). You have diabetes. You have dry and irritated skin. You get injections often or use IV drugs. You have a foreign body in a wound, such as a splinter. You smoke or use tobacco products. What are the signs or symptoms? Symptoms of this condition include: A painful, firm bump under the skin. A bump with pus at the top. This may break through the skin and drain. Other symptoms include: Redness and swelling around the abscess. Warmth or tenderness. Swelling of the lymph nodes (glands) near the abscess. A sore on the skin. How is this diagnosed? This condition may be diagnosed based on a physical exam and your medical history. You may also have tests done, such as: A test of a sample of pus. This may be done  to find what is causing the infection. Blood tests. Imaging tests, such as an ultrasound, CT scan, or MRI. How is this treated? A small abscess that drains on its own may not need to be treated. Treatment for larger abscesses may include: Moist heat or a heat pack applied to the area a few times a day. Incision and drainage. This is a procedure to drain the abscess. Antibiotics. For a severe abscess, you may first get antibiotics through an IV and then change to antibiotics by mouth. Follow these instructions at home: Medicines Take over-the-counter and prescription medicines only as told by your provider. If you were prescribed antibiotics, take them as told by your provider. Do not stop using the antibiotic even if you start to feel better. Abscess care  If you have an abscess that has not drained, apply heat to the affected area. Use the heat source that your provider recommends, such as a moist heat pack or a heating pad. Place a towel between your skin and the heat source. Leave the heat on for 20-30 minutes at a time. If your skin turns bright red, remove the heat right away to prevent burns. The risk of burns is higher if you cannot feel pain, heat, or cold. Follow instructions from your provider about how to take care of your abscess. Make sure you: Cover the abscess with a bandage (dressing). Wash your hands with soap and water for at least 20 seconds before  and after you change the dressing or gauze. If soap and water are not available, use hand sanitizer. Change your dressing or gauze as told by your provider. Check your abscess every day for signs of an infection that is getting worse. Check for: More redness, swelling, pain, or tenderness. More fluid or blood. Warmth. More pus or a worse smell. General instructions To avoid spreading the infection: Do not share personal care items, towels, or hot tubs with others. Avoid making skin contact with other people. Be careful  when getting rid of used dressings, wound packing, or any drainage from the abscess. Do not use any products that contain nicotine or tobacco. These products include cigarettes, chewing tobacco, and vaping devices, such as e-cigarettes. If you need help quitting, ask your provider. Do not use any creams, ointments, or liquids unless you have been told to by your provider. Contact a health care provider if: You see redness that spreads quickly or red streaks on your skin spreading away from the abscess. You have any signs of worse infection at the abscess. You vomit every time you eat or drink. You have a fever, chills, or muscle aches. The cyst or abscess returns. Get help right away if: You have severe pain. You make less pee (urine) than normal. This information is not intended to replace advice given to you by your health care provider. Make sure you discuss any questions you have with your health care provider. Document Revised: 02/09/2022 Document Reviewed: 02/09/2022 Elsevier Patient Education  2024 [employer].

## 2023-04-06 NOTE — Progress Notes (Signed)
Tollie Eth, DNP, AGNP-c [facility] [address] [place], [place] [address] [phone]   ACUTE VISIT- ESTABLISHED PATIENT  Blood pressure 126/80, pulse 84, weight 264 lb (119.7 kg).  Subjective:  HPI Brenda Navarro is a 37 y.o. female presents to day for evaluation of acute concern(s).   History of Present Illness Brenda Navarro, with a history of recurrent skin abscesses, presents with a new painful, pulsatile, and warm lesion under the left arm. The lesion started as a small lump under the skin, associated with a burning sensation. Over time, it grew larger and began to drain pus with an unpleasant odor. The patient describes the discharge as initially thin and milky, later becoming thick and cottage cheese-like. The patient also reports a smaller lesion in the axilla, which is currently asymptomatic but associated with a similar burning sensation, indicating impending development.  The patient's previous abscess was treated with doxycycline, which seemed to clear the infection but caused significant nausea. The patient has had multiple similar abscesses in the past few weeks, each resolving on its own or with antibiotic treatment, only for a new one to appear shortly after. The abscesses have significantly impacted the patient's quality of life, causing severe pain and limiting her ability to participate in regular activities.  The patient denies working in a hospital or any other high-risk environment for MRSA colonization. She also denies any other new symptoms or changes in overall health. The patient's medical history is otherwise unremarkable.  ROS negative except for what is listed in HPI. History, Medications, Surgery, SDOH, and Family History reviewed and updated as appropriate.  Objective:  Physical Exam Vitals and nursing note reviewed.  Constitutional:      Appearance: Normal appearance. She is obese.  Eyes:     Conjunctiva/sclera: Conjunctivae normal.   Cardiovascular:     Rate and Rhythm: Normal rate and regular rhythm.  Pulmonary:     Effort: Pulmonary effort is normal.  Skin:    General: Skin is warm and dry.     Capillary Refill: Capillary refill takes less than 2 seconds.     Findings: Abscess present.       Neurological:     General: No focal deficit present.     Mental Status: She is alert and oriented to person, place, and time.  Psychiatric:        Mood and Affect: Mood normal.        Behavior: Behavior normal.         Assessment & Plan:   Problem List Items Addressed This Visit     Axillary abscess - Primary    Suspected recurrent staph infection, possibly MRSA, presenting as painful abscesses. The abscesses are characterized by redness, warmth, pus, and a throbbing sensation. The patient reports a new lesion developing under the arm. Consider possible hidradenitis if these continue, at this time there is no tracking present to indicate this.  -Start Bactrim for the current abscess and send in Zofran for associated nausea. -Use chlorhexidine (Hibiclens) external liquid for skin cleansing, focusing on underarms, under breasts, and around the neck. -Apply mupirocin ointment inside each nostril at bedtime for seven days to eliminate potential MRSA colonization. -For pain management, take 1-2 of the prescribed pain medication as needed. -If new abscesses develop, contact the office immediately.      Relevant Medications   sulfamethoxazole-trimethoprim (BACTRIM DS) 800-160 MG tablet   chlorhexidine (HIBICLENS) 4 % external liquid   ondansetron (ZOFRAN) 8 MG tablet   mupirocin ointment (  BACTROBAN) 2 %   traMADol (ULTRAM) 50 MG tablet     Tollie Eth, DNP, AGNP-c

## 2023-04-10 NOTE — Assessment & Plan Note (Signed)
Suspected recurrent staph infection, possibly MRSA, presenting as painful abscesses. The abscesses are characterized by redness, warmth, pus, and a throbbing sensation. The patient reports a new lesion developing under the arm. Consider possible hidradenitis if these continue, at this time there is no tracking present to indicate this.  -Start Bactrim for the current abscess and send in Zofran for associated nausea. -Use chlorhexidine (Hibiclens) external liquid for skin cleansing, focusing on underarms, under breasts, and around the neck. -Apply mupirocin ointment inside each nostril at bedtime for seven days to eliminate potential MRSA colonization. -For pain management, take 1-2 of the prescribed pain medication as needed. -If new abscesses develop, contact the office immediately.

## 2023-08-21 ENCOUNTER — Ambulatory Visit: Payer: 59 | Admitting: Nurse Practitioner

## 2023-08-21 ENCOUNTER — Encounter: Payer: Self-pay | Admitting: Nurse Practitioner

## 2023-08-21 VITALS — BP 122/82 | HR 79 | Wt 264.2 lb

## 2023-08-21 DIAGNOSIS — E538 Deficiency of other specified B group vitamins: Secondary | ICD-10-CM

## 2023-08-21 DIAGNOSIS — R7303 Prediabetes: Secondary | ICD-10-CM

## 2023-08-21 DIAGNOSIS — R42 Dizziness and giddiness: Secondary | ICD-10-CM | POA: Diagnosis not present

## 2023-08-21 DIAGNOSIS — E559 Vitamin D deficiency, unspecified: Secondary | ICD-10-CM | POA: Diagnosis not present

## 2023-08-21 DIAGNOSIS — R531 Weakness: Secondary | ICD-10-CM

## 2023-08-21 NOTE — Assessment & Plan Note (Signed)
 Intermittent fatigue and dizziness for the past 1-1.5 months. No associated fevers, chills, chest pain, shortness of breath, or recent illness. Differential diagnosis includes anemia (B12 deficiency, iron deficiency), thyroid dysfunction, and diabetes. 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 Discussed the importance of ruling out these conditions through lab tests. - Order CBC, B12, iron levels, thyroid function tests, and HbA1c - Review lab results and follow up to discuss findings and next steps

## 2023-08-21 NOTE — Assessment & Plan Note (Signed)
 Repeat labs today to ensure this is not contributing to symptoms.

## 2023-08-21 NOTE — Progress Notes (Signed)
 Annella Kief, DNP, AGNP-c [facility] [address] [place], [place] [address] [phone]   ACUTE VISIT- ESTABLISHED PATIENT  Blood pressure 122/82, pulse 79, weight 264 lb 3.2 oz (119.8 kg).  Subjective:  HPI Brenda Navarro is a 38 y.o. female presents to day for evaluation of acute concern(s).  Weakness, dizziness.   History of Present Illness Brenda Navarro is a 38 year old female with prediabetes who presents with low energy and headaches.  She has been experiencing low energy and headaches intermittently for the past month to month and a half, accompanied by dizziness and a general feeling of malaise. She finds some relief with Tylenol  and rest, but the symptoms recur unpredictably. No fevers, chills, chest pain, or shortness of breath. Minor puffiness occurs at the end of a shift, but no significant swelling in her feet.  Her sleep pattern is disrupted due to working night shifts, leading to most of her sleep occurring during the day. She finds it challenging to switch between night and day schedules, affecting her sleep quality.  She has a history of prediabetes and low vitamin D , for which she takes supplements without noticing any improvement in her symptoms. Her blood pressure has always been good. No numbness, tingling, or significant vision changes, although she received a new prescription for glasses in November due to vision changes.  She has not had a menstrual period for an unspecified duration.  ROS negative except for what is listed in HPI. History, Medications, Surgery, SDOH, and Family History reviewed and updated as appropriate.  Objective:  Physical Exam Vitals and nursing note reviewed.  Constitutional:      General: She is not in acute distress.    Appearance: Normal appearance. She is not ill-appearing.  HENT:     Head: Normocephalic.     Nose: Nose normal.     Mouth/Throat:     Mouth: Mucous membranes are moist.     Pharynx:  Oropharynx is clear.  Eyes:     Conjunctiva/sclera: Conjunctivae normal.  Neck:     Vascular: No carotid bruit.  Cardiovascular:     Rate and Rhythm: Normal rate and regular rhythm.     Pulses: Normal pulses.     Heart sounds: Normal heart sounds.  Pulmonary:     Effort: Pulmonary effort is normal.     Breath sounds: Normal breath sounds.  Abdominal:     General: Bowel sounds are normal.     Palpations: Abdomen is soft.  Musculoskeletal:        General: Normal range of motion.     Right lower leg: No edema.     Left lower leg: No edema.  Skin:    General: Skin is warm and dry.     Capillary Refill: Capillary refill takes less than 2 seconds.  Neurological:     General: No focal deficit present.     Mental Status: She is alert and oriented to person, place, and time.  Psychiatric:        Mood and Affect: Mood normal.         Assessment & Plan:   Problem List Items Addressed This Visit     Weakness generalized   Relevant Orders   CBC with Differential/Platelet   Comprehensive metabolic panel   Iron, TIBC and Ferritin Panel   TSH   Vitamin B12   VITAMIN D  25 Hydroxy (Vit-D Deficiency, Fractures)   Prediabetes - Primary   Repeat labs today to ensure this is not  contributing to symptoms.       Relevant Orders   Hemoglobin A1c   Dizziness   Intermittent fatigue and dizziness for the past 1-1.5 months. No associated fevers, chills, chest pain, shortness of breath, or recent illness. Differential diagnosis includes anemia (B12 deficiency, iron deficiency), thyroid dysfunction, and diabetes. 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 Discussed the importance of ruling out these conditions through lab tests. - Order CBC, B12, iron levels, thyroid function tests, and HbA1c - Review lab results and follow up to discuss findings and next steps      Relevant Orders   CBC with Differential/Platelet   Comprehensive metabolic panel   Iron, TIBC and Ferritin Panel   TSH    Vitamin B12   VITAMIN D  25 Hydroxy (Vit-D Deficiency, Fractures)   Vitamin B12 deficiency   Relevant Orders   Vitamin B12   Vitamin D  deficiency   Relevant Orders   VITAMIN D  25 Hydroxy (Vit-D Deficiency, Fractures)      Annella Kief, DNP, AGNP-c

## 2023-08-21 NOTE — Patient Instructions (Addendum)
 We will check labs today and see how things look.  Weakness Weakness is a lack of strength. You may feel weak all over your body (generalized), or you may feel weak in one part of your body (focal). There are many potential causes of weakness. Sometimes, the cause of your weakness may not be known. Some causes of weakness can be serious, so it is important to see your doctor. Follow these instructions at home: Activity Rest as needed. Try to get enough sleep. Most adults need 7-8 hours of sleep each night. Talk to your doctor about how much sleep you need. Do exercises, such as arm curls and leg raises, for 30 minutes at least 2 days a week or as told by your doctor. Think about working with a physical therapist or trainer to help you get stronger. General instructions  Take over-the-counter and prescription medicines only as told by your doctor. Eat a healthy, well-balanced diet. This includes: Proteins to build muscles, such as lean meats and fish. Fresh fruits and vegetables. Carbohydrates to boost energy, such as whole grains. Drink enough fluid to keep your pee (urine) pale yellow. Keep all follow-up visits. Contact a doctor if: Your weakness does not get better or it gets worse. Your weakness affects your ability to: Think clearly. Do your normal daily activities. Get help right away if: You have sudden weakness on one side of your face or body. You have chest pain. You have trouble breathing or shortness of breath. You have problems with how you see (vision). You have trouble talking or swallowing. You have trouble standing or walking. You are light-headed or faint. These symptoms may be an emergency. Get help right away. Call 911. Do not wait to see if the symptoms will go away. Do not drive yourself to the hospital. Summary Weakness is a lack of strength. You may feel weak all over your body or just in one part of your body. There are many potential causes of weakness.  Sometimes, the cause of your weakness may not be known. Rest as needed, and try to get enough sleep. Most adults need 7-8 hours of sleep each night. Eat a healthy, well-balanced diet. This information is not intended to replace advice given to you by your health care provider. Make sure you discuss any questions you have with your health care provider. Document Revised: 05/30/2021 Document Reviewed: 05/30/2021 Elsevier Patient Education  2024 [employer].

## 2023-08-22 LAB — CBC WITH DIFFERENTIAL/PLATELET
Basophils Absolute: 0 10*3/uL (ref 0.0–0.2)
Basos: 1 %
EOS (ABSOLUTE): 0.1 10*3/uL (ref 0.0–0.4)
Eos: 1 %
Hematocrit: 40.3 % (ref 34.0–46.6)
Hemoglobin: 12.6 g/dL (ref 11.1–15.9)
Immature Grans (Abs): 0 10*3/uL (ref 0.0–0.1)
Immature Granulocytes: 0 %
Lymphocytes Absolute: 2.6 10*3/uL (ref 0.7–3.1)
Lymphs: 36 %
MCH: 26.3 pg — ABNORMAL LOW (ref 26.6–33.0)
MCHC: 31.3 g/dL — ABNORMAL LOW (ref 31.5–35.7)
MCV: 84 fL (ref 79–97)
Monocytes Absolute: 0.5 10*3/uL (ref 0.1–0.9)
Monocytes: 7 %
Neutrophils Absolute: 4 10*3/uL (ref 1.4–7.0)
Neutrophils: 55 %
Platelets: 351 10*3/uL (ref 150–450)
RBC: 4.8 x10E6/uL (ref 3.77–5.28)
RDW: 13.1 % (ref 11.7–15.4)
WBC: 7.2 10*3/uL (ref 3.4–10.8)

## 2023-08-22 LAB — COMPREHENSIVE METABOLIC PANEL
ALT: 10 [IU]/L (ref 0–32)
AST: 10 [IU]/L (ref 0–40)
Albumin: 3.9 g/dL (ref 3.9–4.9)
Alkaline Phosphatase: 104 [IU]/L (ref 44–121)
BUN/Creatinine Ratio: 12 (ref 9–23)
BUN: 10 mg/dL (ref 6–20)
Bilirubin Total: 0.3 mg/dL (ref 0.0–1.2)
CO2: 22 mmol/L (ref 20–29)
Calcium: 9.2 mg/dL (ref 8.7–10.2)
Chloride: 105 mmol/L (ref 96–106)
Creatinine, Ser: 0.81 mg/dL (ref 0.57–1.00)
Globulin, Total: 2.4 g/dL (ref 1.5–4.5)
Glucose: 110 mg/dL — ABNORMAL HIGH (ref 70–99)
Potassium: 4.1 mmol/L (ref 3.5–5.2)
Sodium: 141 mmol/L (ref 134–144)
Total Protein: 6.3 g/dL (ref 6.0–8.5)
eGFR: 96 mL/min/{1.73_m2} (ref >59)

## 2023-08-22 LAB — IRON,TIBC AND FERRITIN PANEL
Ferritin: 51 ng/mL (ref 15–150)
Iron Saturation: 22 % (ref 15–55)
Iron: 51 ug/dL (ref 27–159)
Total Iron Binding Capacity: 228 ug/dL — ABNORMAL LOW (ref 250–450)
UIBC: 177 ug/dL (ref 131–425)

## 2023-08-22 LAB — HEMOGLOBIN A1C
Est. average glucose Bld gHb Est-mCnc: 134 mg/dL
Hgb A1c MFr Bld: 6.3 % — ABNORMAL HIGH (ref 4.8–5.6)

## 2023-08-22 LAB — VITAMIN D 25 HYDROXY (VIT D DEFICIENCY, FRACTURES): Vit D, 25-Hydroxy: 17 ng/mL — ABNORMAL LOW (ref 30.0–100.0)

## 2023-08-22 LAB — TSH: TSH: 3.42 u[IU]/mL (ref 0.450–4.500)

## 2023-08-22 LAB — VITAMIN B12: Vitamin B-12: 270 pg/mL (ref 232–1245)

## 2023-08-31 ENCOUNTER — Other Ambulatory Visit (HOSPITAL_COMMUNITY): Payer: Self-pay

## 2023-08-31 ENCOUNTER — Encounter: Payer: Self-pay | Admitting: Nurse Practitioner

## 2023-08-31 MED ORDER — VITAMIN D (ERGOCALCIFEROL) 1.25 MG (50000 UNIT) PO CAPS
50000.0000 [IU] | ORAL_CAPSULE | ORAL | 3 refills | Status: AC
  Filled 2023-08-31: qty 12, 84d supply, fill #0

## 2023-08-31 MED ORDER — CYANOCOBALAMIN 1000 MCG/ML IJ SOLN: 1000.0000 ug | INTRAMUSCULAR | Status: DC

## 2023-08-31 NOTE — Addendum Note (Signed)
Addended by: Edana Aguado, Huntley Dec E on: 08/31/2023 09:11 AM   Modules accepted: Orders

## 2023-09-01 ENCOUNTER — Other Ambulatory Visit (HOSPITAL_COMMUNITY): Payer: Self-pay

## 2023-09-01 ENCOUNTER — Other Ambulatory Visit: Payer: Self-pay

## 2023-09-01 MED ORDER — CYANOCOBALAMIN 1000 MCG/ML IJ SOLN
1000.0000 ug | Freq: Once | INTRAMUSCULAR | 11 refills | Status: AC
  Filled 2023-09-01 (×2): qty 1, 1d supply, fill #0

## 2023-09-01 MED ORDER — BD ECLIPSE SYRINGE 25G X 1" 3 ML MISC
1.0000 | 0 refills | Status: AC
  Filled 2023-09-01: qty 12, 12d supply, fill #0

## 2023-09-29 ENCOUNTER — Other Ambulatory Visit (HOSPITAL_COMMUNITY): Payer: Self-pay

## 2023-09-29 ENCOUNTER — Other Ambulatory Visit: Payer: Self-pay | Admitting: Nurse Practitioner

## 2023-09-29 DIAGNOSIS — F339 Major depressive disorder, recurrent, unspecified: Secondary | ICD-10-CM

## 2023-09-29 DIAGNOSIS — F411 Generalized anxiety disorder: Secondary | ICD-10-CM

## 2023-09-29 NOTE — Telephone Encounter (Signed)
 Last apt 08/21/23

## 2023-10-02 ENCOUNTER — Other Ambulatory Visit: Payer: Self-pay

## 2023-10-02 ENCOUNTER — Other Ambulatory Visit (HOSPITAL_COMMUNITY): Payer: Self-pay

## 2023-10-02 MED ORDER — LORAZEPAM 0.5 MG PO TABS
0.2500 mg | ORAL_TABLET | Freq: Two times a day (BID) | ORAL | 0 refills | Status: DC | PRN
  Filled 2023-10-02: qty 30, 15d supply, fill #0

## 2023-10-11 ENCOUNTER — Other Ambulatory Visit (HOSPITAL_COMMUNITY): Payer: Self-pay

## 2023-10-12 ENCOUNTER — Other Ambulatory Visit (HOSPITAL_COMMUNITY): Payer: Self-pay

## 2023-12-14 ENCOUNTER — Emergency Department (HOSPITAL_COMMUNITY)

## 2023-12-14 ENCOUNTER — Other Ambulatory Visit: Payer: Self-pay

## 2023-12-14 ENCOUNTER — Emergency Department (HOSPITAL_COMMUNITY)
Admission: EM | Admit: 2023-12-14 | Discharge: 2023-12-15 | Discharge status: Against Medical Advice | Attending: Emergency Medicine | Admitting: Emergency Medicine

## 2023-12-14 DIAGNOSIS — R0602 Shortness of breath: Secondary | ICD-10-CM | POA: Insufficient documentation

## 2023-12-14 DIAGNOSIS — R079 Chest pain, unspecified: Secondary | ICD-10-CM | POA: Insufficient documentation

## 2023-12-14 DIAGNOSIS — R002 Palpitations: Secondary | ICD-10-CM | POA: Diagnosis not present

## 2023-12-14 DIAGNOSIS — M542 Cervicalgia: Secondary | ICD-10-CM | POA: Diagnosis not present

## 2023-12-14 DIAGNOSIS — Z5321 Procedure and treatment not carried out due to patient leaving prior to being seen by health care provider: Secondary | ICD-10-CM | POA: Insufficient documentation

## 2023-12-14 LAB — BASIC METABOLIC PANEL WITH GFR
Anion gap: 11 (ref 5–15)
BUN: 10 mg/dL (ref 6–20)
CO2: 21 mmol/L — ABNORMAL LOW (ref 22–32)
Calcium: 9.1 mg/dL (ref 8.9–10.3)
Chloride: 104 mmol/L (ref 98–111)
Creatinine, Ser: 0.86 mg/dL (ref 0.44–1.00)
GFR, Estimated: >60 mL/min (ref >60)
Glucose, Bld: 159 mg/dL — ABNORMAL HIGH (ref 70–99)
Potassium: 3.9 mmol/L (ref 3.5–5.1)
Sodium: 136 mmol/L (ref 135–145)

## 2023-12-14 LAB — CBC
HCT: 40.3 % (ref 36.0–46.0)
Hemoglobin: 12.7 g/dL (ref 12.0–15.0)
MCH: 26.8 pg (ref 26.0–34.0)
MCHC: 31.5 g/dL (ref 30.0–36.0)
MCV: 85.2 fL (ref 80.0–100.0)
Platelets: 346 10*3/uL (ref 150–400)
RBC: 4.73 MIL/uL (ref 3.87–5.11)
RDW: 14.6 % (ref 11.5–15.5)
WBC: 8.8 10*3/uL (ref 4.0–10.5)
nRBC: 0 % (ref 0.0–0.2)

## 2023-12-14 LAB — HCG, SERUM, QUALITATIVE: Preg, Serum: NEGATIVE

## 2023-12-14 LAB — TROPONIN I (HIGH SENSITIVITY): Troponin I (High Sensitivity): <2 ng/L (ref <18)

## 2023-12-14 NOTE — ED Triage Notes (Signed)
 Patient reports palpitations and left chest pain/left neck pain this evening with mild SOB , no emesis or diaphoresis .

## 2023-12-15 LAB — TROPONIN I (HIGH SENSITIVITY): Troponin I (High Sensitivity): 4 ng/L (ref <18)

## 2023-12-15 NOTE — ED Notes (Signed)
Pt left AMA due to long ED wait times.

## 2024-01-15 ENCOUNTER — Telehealth: Admitting: Physician Assistant

## 2024-01-15 ENCOUNTER — Other Ambulatory Visit (HOSPITAL_COMMUNITY): Payer: Self-pay

## 2024-01-15 DIAGNOSIS — J020 Streptococcal pharyngitis: Secondary | ICD-10-CM

## 2024-01-15 MED ORDER — AMOXICILLIN 500 MG PO CAPS
500.0000 mg | ORAL_CAPSULE | Freq: Two times a day (BID) | ORAL | 0 refills | Status: AC
  Filled 2024-01-15: qty 20, 10d supply, fill #0

## 2024-01-15 NOTE — Progress Notes (Signed)

## 2024-03-20 ENCOUNTER — Ambulatory Visit: Admitting: Medical

## 2024-03-20 ENCOUNTER — Other Ambulatory Visit (HOSPITAL_COMMUNITY): Payer: Self-pay

## 2024-03-20 VITALS — BP 124/80 | HR 87 | Temp 98.1°F | Wt 262.6 lb

## 2024-03-20 DIAGNOSIS — R350 Frequency of micturition: Secondary | ICD-10-CM

## 2024-03-20 DIAGNOSIS — E559 Vitamin D deficiency, unspecified: Secondary | ICD-10-CM | POA: Diagnosis not present

## 2024-03-20 DIAGNOSIS — R42 Dizziness and giddiness: Secondary | ICD-10-CM | POA: Diagnosis not present

## 2024-03-20 DIAGNOSIS — E538 Deficiency of other specified B group vitamins: Secondary | ICD-10-CM | POA: Diagnosis not present

## 2024-03-20 DIAGNOSIS — R631 Polydipsia: Secondary | ICD-10-CM | POA: Diagnosis not present

## 2024-03-20 DIAGNOSIS — R7303 Prediabetes: Secondary | ICD-10-CM

## 2024-03-20 LAB — POCT GLYCOSYLATED HEMOGLOBIN (HGB A1C): Hemoglobin A1C: 5.8 % — AB (ref 4.0–5.6)

## 2024-03-20 LAB — POCT HEMOGLOBIN: Hemoglobin: 10.9 g/dL — AB (ref 11–14.6)

## 2024-03-20 LAB — POCT URINALYSIS DIP (PROADVANTAGE DEVICE)
Bilirubin, UA: NEGATIVE
Blood, UA: NEGATIVE
Glucose, UA: NEGATIVE mg/dL
Ketones, POC UA: NEGATIVE mg/dL
Nitrite, UA: NEGATIVE
Protein Ur, POC: NEGATIVE mg/dL
Specific Gravity, Urine: 1.015
Urobilinogen, Ur: NEGATIVE
pH, UA: 7.5 (ref 5.0–8.0)

## 2024-03-20 LAB — GLUCOSE, POCT (MANUAL RESULT ENTRY): POC Glucose: 104 mg/dL — AB (ref 70–99)

## 2024-03-20 MED ORDER — CYANOCOBALAMIN 1000 MCG/ML IJ SOLN
INTRAMUSCULAR | 2 refills | Status: AC
  Filled 2024-03-20 (×2): qty 4, 84d supply, fill #0
  Filled 2024-06-07: qty 4, 84d supply, fill #1

## 2024-03-20 MED ORDER — CYANOCOBALAMIN 1000 MCG/ML IJ SOLN
1000.0000 ug | INTRAMUSCULAR | 5 refills | Status: DC
  Filled 2024-03-20: qty 1, 30d supply, fill #0

## 2024-03-20 NOTE — Progress Notes (Signed)
 Name: Brenda Navarro   Date of Visit: 03/20/24   Date of last visit with me: Visit date not found   CHIEF COMPLAINT:  Chief Complaint  Patient presents with   Acute Visit    Frequent urination, thirsty all the time and dry, headaches and sometimes feeling off,  eating a lot but not feeling full and then feels nauseated, off and on for a couple weeks, seen Brenda Navarro for this back earlier this year       HPI:  Discussed the use of AI scribe software for clinical note transcription with the patient, who gave verbal consent to proceed.  History of Present Illness     History of Present Illness Brenda Navarro is a 38 year old female with prediabetes who presents with increased thirst, frequent urination, and feeling faint.  She has been experiencing increased thirst, frequent urination, and feeling faint, along with some cognitive difficulties described as 'a little mind fog'. Her last hemoglobin A1c in February 2025 was 6.3%, and she does not monitor her blood glucose levels at home. There are no symptoms of a urinary tract infection, such as burning, blood, or odor in her urine.  She manages her prediabetes through dietary modifications and regular exercise. She has eliminated full sugar beverages, opting for sugar-free alternatives, and has been making healthier food choices, avoiding fried foods since her gallbladder removal. She previously tried metformin , but it caused gastrointestinal discomfort even at a reduced dose.  She experiences occasional palpitations described as 'weird beats' of her heart. She had an EKG performed in June 2025. She has not consulted a cardiologist or used a Zio monitor. Her episodes of feeling faint occur intermittently without a specific pattern and are not associated with the palpitations.  She is on vitamin D  supplementation due to a previously low level and receives B12 injections monthly, although she missed doses after February 2025. She does not take  oral B12 currently. She quit smoking a few years ago and does not consume dairy products, opting instead for a calcium supplement.  Past Medical History:  Diagnosis Date   Abnormal Pap smear    Abnormal Pap smear of cervix 04/19/2021   Back pain    Common bile duct (CBD) obstruction    Common bile duct calculus 03/08/2022   Dehydration 03/08/2022   Depression    Encounter for annual physical exam 09/09/2022   Obesity    Other fatigue 03/05/2020   Right upper quadrant abdominal pain 03/08/2022   Uses contraceptive implant for birth control 09/09/2022   Uterine perforation by intrauterine contraceptive device 2017   Current Outpatient Medications on File Prior to Visit  Medication Sig Dispense Refill   Vitamin D , Ergocalciferol , (DRISDOL ) 1.25 MG (50000 UNIT) CAPS capsule Take 1 capsule (50,000 Units total) by mouth every 7 (seven) days. 12 capsule 3   SYRINGE-NEEDLE, DISP, 3 ML (BD ECLIPSE SYRINGE) 25G X 1 3 ML MISC Use as directed. 12 each 0   No current facility-administered medications on file prior to visit.    Results LABS Hemoglobin A1c: 6.3% (08/2023) Vitamin D : low (08/2023) Blood glucose: 104 mg/dL (90/89/7974) Hemoglobin A1c: 5.8% (03/20/2024) Hemoglobin: 10.9 g/dL (90/89/7974)     Assessment and Plan Encounter Diagnoses  Name Primary?   Prediabetes Yes   Vitamin B12 deficiency    Urine frequency    Lightheaded    Vitamin D  deficiency    Polydipsia    We did test her recent symptoms including lightheaded, by dipstick, urinary frequency.  She had an EKG earlier in the year that was reviewed.  No major indication to do additional cardiac testing at this time.  Will focus on doing better with B12 treatment, treating prediabetes, and lifestyle changes  Prediabetes A1c improved to 5.8%, still prediabetic. Symptoms may relate to diet, caffeine, or hydration. Metformin  not tolerated. Lifestyle changes emphasized to prevent diabetes. - Monitor hemoglobin A1c  regularly. - Encourage dietary modifications: increase fruits, vegetables, lean meats, whole grains. - Advise reduction of caffeine and energy drink consumption. - Encourage regular exercise, weight-bearing and aerobic, at least four days a week. - Consider tracking caloric intake with an app, daily limit 1200 calories.  Vitamin B12 deficiency with associated anemia Anemia likely due to B12 deficiency. Inconsistent B12 injections. Symptoms include fatigue and dizziness. No significant bleeding or menstruation due to Nexplanon . - Resume monthly B12 injections. - Consider multivitamin supplementation.  Vitamin D  deficiency Vitamin D  was low, currently on supplementation. - Continue vitamin D  supplementation.  Brenda Navarro was seen today for acute visit.  Diagnoses and all orders for this visit:  Prediabetes  Vitamin B12 deficiency -     Discontinue: cyanocobalamin  (VITAMIN B12) 1000 MCG/ML injection; Inject 1 mL (1,000 mcg total) into the muscle every 30 (thirty) days. -     cyanocobalamin  (VITAMIN B12) 1000 MCG/ML injection; Every 14 days x 1 month, then monthly  Urine frequency -     POCT Urinalysis DIP (Proadvantage Device) -     HgB A1c -     Glucose (CBG)  Lightheaded -     POCT hemoglobin  Vitamin D  deficiency  Polydipsia   F/u 5mo with PCP

## 2024-05-07 ENCOUNTER — Other Ambulatory Visit (HOSPITAL_COMMUNITY): Payer: Self-pay

## 2024-05-07 MED ORDER — FLUZONE 0.5 ML IM SUSY
PREFILLED_SYRINGE | INTRAMUSCULAR | 0 refills | Status: AC
  Filled 2024-05-07: qty 0.5, 1d supply, fill #0

## 2024-06-07 ENCOUNTER — Other Ambulatory Visit: Payer: Self-pay

## 2024-06-12 ENCOUNTER — Other Ambulatory Visit: Payer: Self-pay
